# Patient Record
Sex: Female | Born: 1955 | Race: White | Hispanic: No | Smoking: Former smoker
Health system: Southern US, Community
[De-identification: ages and names within clinical notes are randomized; demographics above are authoritative.]

## PROBLEM LIST (undated history)

## (undated) DIAGNOSIS — H9190 Unspecified hearing loss, unspecified ear: Secondary | ICD-10-CM

## (undated) DIAGNOSIS — N816 Rectocele: Secondary | ICD-10-CM

## (undated) DIAGNOSIS — F319 Bipolar disorder, unspecified: Secondary | ICD-10-CM

## (undated) DIAGNOSIS — E039 Hypothyroidism, unspecified: Secondary | ICD-10-CM

## (undated) DIAGNOSIS — K5900 Constipation, unspecified: Secondary | ICD-10-CM

## (undated) DIAGNOSIS — F329 Major depressive disorder, single episode, unspecified: Secondary | ICD-10-CM

## (undated) DIAGNOSIS — G2581 Restless legs syndrome: Secondary | ICD-10-CM

## (undated) DIAGNOSIS — F32A Depression, unspecified: Secondary | ICD-10-CM

## (undated) HISTORY — PX: BLADDER SURGERY: SHX569

## (undated) HISTORY — DX: Unspecified hearing loss, unspecified ear: H91.90

## (undated) HISTORY — DX: Rectocele: N81.6

## (undated) HISTORY — DX: Constipation, unspecified: K59.00

## (undated) HISTORY — DX: Major depressive disorder, single episode, unspecified: F32.9

## (undated) HISTORY — DX: Restless legs syndrome: G25.81

## (undated) HISTORY — DX: Hypothyroidism, unspecified: E03.9

## (undated) HISTORY — DX: Depression, unspecified: F32.A

## (undated) HISTORY — PX: TUBAL LIGATION: SHX77

## (undated) HISTORY — PX: COLONOSCOPY: SHX174

---

## 1978-03-20 HISTORY — PX: BLADDER SURGERY: SHX569

## 1998-03-02 ENCOUNTER — Encounter: Payer: Self-pay | Admitting: Emergency Medicine

## 1998-03-02 ENCOUNTER — Emergency Department (HOSPITAL_COMMUNITY): Admission: EM | Admit: 1998-03-02 | Discharge: 1998-03-02 | Payer: Self-pay | Admitting: Emergency Medicine

## 1998-06-25 ENCOUNTER — Other Ambulatory Visit: Admission: RE | Admit: 1998-06-25 | Discharge: 1998-06-25 | Payer: Self-pay | Admitting: *Deleted

## 1999-09-09 ENCOUNTER — Encounter: Admission: RE | Admit: 1999-09-09 | Discharge: 1999-09-09 | Payer: Self-pay | Admitting: Family Medicine

## 1999-09-09 ENCOUNTER — Encounter: Payer: Self-pay | Admitting: Family Medicine

## 1999-10-14 ENCOUNTER — Emergency Department (HOSPITAL_COMMUNITY): Admission: EM | Admit: 1999-10-14 | Discharge: 1999-10-14 | Payer: Self-pay | Admitting: Emergency Medicine

## 1999-10-14 ENCOUNTER — Encounter: Payer: Self-pay | Admitting: Emergency Medicine

## 2000-03-08 ENCOUNTER — Other Ambulatory Visit: Admission: RE | Admit: 2000-03-08 | Discharge: 2000-03-08 | Payer: Self-pay | Admitting: Obstetrics and Gynecology

## 2000-10-10 ENCOUNTER — Encounter: Admission: RE | Admit: 2000-10-10 | Discharge: 2000-10-10 | Payer: Self-pay | Admitting: Family Medicine

## 2000-10-10 ENCOUNTER — Encounter: Payer: Self-pay | Admitting: Family Medicine

## 2001-06-07 ENCOUNTER — Other Ambulatory Visit: Admission: RE | Admit: 2001-06-07 | Discharge: 2001-06-07 | Payer: Self-pay | Admitting: Obstetrics & Gynecology

## 2001-11-29 ENCOUNTER — Encounter: Admission: RE | Admit: 2001-11-29 | Discharge: 2001-11-29 | Payer: Self-pay | Admitting: Family Medicine

## 2001-11-29 ENCOUNTER — Encounter: Payer: Self-pay | Admitting: Family Medicine

## 2002-08-22 ENCOUNTER — Other Ambulatory Visit: Admission: RE | Admit: 2002-08-22 | Discharge: 2002-08-22 | Payer: Self-pay | Admitting: Obstetrics & Gynecology

## 2003-01-02 ENCOUNTER — Encounter: Payer: Self-pay | Admitting: Family Medicine

## 2003-01-02 ENCOUNTER — Encounter: Admission: RE | Admit: 2003-01-02 | Discharge: 2003-01-02 | Payer: Self-pay | Admitting: Family Medicine

## 2003-09-11 ENCOUNTER — Ambulatory Visit (HOSPITAL_COMMUNITY): Admission: RE | Admit: 2003-09-11 | Discharge: 2003-09-11 | Payer: Self-pay | Admitting: Obstetrics & Gynecology

## 2003-09-11 ENCOUNTER — Encounter (INDEPENDENT_AMBULATORY_CARE_PROVIDER_SITE_OTHER): Payer: Self-pay | Admitting: Specialist

## 2004-07-15 ENCOUNTER — Encounter: Admission: RE | Admit: 2004-07-15 | Discharge: 2004-07-15 | Payer: Self-pay | Admitting: Obstetrics & Gynecology

## 2004-11-12 ENCOUNTER — Emergency Department (HOSPITAL_COMMUNITY): Admission: EM | Admit: 2004-11-12 | Discharge: 2004-11-12 | Payer: Self-pay | Admitting: Emergency Medicine

## 2005-07-21 ENCOUNTER — Encounter: Admission: RE | Admit: 2005-07-21 | Discharge: 2005-07-21 | Payer: Self-pay | Admitting: Obstetrics & Gynecology

## 2005-08-25 ENCOUNTER — Encounter: Admission: RE | Admit: 2005-08-25 | Discharge: 2005-08-25 | Payer: Self-pay | Admitting: Obstetrics & Gynecology

## 2006-07-27 ENCOUNTER — Encounter: Admission: RE | Admit: 2006-07-27 | Discharge: 2006-07-27 | Payer: Self-pay | Admitting: Obstetrics & Gynecology

## 2007-09-27 ENCOUNTER — Encounter: Admission: RE | Admit: 2007-09-27 | Discharge: 2007-09-27 | Payer: Self-pay | Admitting: Obstetrics & Gynecology

## 2007-10-11 ENCOUNTER — Encounter: Admission: RE | Admit: 2007-10-11 | Discharge: 2007-10-11 | Payer: Self-pay | Admitting: Obstetrics & Gynecology

## 2009-02-05 ENCOUNTER — Encounter: Admission: RE | Admit: 2009-02-05 | Discharge: 2009-02-05 | Payer: Self-pay | Admitting: Obstetrics & Gynecology

## 2009-08-11 ENCOUNTER — Emergency Department (HOSPITAL_COMMUNITY): Admission: EM | Admit: 2009-08-11 | Discharge: 2009-08-12 | Payer: Self-pay | Admitting: Emergency Medicine

## 2009-08-12 ENCOUNTER — Inpatient Hospital Stay (HOSPITAL_COMMUNITY): Admission: AD | Admit: 2009-08-12 | Discharge: 2009-08-16 | Payer: Self-pay | Admitting: Psychiatry

## 2009-08-12 ENCOUNTER — Ambulatory Visit: Payer: Self-pay | Admitting: Psychiatry

## 2010-01-07 ENCOUNTER — Ambulatory Visit (HOSPITAL_COMMUNITY): Admission: RE | Admit: 2010-01-07 | Discharge: 2010-01-07 | Payer: Self-pay | Admitting: Surgery

## 2010-02-11 ENCOUNTER — Encounter: Admission: RE | Admit: 2010-02-11 | Discharge: 2010-02-11 | Payer: Self-pay | Admitting: Obstetrics & Gynecology

## 2010-02-25 ENCOUNTER — Encounter
Admission: RE | Admit: 2010-02-25 | Discharge: 2010-02-25 | Payer: Self-pay | Source: Home / Self Care | Attending: Obstetrics & Gynecology | Admitting: Obstetrics & Gynecology

## 2010-04-10 ENCOUNTER — Encounter: Payer: Self-pay | Admitting: Obstetrics & Gynecology

## 2010-04-11 ENCOUNTER — Encounter: Payer: Self-pay | Admitting: Obstetrics & Gynecology

## 2010-05-27 ENCOUNTER — Other Ambulatory Visit: Payer: Self-pay | Admitting: Obstetrics & Gynecology

## 2010-06-01 LAB — DIFFERENTIAL
Eosinophils Absolute: 0.2 10*3/uL (ref 0.0–0.7)
Lymphocytes Relative: 37 % (ref 12–46)
Lymphs Abs: 2.5 10*3/uL (ref 0.7–4.0)
Monocytes Relative: 7 % (ref 3–12)
Neutrophils Relative %: 52 % (ref 43–77)

## 2010-06-01 LAB — COMPREHENSIVE METABOLIC PANEL
ALT: 14 U/L (ref 0–35)
AST: 25 U/L (ref 0–37)
Calcium: 9.8 mg/dL (ref 8.4–10.5)
GFR calc Af Amer: >60 mL/min (ref >60)
Sodium: 141 mEq/L (ref 135–145)
Total Protein: 6.9 g/dL (ref 6.0–8.3)

## 2010-06-01 LAB — CBC
MCHC: 33.9 g/dL (ref 30.0–36.0)
RDW: 13.1 % (ref 11.5–15.5)

## 2010-06-01 LAB — SURGICAL PCR SCREEN: MRSA, PCR: NEGATIVE

## 2010-06-06 LAB — BASIC METABOLIC PANEL
GFR calc Af Amer: >60 mL/min (ref >60)
GFR calc non Af Amer: >60 mL/min (ref >60)
Glucose, Bld: 106 mg/dL — ABNORMAL HIGH (ref 70–99)
Potassium: 3.9 mEq/L (ref 3.5–5.1)
Sodium: 142 mEq/L (ref 135–145)

## 2010-06-06 LAB — RAPID URINE DRUG SCREEN, HOSP PERFORMED
Amphetamines: NOT DETECTED
Benzodiazepines: NOT DETECTED
Cocaine: NOT DETECTED
Tetrahydrocannabinol: NOT DETECTED

## 2010-06-06 LAB — DIFFERENTIAL
Eosinophils Relative: 2 % (ref 0–5)
Lymphocytes Relative: 30 % (ref 12–46)
Lymphs Abs: 2.8 10*3/uL (ref 0.7–4.0)
Monocytes Absolute: 0.7 10*3/uL (ref 0.1–1.0)
Monocytes Relative: 7 % (ref 3–12)
Neutro Abs: 5.8 10*3/uL (ref 1.7–7.7)

## 2010-06-06 LAB — CBC
HCT: 44.8 % (ref 36.0–46.0)
Hemoglobin: 14.9 g/dL (ref 12.0–15.0)
RBC: 4.85 MIL/uL (ref 3.87–5.11)
RDW: 13.5 % (ref 11.5–15.5)
WBC: 9.5 10*3/uL (ref 4.0–10.5)

## 2010-06-06 LAB — TRICYCLICS SCREEN, URINE: TCA Scrn: NOT DETECTED

## 2010-08-05 NOTE — Op Note (Signed)
NAME:  Kara Webster, Kara Webster                          ACCOUNT NO.:  [id number]   MEDICAL RECORD NO.:  [id number]                   PATIENT TYPE:  AMB   LOCATION:  SDC                                  FACILITY:  [facility]   PHYSICIAN:  Genia Del, M.D.             DATE OF BIRTH:  [birth year]   DATE OF PROCEDURE:  09/11/2003  DATE OF DISCHARGE:                                 OPERATIVE REPORT   PREOPERATIVE DIAGNOSIS:  Menometrorrhagia with probable intrauterine  lesions.   POSTOPERATIVE DIAGNOSES:  1. Menometrorrhagia with probable intrauterine lesions.  2. Probable endometrial polyp.   SURGEON:  Genia Del, M.D.   ANESTHESIOLOGIST:  Quillian Quince, M.D.   PROCEDURE:  Hysteroscopy with resection of intrauterine lesion and D&C.   DESCRIPTION OF PROCEDURE:  Under general anesthesia with endotracheal  intubation, the patient is in the lithotomy position.  She is prepped with  Hibiclens on the suprapubic, vulvar and vaginal areas and draped as usual.  The vaginal examination reveals a retroverted uterus, normal volume, no  adnexal mass.  We insert the speculum inside the vagina.  The anterior lip  of the cervix is grasped with the tenaculum.  A paracervical block and  cervical block are done with Nesacaine 1% plain, a total of 20 mL, at 4 and  8 o'clock.  We then dilate the cervix up to Hegar #27 without difficulty.  A  diagnostic hysteroscopy is done showing a left low anterior polyp in the  intrauterine cavity.  It measures about 1 cm to 1.5 cm in diameter.  The  rest of the cavity is normal. The ostia are well seen and pictures are  taken.  We remove the diagnostic hysteroscope and complete dilatation with  Hegar dilators up to #35.  This is done without difficulty. We then insert  the operative hysteroscope inside the intrauterine cavity with a double  loop.  The intrauterine lesion is resected at the base and sent to  pathology.  We then visualize the intrauterine cavity  again, pictures are  taken.  The intrauterine cavity appears completely normal.  We then use a  sharp curette to proceed with a systematic curettage of all intrauterine  surfaces.  The specimen is sent separately to pathology.  The instruments  are then removed.  Hemostasis is good.  The estimated blood loss was  minimal.  The fluid deficit was 40 mL.  No complications occurred and the  patient was transferred to the recovery room in good condition.                                               Genia Del, M.D.    ML/MEDQ  D:  09/11/2003  T:  09/11/2003  Job:  [id number]

## 2011-01-26 ENCOUNTER — Other Ambulatory Visit: Payer: Self-pay | Admitting: Obstetrics & Gynecology

## 2011-01-26 DIAGNOSIS — Z1231 Encounter for screening mammogram for malignant neoplasm of breast: Secondary | ICD-10-CM

## 2011-03-03 ENCOUNTER — Ambulatory Visit
Admission: RE | Admit: 2011-03-03 | Discharge: 2011-03-03 | Disposition: A | Discharge status: Home / Self Care | Payer: BC Managed Care – PPO | Source: Ambulatory Visit | Attending: Obstetrics & Gynecology | Admitting: Obstetrics & Gynecology

## 2011-03-03 DIAGNOSIS — Z1231 Encounter for screening mammogram for malignant neoplasm of breast: Secondary | ICD-10-CM

## 2011-03-09 ENCOUNTER — Other Ambulatory Visit: Payer: Self-pay | Admitting: Obstetrics & Gynecology

## 2011-03-09 DIAGNOSIS — R928 Other abnormal and inconclusive findings on diagnostic imaging of breast: Secondary | ICD-10-CM

## 2011-03-24 ENCOUNTER — Ambulatory Visit
Admission: RE | Admit: 2011-03-24 | Discharge: 2011-03-24 | Disposition: A | Discharge status: Home / Self Care | Payer: BC Managed Care – PPO | Source: Ambulatory Visit | Attending: Obstetrics & Gynecology | Admitting: Obstetrics & Gynecology

## 2011-03-24 DIAGNOSIS — R928 Other abnormal and inconclusive findings on diagnostic imaging of breast: Secondary | ICD-10-CM

## 2012-04-02 ENCOUNTER — Other Ambulatory Visit: Payer: Self-pay | Admitting: Obstetrics & Gynecology

## 2012-04-02 DIAGNOSIS — Z1231 Encounter for screening mammogram for malignant neoplasm of breast: Secondary | ICD-10-CM

## 2012-04-05 ENCOUNTER — Ambulatory Visit
Admission: RE | Admit: 2012-04-05 | Discharge: 2012-04-05 | Disposition: A | Discharge status: Home / Self Care | Payer: BC Managed Care – PPO | Source: Ambulatory Visit | Attending: Obstetrics & Gynecology | Admitting: Obstetrics & Gynecology

## 2012-04-05 DIAGNOSIS — Z1231 Encounter for screening mammogram for malignant neoplasm of breast: Secondary | ICD-10-CM

## 2013-03-12 ENCOUNTER — Other Ambulatory Visit: Payer: Self-pay

## 2013-03-12 DIAGNOSIS — Z1231 Encounter for screening mammogram for malignant neoplasm of breast: Secondary | ICD-10-CM

## 2013-04-11 ENCOUNTER — Ambulatory Visit
Admission: RE | Admit: 2013-04-11 | Discharge: 2013-04-11 | Disposition: A | Discharge status: Home / Self Care | Payer: BC Managed Care – PPO | Source: Ambulatory Visit

## 2013-04-11 DIAGNOSIS — Z1231 Encounter for screening mammogram for malignant neoplasm of breast: Secondary | ICD-10-CM

## 2013-05-27 ENCOUNTER — Ambulatory Visit (INDEPENDENT_AMBULATORY_CARE_PROVIDER_SITE_OTHER): Payer: BC Managed Care – PPO | Admitting: Internal Medicine

## 2013-05-27 VITALS — BP 120/78 | HR 70 | Temp 97.5°F | Resp 18

## 2013-05-27 DIAGNOSIS — H538 Other visual disturbances: Secondary | ICD-10-CM

## 2013-05-27 DIAGNOSIS — F1911 Other psychoactive substance abuse, in remission: Secondary | ICD-10-CM

## 2013-05-27 DIAGNOSIS — F39 Unspecified mood [affective] disorder: Secondary | ICD-10-CM

## 2013-05-27 DIAGNOSIS — R51 Headache: Secondary | ICD-10-CM

## 2013-05-27 DIAGNOSIS — R112 Nausea with vomiting, unspecified: Secondary | ICD-10-CM

## 2013-05-27 DIAGNOSIS — R1013 Epigastric pain: Secondary | ICD-10-CM

## 2013-05-27 DIAGNOSIS — F1910 Other psychoactive substance abuse, uncomplicated: Secondary | ICD-10-CM

## 2013-05-27 LAB — POCT CBC
GRANULOCYTE PERCENT: 72.8 % (ref 37–80)
HCT, POC: 41.7 % (ref 37.7–47.9)
HEMOGLOBIN: 13.1 g/dL (ref 12.2–16.2)
LYMPH, POC: 1.8 (ref 0.6–3.4)
MCH, POC: 27.9 pg (ref 27–31.2)
MCHC: 31.4 g/dL — AB (ref 31.8–35.4)
MCV: 88.8 fL (ref 80–97)
MID (cbc): 0.5 (ref 0–0.9)
MPV: 7.5 fL (ref 0–99.8)
POC GRANULOCYTE: 6 (ref 2–6.9)
POC LYMPH PERCENT: 21.7 %L (ref 10–50)
POC MID %: 5.5 % (ref 0–12)
Platelet Count, POC: 430 10*3/uL — AB (ref 142–424)
RBC: 4.7 M/uL (ref 4.04–5.48)
RDW, POC: 14.7 %
WBC: 8.3 10*3/uL (ref 4.6–10.2)

## 2013-05-27 LAB — COMPREHENSIVE METABOLIC PANEL
ALBUMIN: 4.2 g/dL (ref 3.5–5.2)
ALT: 8 U/L (ref 0–35)
AST: 19 U/L (ref 0–37)
Alkaline Phosphatase: 62 U/L (ref 39–117)
BUN: 12 mg/dL (ref 6–23)
CHLORIDE: 101 meq/L (ref 96–112)
CO2: 31 meq/L (ref 19–32)
CREATININE: 0.75 mg/dL (ref 0.50–1.10)
Calcium: 9.8 mg/dL (ref 8.4–10.5)
Glucose, Bld: 102 mg/dL — ABNORMAL HIGH (ref 70–99)
POTASSIUM: 4.8 meq/L (ref 3.5–5.3)
Sodium: 141 mEq/L (ref 135–145)
Total Bilirubin: 0.5 mg/dL (ref 0.2–1.2)
Total Protein: 6.8 g/dL (ref 6.0–8.3)

## 2013-05-27 LAB — POCT UA - MICROSCOPIC ONLY
CASTS, UR, LPF, POC: NEGATIVE
Crystals, Ur, HPF, POC: NEGATIVE
Mucus, UA: NEGATIVE
WBC, Ur, HPF, POC: NEGATIVE
Yeast, UA: NEGATIVE

## 2013-05-27 LAB — POCT URINALYSIS DIPSTICK
Bilirubin, UA: NEGATIVE
Blood, UA: NEGATIVE
GLUCOSE UA: NEGATIVE
KETONES UA: NEGATIVE
LEUKOCYTES UA: NEGATIVE
NITRITE UA: NEGATIVE
Protein, UA: NEGATIVE
Spec Grav, UA: 1.01
Urobilinogen, UA: 0.2
pH, UA: 7

## 2013-05-27 LAB — GLUCOSE, POCT (MANUAL RESULT ENTRY): POC Glucose: 113 mg/dl — AB (ref 70–99)

## 2013-05-27 LAB — TSH: TSH: 2.184 u[IU]/mL (ref 0.350–4.500)

## 2013-05-27 MED ORDER — ONDANSETRON HCL 4 MG PO TABS: 4.0000 mg | ORAL_TABLET | Freq: Three times a day (TID) | ORAL | Status: DC | PRN

## 2013-05-27 MED ORDER — LAMOTRIGINE 100 MG PO TABS: 100.0000 mg | ORAL_TABLET | Freq: Every day | ORAL | Status: DC

## 2013-05-27 NOTE — Progress Notes (Addendum)
Subjective:   This chart was scribed for Kara Lin, MD, by Neta Ehlers, scribe. The pt's care was started at 12:27 pm.    Patient ID: Kara Webster, female    DOB: 30-Apr-1955, 58 y.o.   MRN: [id number]  Chief Complaint  Patient presents with   Headache    very bad   Abdominal Pain    epigastric   Chills  Called to see patient urgently due to unusual symptoms   HPI  Kara Webster is a 58 y.o. female who presents to [facility] complaining of abdominal pain which began three days ago. She characterizes the abdominal pain as "knife-like." She also experienced one day of nausea and emesis as well as  diarrhea. Additionally, she has experienced night sweats. The emesis and diarrhea have resolved, but the abdominal pain and nausea continue. Aside from the episode of diarrhea, she has had regular BMs.   She also has experienced a constant, generalized headache which also began three days ago. The headache is associated with blurred vision, altho she was able to drive without problems to get here.  She denies a stiff neck, fever, cough, SOB, dysuria, difficulty urinating, rash, or otalgia.   Pt takes Naltrexone, Abilify ,Cymbalta, Lamictal, Estradiol, and Progesterone/estradiol. She has not had any new medications;  Lajuana Ripple prescribes her medications. Naltrexone has helped her avoid substances for several years. She has recently renewed Lamictal last week and Naltrexone last month; she has recently switched pharmacies. She reports both medications looked different with the renewals.   The pt ate crackers earlier today; the pain was not worsened with eating. She has not used antacids to treat the pain.   Two weeks ago, she took castor beans. She denies experiencing any immediate effect after taking the castor beans.   She uses [facility] and is due for an eye exam.  Review of Systems  Constitutional: Positive for appetite change. Negative for fever, chills,  activity change and unexpected weight change.       Night sweats  HENT: Negative for ear pain, sore throat and trouble swallowing.   Eyes: Positive for visual disturbance. Negative for photophobia and redness.  Respiratory: Negative for cough and shortness of breath.   Cardiovascular: Negative for chest pain, palpitations and leg swelling.  Gastrointestinal: Positive for nausea, vomiting, abdominal pain and diarrhea. Negative for constipation.  Genitourinary: Negative for dysuria, frequency, difficulty urinating and menstrual problem.       [name]  Musculoskeletal: Negative for joint swelling, myalgias, neck pain and neck stiffness.  Neurological: Positive for headaches. Negative for dizziness, tremors and weakness.  Psychiatric/Behavioral: Positive for sleep disturbance and dysphoric mood. Negative for confusion. The patient is nervous/anxious.       Objective:   Physical Exam  Nursing note and vitals reviewed. Constitutional: She is oriented to person, place, and time. She appears well-developed and well-nourished. No distress.  HENT:  Head: Normocephalic and atraumatic.  Right Ear: External ear normal.  Left Ear: External ear normal.  Nose: Nose normal.  Mouth/Throat: Oropharynx is clear and moist.  Eyes: Conjunctivae and EOM are normal. Pupils are equal, round, and reactive to light.  Neck: Normal range of motion. Neck supple. No thyromegaly present.  Cardiovascular: Normal rate, regular rhythm, normal heart sounds and intact distal pulses.   No murmur heard. Pulmonary/Chest: Effort normal. No respiratory distress. She exhibits no tenderness.  Abdominal: Soft. Bowel sounds are normal. She exhibits no mass. There is no guarding.  Tender in the epigastrium,  but no rebound.   Musculoskeletal: Normal range of motion. She exhibits no edema.  Lymphadenopathy:    She has no cervical adenopathy.  Neurological: She is alert and oriented to person, place, and time. She has normal  reflexes. No cranial nerve deficit. Coordination normal.  Fundi reveal no papilledema. Cranial nerves 2-12 intact. Visual field confrontation normal. Rhomberg negative. Deep tendon reflexes symmetric. No peripheral motor or sensory loss. No nystagmus with movement.   Skin: Skin is warm and dry. No rash noted.  Psychiatric: She has a normal mood and affect. Her behavior is normal. Judgment and thought content normal.   gait normal  Triage Vitals: BP 120/78   Pulse 70   Temp(Src) 97.5 F (36.4 C) (Oral)   Resp 18   SpO2 99% Glucose normal CBC within normal limits    Assessment & Plan:   12:45 PM- Discussed treatment plan with patient, which includes lab work, and the patient agreed to the plan.   1:12 PM- Rechecked pt.   1:31 PM- Rechecked pt's vision. She reports right is more blurred than left. Vision 20/20. Fundi benign  Headache(784.0) - Plan: POCT CBC, POCT urinalysis dipstick, POCT UA - Microscopic Only, Comprehensive metabolic panel  Abdominal pain, epigastric - Plan: POCT CBC, POCT urinalysis dipstick, POCT UA - Microscopic Only, Comprehensive metabolic panel  Nausea with vomiting - Plan: POCT CBC, POCT urinalysis dipstick, POCT UA - Microscopic Only, Comprehensive metabolic panel, ondansetron (ZOFRAN) 4 MG tablet  Blurred vision - Plan: POCT CBC, POCT urinalysis dipstick, POCT UA - Microscopic Only, Comprehensive metabolic panel, TSH, POCT glucose (manual entry)  Episodic mood disorder  History of substance abuse  In retrospect she believes her symptoms started soon after her change in the generic form of her Lamictal She also possibly could have an acute viral gastroenteritis as well  For now we will treat with Zofran, and have her resume the former generic Lamictal for the next 2 weeks She'll go ahead with ophthalmology evaluation Use Tylenol for headache Followup here immediately if worse   Addendum Labs Results for orders placed in visit on 05/27/13    COMPREHENSIVE METABOLIC PANEL      Result Value Ref Range   Sodium 141  135 - 145 mEq/L   Potassium 4.8  3.5 - 5.3 mEq/L   Chloride 101  96 - 112 mEq/L   CO2 31  19 - 32 mEq/L   Glucose, Bld 102 (*) 70 - 99 mg/dL   BUN 12  6 - 23 mg/dL   Creat 0.75  0.50 - 1.10 mg/dL   Total Bilirubin 0.5  0.2 - 1.2 mg/dL   Alkaline Phosphatase 62  39 - 117 U/L   AST 19  0 - 37 U/L   ALT 8  0 - 35 U/L   Total Protein 6.8  6.0 - 8.3 g/dL   Albumin 4.2  3.5 - 5.2 g/dL   Calcium 9.8  8.4 - 10.5 mg/dL  TSH      Result Value Ref Range   TSH 2.184  0.350 - 4.500 uIU/mL  POCT CBC      Result Value Ref Range   WBC 8.3  4.6 - 10.2 K/uL   Lymph, poc 1.8  0.6 - 3.4   POC LYMPH PERCENT 21.7  10 - 50 %L   MID (cbc) 0.5  0 - 0.9   POC MID % 5.5  0 - 12 %M   POC Granulocyte 6.0  2 - 6.9   Granulocyte percent  72.8  37 - 80 %G   RBC 4.70  4.04 - 5.48 M/uL   Hemoglobin 13.1  12.2 - 16.2 g/dL   HCT, POC 41.7  37.7 - 47.9 %   MCV 88.8  80 - 97 fL   MCH, POC 27.9  27 - 31.2 pg   MCHC 31.4 (*) 31.8 - 35.4 g/dL   RDW, POC 14.7     Platelet Count, POC 430 (*) 142 - 424 K/uL   MPV 7.5  0 - 99.8 fL  POCT URINALYSIS DIPSTICK      Result Value Ref Range   Color, UA yellow     Clarity, UA clear     Glucose, UA neg     Bilirubin, UA neg     Ketones, UA neg     Spec Grav, UA 1.010     Blood, UA neg     pH, UA 7.0     Protein, UA neg     Urobilinogen, UA 0.2     Nitrite, UA neg     Leukocytes, UA Negative    POCT UA - MICROSCOPIC ONLY      Result Value Ref Range   WBC, Ur, HPF, POC neg     RBC, urine, microscopic 0-1     Bacteria, U Microscopic 1+     Mucus, UA neg     Epithelial cells, urine per micros 0-2     Crystals, Ur, HPF, POC neg     Casts, Ur, LPF, POC neg     Yeast, UA neg    GLUCOSE, POCT (MANUAL RESULT ENTRY)      Result Value Ref Range   POC Glucose 113 (*) 70 - 99 mg/dl   50 minute office visit

## 2013-05-28 ENCOUNTER — Encounter: Payer: Self-pay | Admitting: Internal Medicine

## 2013-05-28 DIAGNOSIS — F1911 Other psychoactive substance abuse, in remission: Secondary | ICD-10-CM | POA: Insufficient documentation

## 2013-05-28 DIAGNOSIS — F39 Unspecified mood [affective] disorder: Secondary | ICD-10-CM | POA: Insufficient documentation

## 2013-09-23 ENCOUNTER — Ambulatory Visit (INDEPENDENT_AMBULATORY_CARE_PROVIDER_SITE_OTHER): Payer: BC Managed Care – PPO | Admitting: General Surgery

## 2013-10-07 ENCOUNTER — Encounter (INDEPENDENT_AMBULATORY_CARE_PROVIDER_SITE_OTHER): Payer: Self-pay | Admitting: General Surgery

## 2013-10-07 ENCOUNTER — Ambulatory Visit (INDEPENDENT_AMBULATORY_CARE_PROVIDER_SITE_OTHER): Payer: BC Managed Care – PPO | Admitting: General Surgery

## 2013-10-07 VITALS — BP 124/77 | HR 80 | Temp 98.6°F | Resp 16 | Ht 64.0 in | Wt 154.2 lb

## 2013-10-07 DIAGNOSIS — K5900 Constipation, unspecified: Secondary | ICD-10-CM | POA: Insufficient documentation

## 2013-10-07 DIAGNOSIS — R150 Incomplete defecation: Secondary | ICD-10-CM

## 2013-10-07 NOTE — Patient Instructions (Addendum)
Continue a high fiber diet.  Recommend colonoscopy for colorectal cancer screening.    Bowel Incontinence  What is incontinence? Incontinence is the impaired ability to control gas or stool. Its severity ranges from mild difficulty with gas control to severe loss of control over liquid and formed stools. Incontinence to stool is a common problem, but often it is not discussed due to embarassment. What causes incontinence? There are many causes of incontinence. Injury during childbirth is one of the most common causes. These injuries may cause a tear in the anal muscles. The nerves supplying the anal muscles may also be injured. While some injuries may be recognized immediately following childbirth, many others may go unnoticed and not become a problem until later in life. In these situations, a prior childbirth may not be recognized as the cause of incontinence. Anal operations or traumatic injury to the tissue surrounding the anal region similarly can damage the anal muscles and hinder bowel control. Some individuals experience loss of strength in the anal muscles as they age. As a result, a minor control problem in a younger person may become more significant later in life. Diarrhea may be associated with a feeling of urgency or stool leakage due to the frequent liquid stools passing through the anal opening. If bleeding accompanies your bowel movements or you have lack of bowel control, consult your physician. These symptoms may indicate inflammation within the colon (colitis), a rectal tumor or rectal prolapse - all conditions that require prompt evaluation by a physician.    How is the cause of incontinence determined? An initial discussion of the problem with your physician will help establish the degree of control difficulty and its impact on your lifestyle. Many clues to the origin of incontinence may be found in patient histories. For example, a woman's history of past childbirths is very  important. Multiple pregnancies, large weight babies, forceps deliveries, or episiotomies may contribute to muscle or nerve injury at the time of childbirth. In some cases, medical illnesses and medications play a role in problems with control. A physical exam of the anal region should be performed. It may readily identify an obvious injury to the anal muscles. In addition, an ultrasound probe can be used within the anal area to provide a picture of the muscles and show areas in which the anal muscles have been injured. Frequently, additional studies are required to define the anal area more completely. In a test called anal manometry, a small catheter is placed into the anus to record pressure as patients relax and tighten the anal muscles. This test can demonstrate how weak or strong the muscle really is. A separate test may also be conducted to determine if the nerves that go to the anal muscles are functioning properly. What can be done to correct the problem? Treatment of incontinence may include: Marland Kitchen     Dietary changes .     Constipating medications .     Muscle strengthening exercises .     Biofeedback  Injectable bulking agents  Surgical muscle repair  Artificial anal sphincter  Sacral nerve stimulator  After a careful history, physical examination and testing to determine the cause and severity of the problem, treatment can be addressed. Mild problems may be treated very simply with dietary changes and the use of some constipating medications. Diseases which cause inflammation in the rectum, such as colitis, may contribute to anal control problems. Treating these diseases also may eliminate or improve symptoms of incontinence. Sometimes a change  in prescribed medications may help. Your physician also may recommend simple home exercises that may strengthen the anal muscles to help in mild cases. A type of physical therapy called biofeedback can be used to help patients sense when stool is  ready to be evacuated and help strengthen the muscles. Injuries to the anal muscles may be repaired with surgery. Some individuals may benefit from a technique that delivers electrical energy to the skin and muscles surrounding the anus which results in firming and thickening of this area to help with continence. In certain individuals that have nerve damage or anal muscles that are damaged beyond repair, an artificial sphincter may be implanted. The artificial sphincter is a plastic, fluid filled doughnut that is surgically implanted around the damaged anal sphincter. This artificial sphincter keeps the anal canal closed. When an individual wants to have a bowel movement, the fluid can be pumped out of the doughnut to allow the anal canal to open. In extreme cases, patients may find that a colostomy is the best option for improving their quality of life. What is a colon and rectal surgeon? Colon and rectal surgeons are experts in the surgical and non-surgical treatment of diseases of the colon, rectum and anus. They have completed advanced surgical training in the treatment of these diseases as well as full general surgical training. Board-certified colon and rectal surgeons complete residencies in general surgery and colon and rectal surgery, and pass intensive examinations conducted by the American Board of Surgery and the American Board of Colon and Rectal Surgery. They are well-versed in the treatment of both benign and malignant diseases of the colon, rectum and anus and are able to perform routine screening examinations and surgically treat conditions if indicated to do so.  2012 American Society of Colon & Rectal Surgeons   Fiber Chart  You should 25-30g of fiber per day and drinking 8 glasses of water to help your bowels move regularly.  In the chart below you can look up how much fiber you are getting in an average day.  If you are not getting enough fiber, you should add a fiber supplement to  your diet.  Examples of this include Metamucil, FiberCon and Citrucel.  These can be purchased at your local grocery store or pharmacy.      [email or web address]-guerrero.[email or web address]

## 2013-10-07 NOTE — Progress Notes (Signed)
Chief Complaint  Patient presents with  . Rectal Problems    HISTORY: Kara Webster is a 58 y.o. female who presents to the office with fecal incontinence.  This had been occurring for about a year.   Constipation makes the symptoms worse.  She has a feeling of incomplete evacuation.  It is intermittent in nature.  her bowel habits are somewhat irregular and her bowel movements are sometimes hard.  her fiber intake is dietary.  her last colonoscopy was about 5 years ago in [place].  she has had 2 vaginal deliveries, but denies any birth trauma.  she is incontinent to soft stool but not air.  History reviewed. No pertinent past medical history.    Past Surgical History  Procedure Laterality Date  . Bladder surgery          Current Outpatient Prescriptions  Medication Sig Dispense Refill  . ARIPiprazole (ABILIFY) 2 MG tablet Take 2 mg by mouth daily.      . DULoxetine (CYMBALTA) 60 MG capsule Take 60 mg by mouth daily.      Marland Kitchen estradiol (VIVELLE-DOT) 0.075 MG/24HR Place 1 patch onto the skin 2 (two) times a week.      . lamoTRIgine (LAMICTAL) 100 MG tablet Take 1 tablet (100 mg total) by mouth daily.  14 tablet  0  . NALTREXONE HCL PO Take 1 tablet by mouth daily.      . progesterone (PROMETRIUM) 100 MG capsule Take 100 mg by mouth daily.       No current facility-administered medications for this visit.      Allergies  Allergen Reactions  . Codeine Nausea And Vomiting      History reviewed. No pertinent family history.  History   Social History  . Marital Status: Divorced    Spouse Name: N/A    Number of Children: N/A  . Years of Education: N/A   Social History Main Topics  . Smoking status: Never Smoker   . Smokeless tobacco: None  . Alcohol Use: No  . Drug Use: No  . Sexual Activity: None   Other Topics Concern  . None   Social History Narrative  . None      REVIEW OF SYSTEMS - PERTINENT POSITIVES ONLY: Review of Systems - General ROS: negative for -  chills, fever or weight loss Hematological and Lymphatic ROS: negative for - bleeding problems, blood clots or bruising Respiratory ROS: no cough, shortness of breath, or wheezing Cardiovascular ROS: no chest pain or dyspnea on exertion Gastrointestinal ROS: no abdominal pain, change in bowel habits, or black or bloody stools Genito-Urinary ROS: no dysuria, trouble voiding, or hematuria  EXAM: Filed Vitals:   10/07/13 1113  BP: 124/77  Pulse: 80  Temp: 98.6 F (37 C)  Resp: 16    General appearance: alert and cooperative Resp: clear to auscultation bilaterally Cardio: regular rate and rhythm GI: normal findings: soft, non-tender   Procedure: Anoscopy Surgeon: Marcello Moores Diagnosis: fecal incontinence   Assistant: McPhearson After the risks and benefits were explained, verbal consent was obtained for above procedure  Anesthesia: none Findings: minimal hemorrhoid disease, good resting tone, weak squeeze (esp anteriorly), normal push, mild rectocele    ASSESSMENT AND PLAN: Kara Webster is a 58 y.o. F with some incomplete defecation after episodes of constipation.  Her symptoms are not present when she is not constipated.  I have asked her to treat this with the addition of fiber to her diet with a goal of 25g  of fiber a day.  She will return to the office if things worsen.  I encouraged her to get her repeat colonoscopy scheduled.      Rosario Adie, MD Colon and Rectal Surgery / [facility]      Visit Diagnoses: No diagnosis found.  Primary Care Physician: No primary provider on file.

## 2015-07-19 ENCOUNTER — Encounter: Payer: Self-pay | Admitting: Physician Assistant

## 2015-07-19 ENCOUNTER — Ambulatory Visit (INDEPENDENT_AMBULATORY_CARE_PROVIDER_SITE_OTHER): Payer: PRIVATE HEALTH INSURANCE | Admitting: Physician Assistant

## 2015-07-19 VITALS — BP 116/57 | HR 79 | Temp 98.5°F | Ht 64.0 in | Wt 157.0 lb

## 2015-07-19 DIAGNOSIS — R5383 Other fatigue: Secondary | ICD-10-CM | POA: Diagnosis not present

## 2015-07-19 DIAGNOSIS — N951 Menopausal and female climacteric states: Secondary | ICD-10-CM | POA: Insufficient documentation

## 2015-07-19 DIAGNOSIS — J029 Acute pharyngitis, unspecified: Secondary | ICD-10-CM | POA: Diagnosis not present

## 2015-07-19 LAB — POCT RAPID STREP A (OFFICE): Rapid Strep A Screen: NEGATIVE

## 2015-07-19 NOTE — Progress Notes (Signed)
   Subjective:    Patient ID: Kara Webster, female    DOB: [birth year], 58 y.o.   MRN: [id number]  HPI  Pt is a 60 yo female who presents to the clinic to establish care. She woke up yesterday in the middle of the night with ST. She feels like she is swallowing glass. Today she tried dayquil and Robitussin with some benefit. Denies any fever. She does have some light body aches and fatigue. She has a cough that is not productive. No SOB or wheezing. She does have a headache.    .. Active Ambulatory Problems    Diagnosis Date Noted  . Episodic mood disorder ([place]) 05/28/2013  . History of substance abuse 05/28/2013  . Incomplete defecation 10/07/2013   Resolved Ambulatory Problems    Diagnosis Date Noted  . No Resolved Ambulatory Problems   Past Medical History  Diagnosis Date  . Depression    .Marland Kitchen Family History  Problem Relation Age of Onset  . Cancer Mother   . Hypertension Mother   . Cancer Father   . Diabetes Sister   . Hyperlipidemia Sister   . Hypertension Sister   . Depression Brother   . Diabetes Brother   . Hypertension Brother   . Alcohol abuse Maternal Uncle   . Alcohol abuse Paternal Aunt   . Alcohol abuse Maternal Grandfather    .Marland Kitchen Social History   Social History  . Marital Status: Divorced    Spouse Name: N/A  . Number of Children: N/A  . Years of Education: N/A   Occupational History  . Not on file.   Social History Main Topics  . Smoking status: Never Smoker   . Smokeless tobacco: Not on file  . Alcohol Use: No  . Drug Use: No  . Sexual Activity: No   Other Topics Concern  . Not on file   Social History Narrative       Review of Systems    see HPI.  Objective:   Physical Exam  Constitutional: She is oriented to person, place, and time. She appears well-developed and well-nourished.  HENT:  Head: Normocephalic and atraumatic.  Right Ear: External ear normal.  Left Ear: External ear normal.  Nose: Nose normal.  TM's clear  bilaterally.  Oropharynx erythematous without tonisilar swelling or exudate.   Eyes: Conjunctivae are normal. Right eye exhibits no discharge. Left eye exhibits no discharge.  Neck: Normal range of motion. Neck supple.  Tender bilateral anterior cervical adenopathy.   Cardiovascular: Normal rate, regular rhythm and normal heart sounds.   Pulmonary/Chest: Effort normal and breath sounds normal. She has no wheezes.  Lymphadenopathy:    She has cervical adenopathy.  Neurological: She is alert and oriented to person, place, and time.  Psychiatric: She has a normal mood and affect. Her behavior is normal.          Assessment & Plan:  Acute pharyngitis- discussed symptomatic care. HO given. Continue dayquil, robatussin, gargle with salt water, ibuprofen. Rapid strep negative. Follow up as needed or if symptoms worsen.

## 2015-07-19 NOTE — Patient Instructions (Signed)

## 2016-01-14 ENCOUNTER — Ambulatory Visit: Admit: 2016-01-14 | Payer: PRIVATE HEALTH INSURANCE | Admitting: Obstetrics & Gynecology

## 2016-01-14 SURGERY — COLPORRHAPHY, POSTERIOR, FOR RECTOCELE REPAIR
Anesthesia: General

## 2017-03-16 ENCOUNTER — Other Ambulatory Visit: Payer: Self-pay | Admitting: *Deleted

## 2017-04-12 ENCOUNTER — Other Ambulatory Visit: Payer: Self-pay | Admitting: Obstetrics & Gynecology

## 2017-04-12 ENCOUNTER — Other Ambulatory Visit: Payer: Self-pay

## 2017-06-01 ENCOUNTER — Ambulatory Visit (INDEPENDENT_AMBULATORY_CARE_PROVIDER_SITE_OTHER): Payer: PRIVATE HEALTH INSURANCE | Admitting: Obstetrics & Gynecology

## 2017-06-01 ENCOUNTER — Encounter: Payer: Self-pay | Admitting: Obstetrics & Gynecology

## 2017-06-01 VITALS — BP 124/72 | Ht 63.0 in | Wt 164.0 lb

## 2017-06-01 DIAGNOSIS — Z79890 Hormone replacement therapy (postmenopausal): Secondary | ICD-10-CM

## 2017-06-01 DIAGNOSIS — Z13820 Encounter for screening for osteoporosis: Secondary | ICD-10-CM

## 2017-06-01 DIAGNOSIS — R3915 Urgency of urination: Secondary | ICD-10-CM | POA: Diagnosis not present

## 2017-06-01 DIAGNOSIS — Z01419 Encounter for gynecological examination (general) (routine) without abnormal findings: Secondary | ICD-10-CM | POA: Diagnosis not present

## 2017-06-01 MED ORDER — PROGESTERONE MICRONIZED 100 MG PO CAPS: 100.0000 mg | ORAL_CAPSULE | Freq: Every day | ORAL | 4 refills | Status: DC

## 2017-06-01 MED ORDER — ESTRADIOL 0.5 MG PO TABS: 0.5000 mg | ORAL_TABLET | Freq: Every day | ORAL | 4 refills | Status: DC

## 2017-06-01 NOTE — Patient Instructions (Signed)
1. Encounter for routine gynecological examination with Papanicolaou smear of cervix Normal gynecologic exam.  Pap reflex done.  Breast exam normal.  Will schedule screening mammogram at the breast center.  Health labs with family physician.  Colonoscopy 7 years ago.  2. Post-menopause on HRT (hormone replacement therapy) Ran out of medication for hormone replacement therapy a month ago.  Very symptomatic with vasomotor symptoms, mainly hot flashes.  Also complaining of increased muscle aches and anxiety since ran out of hormones.  Wants to start back on it.  Will obtain medical records from [place], but per patient has been on it for about 10 years.  Patient informed of the increased risk of breast cancer after 10 years on hormone replacement therapy.  Also discussed the risk of blood clots and strokes increasing with age on hormone replacement therapy.  Decision by patient to continue on hormone replacement therapy with estradiol 0.5 mg tablet daily and Prometrium 100 mg tablet at bedtime.  In the course of the year, will try to wean the estradiol progressively.  3. Screening for osteoporosis Vitamin D supplements, calcium rich nutrition and regular weightbearing physical activity recommended.  Schedule bone density here. - DG Bone Density; Future  4. Urinary urgency Will try decreasing the caffeine consumption and increasing water intake.  Will also avoid overfilling of the bladder by going to the bathroom more regularly.  If worsening urinary urgency, patient will call back for referral to urology.  Other orders - estradiol (ESTRACE) 0.5 MG tablet; Take 1 tablet (0.5 mg total) by mouth daily. - progesterone (PROMETRIUM) 100 MG capsule; Take 1 capsule (100 mg total) by mouth at bedtime.  Kara Webster, it was a pleasure seeing you today!  I will inform you of your results as soon as they are available.   Menopause and Hormone Replacement Therapy What is hormone replacement therapy? Hormone  replacement therapy (HRT) is the use of artificial (synthetic) hormones to replace hormones that your body stops producing during menopause. Menopause is the normal time of life when menstrual periods stop completely and the ovaries stop producing the female hormones estrogen and progesterone. This lack of hormones can affect your health and cause undesirable symptoms. HRT can relieve some of those symptoms. What are my options for HRT? HRT may consist of the synthetic hormones estrogen and progestin, or it may consist of only estrogen (estrogen-only therapy). You and your health care provider will decide which form of HRT is best for you. If you choose to be on HRT and you have a uterus, estrogen and progestin are usually prescribed. Estrogen-only therapy is used for women who do not have a uterus. Possible options for taking HRT include:  Pills.  Patches.  Gels.  Sprays.  Vaginal cream.  Vaginal rings.  Vaginal inserts.  The amount of hormone(s) that you take and how long you take the hormone(s) varies depending on your individual health. It is important to:  Begin HRT with the lowest possible dosage.  Stop HRT as soon as your health care provider tells you to stop.  Work with your health care provider so that you feel informed and comfortable with your decisions.  What are the benefits of HRT? HRT can reduce the frequency and severity of menopausal symptoms. Benefits of HRT vary depending on the menopausal symptoms that you have, the severity of your symptoms, and your overall health. HRT may help to improve the following menopausal symptoms:  Hot flashes and night sweats. These are sudden feelings of heat  that spread over the face and body. The skin may turn red, like a blush. Night sweats are hot flashes that happen while you are sleeping or trying to sleep.  Bone loss (osteoporosis). The body loses calcium more quickly after menopause, causing the bones to become weaker. This  can increase the risk for bone breaks (fractures).  Vaginal dryness. The lining of the vagina can become thin and dry, which can cause pain during sexual intercourse or cause infection, burning, or itching.  Urinary tract infections.  Urinary incontinence. This is a decreased ability to control when you urinate.  Irritability.  Short-term memory problems.  What are the risks of HRT? Risks of HRT vary depending on your individual health and medical history. Risks of HRT also depend on whether you receive both estrogen and progestin or you receive estrogen only.HRT may increase the risk of:  Spotting. This is when a small amount of bloodleaks from the vagina unexpectedly.  Endometrial cancer. This cancer is in the lining of the uterus (endometrium).  Breast cancer.  Increased density of breast tissue. This can make it harder to find breast cancer on a breast X-ray (mammogram).  Stroke.  Heart attack.  Blood clots.  Gallbladder disease.  Risks of HRT can increase if you have any of the following conditions:  Endometrial cancer.  Liver disease.  Heart disease.  Breast cancer.  History of blood clots.  History of stroke.  How should I care for myself while I am on HRT?  Take over-the-counter and prescription medicines only as told by your health care provider.  Get mammograms, pelvic exams, and medical checkups as often as told by your health care provider.  Have Pap tests done as often as told by your health care provider. A Pap test is sometimes called a Pap smear. It is a screening test that is used to check for signs of cancer of the cervix and vagina. A Pap test can also identify the presence of infection or precancerous changes. Pap tests may be done: ? Every 3 years, starting at age 24. ? Every 5 years, starting after age 32, in combination with testing for human papillomavirus (HPV). ? More often or less often depending on other medical conditions you have,  your age, and other risk factors.  It is your responsibility to get your Pap test results. Ask your health care provider or the department performing the test when your results will be ready.  Keep all follow-up visits as told by your health care provider. This is important. When should I seek medical care? Talk with your health care provider if:  You have any of these: ? Pain or swelling in your legs. ? Shortness of breath. ? Chest pain. ? Lumps or changes in your breasts or armpits. ? Slurred speech. ? Pain, burning, or bleeding when you urine.  You develop any of these: ? Unusual vaginal bleeding. ? Dizziness or headaches. ? Weakness or numbness in any part of your arms or legs. ? Pain in your abdomen.  This information is not intended to replace advice given to you by your health care provider. Make sure you discuss any questions you have with your health care provider. Document Released: 12/03/2002 Document Revised: 02/01/2016 Document Reviewed: 09/07/2014 Elsevier Interactive Patient Education  2017 [employer] Maintenance for Postmenopausal Women Menopause is a normal process in which your reproductive ability comes to an end. This process happens gradually over a span of months to years, usually between the  ages of 19 and 68. Menopause is complete when you have missed 12 consecutive menstrual periods. It is important to talk with your health care provider about some of the most common conditions that affect postmenopausal women, such as heart disease, cancer, and bone loss (osteoporosis). Adopting a healthy lifestyle and getting preventive care can help to promote your health and wellness. Those actions can also lower your chances of developing some of these common conditions. What should I know about menopause? During menopause, you may experience a number of symptoms, such as:  Moderate-to-severe hot flashes.  Night sweats.  Decrease in sex drive.  Mood  swings.  Headaches.  Tiredness.  Irritability.  Memory problems.  Insomnia.  Choosing to treat or not to treat menopausal changes is an individual decision that you make with your health care provider. What should I know about hormone replacement therapy and supplements? Hormone therapy products are effective for treating symptoms that are associated with menopause, such as hot flashes and night sweats. Hormone replacement carries certain risks, especially as you become older. If you are thinking about using estrogen or estrogen with progestin treatments, discuss the benefits and risks with your health care provider. What should I know about heart disease and stroke? Heart disease, heart attack, and stroke become more likely as you age. This may be due, in part, to the hormonal changes that your body experiences during menopause. These can affect how your body processes dietary fats, triglycerides, and cholesterol. Heart attack and stroke are both medical emergencies. There are many things that you can do to help prevent heart disease and stroke:  Have your blood pressure checked at least every 1-2 years. High blood pressure causes heart disease and increases the risk of stroke.  If you are 11-21 years old, ask your health care provider if you should take aspirin to prevent a heart attack or a stroke.  Do not use any tobacco products, including cigarettes, chewing tobacco, or electronic cigarettes. If you need help quitting, ask your health care provider.  It is important to eat a healthy diet and maintain a healthy weight. ? Be sure to include plenty of vegetables, fruits, low-fat dairy products, and lean protein. ? Avoid eating foods that are high in solid fats, added sugars, or salt (sodium).  Get regular exercise. This is one of the most important things that you can do for your health. ? Try to exercise for at least 150 minutes each week. The type of exercise that you do should  increase your heart rate and make you sweat. This is known as moderate-intensity exercise. ? Try to do strengthening exercises at least twice each week. Do these in addition to the moderate-intensity exercise.  Know your numbers.Ask your health care provider to check your cholesterol and your blood glucose. Continue to have your blood tested as directed by your health care provider.  What should I know about cancer screening? There are several types of cancer. Take the following steps to reduce your risk and to catch any cancer development as early as possible. Breast Cancer  Practice breast self-awareness. ? This means understanding how your breasts normally appear and feel. ? It also means doing regular breast self-exams. Let your health care provider know about any changes, no matter how small.  If you are 62 or older, have a clinician do a breast exam (clinical breast exam or CBE) every year. Depending on your age, family history, and medical history, it may be recommended that you also  have a yearly breast X-ray (mammogram).  If you have a family history of breast cancer, talk with your health care provider about genetic screening.  If you are at high risk for breast cancer, talk with your health care provider about having an MRI and a mammogram every year.  Breast cancer (BRCA) gene test is recommended for women who have family members with BRCA-related cancers. Results of the assessment will determine the need for genetic counseling and BRCA1 and for BRCA2 testing. BRCA-related cancers include these types: ? Breast. This occurs in males or females. ? Ovarian. ? Tubal. This may also be called fallopian tube cancer. ? Cancer of the abdominal or pelvic lining (peritoneal cancer). ? Prostate. ? Pancreatic.  Cervical, Uterine, and Ovarian Cancer Your health care provider may recommend that you be screened regularly for cancer of the pelvic organs. These include your ovaries, uterus,  and vagina. This screening involves a pelvic exam, which includes checking for microscopic changes to the surface of your cervix (Pap test).  For women ages 21-65, health care providers may recommend a pelvic exam and a Pap test every three years. For women ages 37-65, they may recommend the Pap test and pelvic exam, combined with testing for human papilloma virus (HPV), every five years. Some types of HPV increase your risk of cervical cancer. Testing for HPV may also be done on women of any age who have unclear Pap test results.  Other health care providers may not recommend any screening for nonpregnant women who are considered low risk for pelvic cancer and have no symptoms. Ask your health care provider if a screening pelvic exam is right for you.  If you have had past treatment for cervical cancer or a condition that could lead to cancer, you need Pap tests and screening for cancer for at least 20 years after your treatment. If Pap tests have been discontinued for you, your risk factors (such as having a new sexual partner) need to be reassessed to determine if you should start having screenings again. Some women have medical problems that increase the chance of getting cervical cancer. In these cases, your health care provider may recommend that you have screening and Pap tests more often.  If you have a family history of uterine cancer or ovarian cancer, talk with your health care provider about genetic screening.  If you have vaginal bleeding after reaching menopause, tell your health care provider.  There are currently no reliable tests available to screen for ovarian cancer.  Lung Cancer Lung cancer screening is recommended for adults 51-81 years old who are at high risk for lung cancer because of a history of smoking. A yearly low-dose CT scan of the lungs is recommended if you:  Currently smoke.  Have a history of at least 30 pack-years of smoking and you currently smoke or have quit  within the past 15 years. A pack-year is smoking an average of one pack of cigarettes per day for one year.  Yearly screening should:  Continue until it has been 15 years since you quit.  Stop if you develop a health problem that would prevent you from having lung cancer treatment.  Colorectal Cancer  This type of cancer can be detected and can often be prevented.  Routine colorectal cancer screening usually begins at age 69 and continues through age 61.  If you have risk factors for colon cancer, your health care provider may recommend that you be screened at an earlier age.  If  you have a family history of colorectal cancer, talk with your health care provider about genetic screening.  Your health care provider may also recommend using home test kits to check for hidden blood in your stool.  A small camera at the end of a tube can be used to examine your colon directly (sigmoidoscopy or colonoscopy). This is done to check for the earliest forms of colorectal cancer.  Direct examination of the colon should be repeated every 5-10 years until age 37. However, if early forms of precancerous polyps or small growths are found or if you have a family history or genetic risk for colorectal cancer, you may need to be screened more often.  Skin Cancer  Check your skin from head to toe regularly.  Monitor any moles. Be sure to tell your health care provider: ? About any new moles or changes in moles, especially if there is a change in a mole's shape or color. ? If you have a mole that is larger than the size of a pencil eraser.  If any of your family members has a history of skin cancer, especially at a young age, talk with your health care provider about genetic screening.  Always use sunscreen. Apply sunscreen liberally and repeatedly throughout the day.  Whenever you are outside, protect yourself by wearing long sleeves, pants, a wide-brimmed hat, and sunglasses.  What should I know  about osteoporosis? Osteoporosis is a condition in which bone destruction happens more quickly than new bone creation. After menopause, you may be at an increased risk for osteoporosis. To help prevent osteoporosis or the bone fractures that can happen because of osteoporosis, the following is recommended:  If you are 47-22 years old, get at least 1,000 mg of calcium and at least 600 mg of vitamin D per day.  If you are older than age 40 but younger than age 23, get at least 1,200 mg of calcium and at least 600 mg of vitamin D per day.  If you are older than age 61, get at least 1,200 mg of calcium and at least 800 mg of vitamin D per day.  Smoking and excessive alcohol intake increase the risk of osteoporosis. Eat foods that are rich in calcium and vitamin D, and do weight-bearing exercises several times each week as directed by your health care provider. What should I know about how menopause affects my mental health? Depression may occur at any age, but it is more common as you become older. Common symptoms of depression include:  Low or sad mood.  Changes in sleep patterns.  Changes in appetite or eating patterns.  Feeling an overall lack of motivation or enjoyment of activities that you previously enjoyed.  Frequent crying spells.  Talk with your health care provider if you think that you are experiencing depression. What should I know about immunizations? It is important that you get and maintain your immunizations. These include:  Tetanus, diphtheria, and pertussis (Tdap) booster vaccine.  Influenza every year before the flu season begins.  Pneumonia vaccine.  Shingles vaccine.  Your health care provider may also recommend other immunizations. This information is not intended to replace advice given to you by your health care provider. Make sure you discuss any questions you have with your health care provider. Document Released: 04/28/2005 Document Revised: 09/24/2015  Document Reviewed: 12/08/2014 Elsevier Interactive Patient Education  2018 [employer].

## 2017-06-01 NOTE — Progress Notes (Signed)
Kara Webster 1955-12-09 [id number]   History:    62 y.o. G2P2L2  Daughters in their 31's.  2 grand-children 1 and 53 yo.  RP:  Established patient presenting for annual gyn exam   HPI: Menopausal with severe hot flushes, muscle aches and anxiety since she ran out of her HRT a month ago.  Was on Estradiol 0.5 mg PO daily and Prometrium 100 mg HS.  No PMB.  No pelvic pain.  Abstinent.  C/O Urgency.  Consumes caffeine products.  No SUI.  BMs normal, eating more prunes.  Breasts wnl.  BMI 29.05.  Not currently doing much fitness.  Past medical history,surgical history, family history and social history were all reviewed and documented in the EPIC chart.  Gynecologic History No LMP recorded. Patient is postmenopausal. Contraception: abstinence and post menopausal status Last Pap: 2017. Results were: normal per patient, will obtain [place] [facility] records Last mammogram: About 2016. Results were: normal Bone Density: No recent testing Colonoscopy: 7 yrs ago  Obstetric History OB History  Gravida Para Term Preterm AB Living  2 2       2   SAB TAB Ectopic Multiple Live Births               # Outcome Date GA Lbr Len/2nd Weight Sex Delivery Anes PTL Lv  2 Para           1 Para                ROS: A ROS was performed and pertinent positives and negatives are included in the history.  GENERAL: No fevers or chills. HEENT: No change in vision, no earache, sore throat or sinus congestion. NECK: No pain or stiffness. CARDIOVASCULAR: No chest pain or pressure. No palpitations. PULMONARY: No shortness of breath, cough or wheeze. GASTROINTESTINAL: No abdominal pain, nausea, vomiting or diarrhea, melena or bright red blood per rectum. GENITOURINARY: No urinary frequency, urgency, hesitancy or dysuria. MUSCULOSKELETAL: No joint or muscle pain, no back pain, no recent trauma. DERMATOLOGIC: No rash, no itching, no lesions. ENDOCRINE: No polyuria, polydipsia, no heat or cold intolerance. No recent  change in weight. HEMATOLOGICAL: No anemia or easy bruising or bleeding. NEUROLOGIC: No headache, seizures, numbness, tingling or weakness. PSYCHIATRIC: No depression, no loss of interest in normal activity or change in sleep pattern.     Exam:   BP 124/72   Ht 5\' 3"  (1.6 m)   Wt 164 lb (74.4 kg)   BMI 29.05 kg/m   Body mass index is 29.05 kg/m.  General appearance : Well developed well nourished female. No acute distress HEENT: Eyes: no retinal hemorrhage or exudates,  Neck supple, trachea midline, no carotid bruits, no thyroidmegaly Lungs: Clear to auscultation, no rhonchi or wheezes, or rib retractions  Heart: Regular rate and rhythm, no murmurs or gallops Breast:Examined in sitting and supine position were symmetrical in appearance, no palpable masses or tenderness,  no skin retraction, no nipple inversion, no nipple discharge, no skin discoloration, no axillary or supraclavicular lymphadenopathy Abdomen: no palpable masses or tenderness, no rebound or guarding Extremities: no edema or skin discoloration or tenderness  Pelvic: Vulva: Normal             Vagina: No gross lesions or discharge  Cervix: No gross lesions or discharge.  Pap reflex done  Uterus  AV, normal size, shape and consistency, non-tender and mobile  Adnexa  Without masses or tenderness  Anus: Normal   Assessment/Plan:  62 y.o. female for  annual exam   1. Encounter for routine gynecological examination with Papanicolaou smear of cervix Normal gynecologic exam.  Pap reflex done.  Breast exam normal.  Will schedule screening mammogram at the breast center.  Health labs with family physician.  Colonoscopy 7 years ago.  2. Post-menopause on HRT (hormone replacement therapy) Ran out of medication for hormone replacement therapy a month ago.  Very symptomatic with vasomotor symptoms, mainly hot flashes.  Also complaining of increased muscle aches and anxiety since ran out of hormones.  Wants to start back on it.   Will obtain medical records from [place], but per patient has been on it for about 10 years.  Patient informed of the increased risk of breast cancer after 10 years on hormone replacement therapy.  Also discussed the risk of blood clots and strokes increasing with age on hormone replacement therapy.  Decision by patient to continue on hormone replacement therapy with estradiol 0.5 mg tablet daily and Prometrium 100 mg tablet at bedtime.  In the course of the year, will try to wean the estradiol progressively.  3. Screening for osteoporosis Vitamin D supplements, calcium rich nutrition and regular weightbearing physical activity recommended.  Schedule bone density here. - DG Bone Density; Future  4. Urinary urgency Will try decreasing the caffeine consumption and increasing water intake.  Will also avoid overfilling of the bladder by going to the bathroom more regularly.  If worsening urinary urgency, patient will call back for referral to urology.  Other orders - estradiol (ESTRACE) 0.5 MG tablet; Take 1 tablet (0.5 mg total) by mouth daily. - progesterone (PROMETRIUM) 100 MG capsule; Take 1 capsule (100 mg total) by mouth at bedtime.  Counseling on above issues and coordination of care for more than 50% of 10 minutes.  Princess Bruins MD, 2:19 PM 06/01/2017

## 2017-06-06 LAB — PAP IG W/ RFLX HPV ASCU

## 2017-06-08 ENCOUNTER — Other Ambulatory Visit: Payer: Self-pay | Admitting: Obstetrics & Gynecology

## 2017-06-08 DIAGNOSIS — E2839 Other primary ovarian failure: Secondary | ICD-10-CM

## 2017-06-08 DIAGNOSIS — Z1231 Encounter for screening mammogram for malignant neoplasm of breast: Secondary | ICD-10-CM

## 2017-07-06 ENCOUNTER — Other Ambulatory Visit: Payer: PRIVATE HEALTH INSURANCE

## 2017-07-06 ENCOUNTER — Inpatient Hospital Stay: Admission: RE | Admit: 2017-07-06 | Payer: PRIVATE HEALTH INSURANCE | Source: Ambulatory Visit

## 2017-07-06 ENCOUNTER — Ambulatory Visit
Admission: RE | Admit: 2017-07-06 | Discharge: 2017-07-06 | Disposition: A | Discharge status: Home / Self Care | Payer: PRIVATE HEALTH INSURANCE | Source: Ambulatory Visit | Attending: Obstetrics & Gynecology | Admitting: Obstetrics & Gynecology

## 2017-07-06 DIAGNOSIS — Z1231 Encounter for screening mammogram for malignant neoplasm of breast: Secondary | ICD-10-CM

## 2018-06-18 ENCOUNTER — Other Ambulatory Visit: Payer: Self-pay

## 2018-06-21 ENCOUNTER — Encounter: Payer: PRIVATE HEALTH INSURANCE | Admitting: Obstetrics & Gynecology

## 2018-06-25 ENCOUNTER — Encounter: Payer: Self-pay | Admitting: Obstetrics & Gynecology

## 2018-06-25 ENCOUNTER — Ambulatory Visit (INDEPENDENT_AMBULATORY_CARE_PROVIDER_SITE_OTHER): Payer: PRIVATE HEALTH INSURANCE | Admitting: Obstetrics & Gynecology

## 2018-06-25 ENCOUNTER — Other Ambulatory Visit: Payer: Self-pay

## 2018-06-25 VITALS — BP 120/78 | Ht 63.5 in | Wt 140.0 lb

## 2018-06-25 DIAGNOSIS — Z01419 Encounter for gynecological examination (general) (routine) without abnormal findings: Secondary | ICD-10-CM

## 2018-06-25 DIAGNOSIS — Z79890 Hormone replacement therapy (postmenopausal): Secondary | ICD-10-CM | POA: Diagnosis not present

## 2018-06-25 DIAGNOSIS — N951 Menopausal and female climacteric states: Secondary | ICD-10-CM | POA: Diagnosis not present

## 2018-06-25 DIAGNOSIS — Z13820 Encounter for screening for osteoporosis: Secondary | ICD-10-CM | POA: Diagnosis not present

## 2018-06-25 MED ORDER — ESTRADIOL 0.5 MG PO TABS: 0.5000 mg | ORAL_TABLET | Freq: Every day | ORAL | 4 refills | Status: DC

## 2018-06-25 MED ORDER — PROGESTERONE MICRONIZED 100 MG PO CAPS: 100.0000 mg | ORAL_CAPSULE | Freq: Every day | ORAL | 4 refills | Status: DC

## 2018-06-25 NOTE — Progress Notes (Signed)
ZAHLIA DESHAZER 07-13-1955 [id number]   History:    63 y.o. G2P2L2 Single  RP:  Established patient presenting for annual gyn exam   HPI: Menopause, well on HRT with Estradiol 0.5 mg tab daily and Prometrium 100 mg HS.  No PMB.  No pelvic pain.  Abstinent.  Breasts normal.  BMI normal at 24.41.  Walks her dogs.  Healthy nutrition.  Fasting health labs here today.  Past medical history,surgical history, family history and social history were all reviewed and documented in the EPIC chart.  Gynecologic History No LMP recorded. Patient is postmenopausal. Contraception: abstinence and post menopausal status Last Pap: 05/2017. Results were: Negative Last mammogram: 06/2017. Results were: Negative Bone Density: Never Colonoscopy: 8 yrs ago  Obstetric History OB History  Gravida Para Term Preterm AB Living  _0 SAB TAB Ectopic Multiple Live Births               # Outcome Date GA Lbr Len/2nd Weight Sex Delivery Anes PTL Lv  2 Para           1 Para              ROS: A ROS was performed and pertinent positives and negatives are included in the history.  GENERAL: No fevers or chills. HEENT: No change in vision, no earache, sore throat or sinus congestion. NECK: No pain or stiffness. CARDIOVASCULAR: No chest pain or pressure. No palpitations. PULMONARY: No shortness of breath, cough or wheeze. GASTROINTESTINAL: No abdominal pain, nausea, vomiting or diarrhea, melena or bright red blood per rectum. GENITOURINARY: No urinary frequency, urgency, hesitancy or dysuria. MUSCULOSKELETAL: No joint or muscle pain, no back pain, no recent trauma. DERMATOLOGIC: No rash, no itching, no lesions. ENDOCRINE: No polyuria, polydipsia, no heat or cold intolerance. No recent change in weight. HEMATOLOGICAL: No anemia or easy bruising or bleeding. NEUROLOGIC: No headache, seizures, numbness, tingling or weakness. PSYCHIATRIC: No depression, no loss of interest in normal activity or change in sleep pattern.      Exam:   BP 120/78   Ht 5' 3.5" (1.613 m)   Wt 140 lb (63.5 kg)   BMI 24.41 kg/m   Body mass index is 24.41 kg/m.  General appearance : Well developed well nourished female. No acute distress HEENT: Eyes: no retinal hemorrhage or exudates,  Neck supple, trachea midline, no carotid bruits, no thyroidmegaly Lungs: Clear to auscultation, no rhonchi or wheezes, or rib retractions  Heart: Regular rate and rhythm, no murmurs or gallops Breast:Examined in sitting and supine position were symmetrical in appearance, no palpable masses or tenderness,  no skin retraction, no nipple inversion, no nipple discharge, no skin discoloration, no axillary or supraclavicular lymphadenopathy Abdomen: no palpable masses or tenderness, no rebound or guarding Extremities: no edema or skin discoloration or tenderness  Pelvic: Vulva: Normal             Vagina: No gross lesions or discharge  Cervix: No gross lesions or discharge  Uterus  AV, normal size, shape and consistency, non-tender and mobile  Adnexa  Without masses or tenderness  Anus: Normal   Assessment/Plan:  63 y.o. female for annual exam   1. Well female exam with routine gynecological exam Normal gynecologic exam.  Pap test March 2019 negative, no need to repeat this year.  Breast exam normal.  Will schedule screening mammogram now.  Fasting health labs here today.  Good body mass index at 24.41.  Continue with fitness and healthy nutrition. - CBC - Comp Met (CMET) - Lipid panel - TSH - VITAMIN D 25 Hydroxy (Vit-D Deficiency, Fractures)  2. Menopausal syndrome on hormone replacement therapy Well on hormone replacement therapy.  No postmenopausal bleeding.  No contraindication to continue with hormone replacement therapy.  Represcribed.  3. Screening for osteoporosis Screening for osteoporosis, will schedule bone density here now.  Vitamin D supplements, calcium intake of 1200 to 1500 mg daily total, regular weightbearing physical  activities recommended. - DG Bone Density; Future  Other orders - progesterone (PROMETRIUM) 100 MG capsule; Take 1 capsule (100 mg total) by mouth at bedtime. - estradiol (ESTRACE) 0.5 MG tablet; Take 1 tablet (0.5 mg total) by mouth daily.  Princess Bruins MD, 9:02 AM 06/25/2018

## 2018-06-25 NOTE — Patient Instructions (Signed)
1. Well female exam with routine gynecological exam Normal gynecologic exam.  Pap test March 2019 negative, no need to repeat this year.  Breast exam normal.  Will schedule screening mammogram now.  Fasting health labs here today.  Good body mass index at 24.41.  Continue with fitness and healthy nutrition. - CBC - Comp Met (CMET) - Lipid panel - TSH - VITAMIN D 25 Hydroxy (Vit-D Deficiency, Fractures)  2. Menopausal syndrome on hormone replacement therapy Well on hormone replacement therapy.  No postmenopausal bleeding.  No contraindication to continue with hormone replacement therapy.  Represcribed.  3. Screening for osteoporosis Screening for osteoporosis, will schedule bone density here now.  Vitamin D supplements, calcium intake of 1200 to 1500 mg daily total, regular weightbearing physical activities recommended. - DG Bone Density; Future  Other orders - progesterone (PROMETRIUM) 100 MG capsule; Take 1 capsule (100 mg total) by mouth at bedtime. - estradiol (ESTRACE) 0.5 MG tablet; Take 1 tablet (0.5 mg total) by mouth daily.  Kara Webster, it was a pleasure seeing you today!  I will inform you of your results as soon as they are available.

## 2018-06-26 LAB — COMPREHENSIVE METABOLIC PANEL
AG Ratio: 1.7 (calc) (ref 1.0–2.5)
ALT: 7 U/L (ref 6–29)
AST: 18 U/L (ref 10–35)
Albumin: 4.4 g/dL (ref 3.6–5.1)
Alkaline phosphatase (APISO): 59 U/L (ref 37–153)
BUN: 13 mg/dL (ref 7–25)
CO2: 31 mmol/L (ref 20–32)
Calcium: 9.6 mg/dL (ref 8.6–10.4)
Chloride: 101 mmol/L (ref 98–110)
Creat: 0.95 mg/dL (ref 0.50–0.99)
Globulin: 2.6 g/dL (calc) (ref 1.9–3.7)
Glucose, Bld: 85 mg/dL (ref 65–99)
Potassium: 4.1 mmol/L (ref 3.5–5.3)
Sodium: 141 mmol/L (ref 135–146)
Total Bilirubin: 0.6 mg/dL (ref 0.2–1.2)
Total Protein: 7 g/dL (ref 6.1–8.1)

## 2018-06-26 LAB — LIPID PANEL
Cholesterol: 220 mg/dL — ABNORMAL HIGH (ref <200)
HDL: 75 mg/dL (ref >=50)
LDL Cholesterol (Calc): 129 mg/dL (calc) — ABNORMAL HIGH
Non-HDL Cholesterol (Calc): 145 mg/dL (calc) — ABNORMAL HIGH (ref <130)
Total CHOL/HDL Ratio: 2.9 (calc) (ref <5.0)
Triglycerides: 69 mg/dL (ref <150)

## 2018-06-26 LAB — CBC
HCT: 41.6 % (ref 35.0–45.0)
Hemoglobin: 14 g/dL (ref 11.7–15.5)
MCH: 29.9 pg (ref 27.0–33.0)
MCHC: 33.7 g/dL (ref 32.0–36.0)
MCV: 88.7 fL (ref 80.0–100.0)
MPV: 9 fL (ref 7.5–12.5)
Platelets: 358 10*3/uL (ref 140–400)
RBC: 4.69 10*6/uL (ref 3.80–5.10)
RDW: 12.3 % (ref 11.0–15.0)
WBC: 5.8 10*3/uL (ref 3.8–10.8)

## 2018-06-26 LAB — TSH: TSH: 2.19 mIU/L (ref 0.40–4.50)

## 2018-06-26 LAB — VITAMIN D 25 HYDROXY (VIT D DEFICIENCY, FRACTURES): Vit D, 25-Hydroxy: 44 ng/mL (ref 30–100)

## 2018-07-15 ENCOUNTER — Telehealth (HOSPITAL_COMMUNITY): Payer: Self-pay | Admitting: Psychology

## 2018-10-27 ENCOUNTER — Emergency Department (HOSPITAL_COMMUNITY)
Admission: EM | Admit: 2018-10-27 | Discharge: 2018-10-27 | Disposition: A | Discharge status: Home / Self Care | Payer: PRIVATE HEALTH INSURANCE | Attending: Emergency Medicine | Admitting: Emergency Medicine

## 2018-10-27 ENCOUNTER — Other Ambulatory Visit: Payer: Self-pay

## 2018-10-27 DIAGNOSIS — Z79899 Other long term (current) drug therapy: Secondary | ICD-10-CM | POA: Diagnosis not present

## 2018-10-27 DIAGNOSIS — Z87891 Personal history of nicotine dependence: Secondary | ICD-10-CM | POA: Insufficient documentation

## 2018-10-27 DIAGNOSIS — Z9119 Patient's noncompliance with other medical treatment and regimen: Secondary | ICD-10-CM | POA: Diagnosis not present

## 2018-10-27 DIAGNOSIS — R519 Headache, unspecified: Secondary | ICD-10-CM

## 2018-10-27 DIAGNOSIS — R51 Headache: Secondary | ICD-10-CM | POA: Diagnosis present

## 2018-10-27 LAB — COMPREHENSIVE METABOLIC PANEL
ALT: 12 U/L (ref 0–44)
AST: 20 U/L (ref 15–41)
Albumin: 4.2 g/dL (ref 3.5–5.0)
Alkaline Phosphatase: 55 U/L (ref 38–126)
Anion gap: 11 (ref 5–15)
BUN: 13 mg/dL (ref 6–20)
CO2: 25 mmol/L (ref 22–32)
Calcium: 9.3 mg/dL (ref 8.9–10.3)
Chloride: 104 mmol/L (ref 98–111)
Creatinine, Ser: 0.73 mg/dL (ref 0.44–1.00)
GFR calc Af Amer: >60 mL/min (ref >60)
GFR calc non Af Amer: >60 mL/min (ref >60)
Glucose, Bld: 109 mg/dL — ABNORMAL HIGH (ref 70–99)
Potassium: 3.7 mmol/L (ref 3.5–5.1)
Sodium: 140 mmol/L (ref 135–145)
Total Bilirubin: 0.7 mg/dL (ref 0.3–1.2)
Total Protein: 6.8 g/dL (ref 6.5–8.1)

## 2018-10-27 LAB — CBC WITH DIFFERENTIAL/PLATELET
Abs Immature Granulocytes: 0.05 10*3/uL (ref 0.00–0.07)
Basophils Absolute: 0 10*3/uL (ref 0.0–0.1)
Basophils Relative: 0 %
Eosinophils Absolute: 0.1 10*3/uL (ref 0.0–0.5)
Eosinophils Relative: 1 %
HCT: 41.7 % (ref 36.0–46.0)
Hemoglobin: 13.9 g/dL (ref 12.0–15.0)
Immature Granulocytes: 1 %
Lymphocytes Relative: 14 %
Lymphs Abs: 1.5 10*3/uL (ref 0.7–4.0)
MCH: 30 pg (ref 26.0–34.0)
MCHC: 33.3 g/dL (ref 30.0–36.0)
MCV: 89.9 fL (ref 80.0–100.0)
Monocytes Absolute: 0.5 10*3/uL (ref 0.1–1.0)
Monocytes Relative: 4 %
Neutro Abs: 8.8 10*3/uL — ABNORMAL HIGH (ref 1.7–7.7)
Neutrophils Relative %: 80 %
Platelets: 349 10*3/uL (ref 150–400)
RBC: 4.64 MIL/uL (ref 3.87–5.11)
RDW: 12.6 % (ref 11.5–15.5)
WBC: 10.9 10*3/uL — ABNORMAL HIGH (ref 4.0–10.5)
nRBC: 0 % (ref 0.0–0.2)

## 2018-10-27 LAB — ETHANOL: Alcohol, Ethyl (B): <10 mg/dL (ref <10)

## 2018-10-27 LAB — I-STAT BETA HCG BLOOD, ED (MC, WL, AP ONLY): I-stat hCG, quantitative: 8.7 m[IU]/mL — ABNORMAL HIGH (ref <5)

## 2018-10-27 MED ORDER — PROCHLORPERAZINE EDISYLATE 10 MG/2ML IJ SOLN
10.0000 mg | Freq: Once | INTRAMUSCULAR | Status: AC
  Administered 2018-10-27: 10 mg via INTRAVENOUS
  Filled 2018-10-27: qty 2

## 2018-10-27 MED ORDER — DEXAMETHASONE SODIUM PHOSPHATE 10 MG/ML IJ SOLN
10.0000 mg | Freq: Once | INTRAMUSCULAR | Status: AC
  Administered 2018-10-27: 10 mg via INTRAVENOUS
  Filled 2018-10-27: qty 1

## 2018-10-27 MED ORDER — DULOXETINE HCL 30 MG PO CPEP
60.0000 mg | ORAL_CAPSULE | Freq: Once | ORAL | Status: AC
  Administered 2018-10-27: 60 mg via ORAL
  Filled 2018-10-27: qty 2

## 2018-10-27 MED ORDER — DIPHENHYDRAMINE HCL 50 MG/ML IJ SOLN
25.0000 mg | Freq: Once | INTRAMUSCULAR | Status: AC
  Administered 2018-10-27: 25 mg via INTRAVENOUS
  Filled 2018-10-27: qty 1

## 2018-10-27 MED ORDER — DULOXETINE HCL 60 MG PO CPEP: 60.0000 mg | ORAL_CAPSULE | Freq: Every day | ORAL | 0 refills | Status: DC

## 2018-10-27 MED ORDER — ONDANSETRON 4 MG PO TBDP
4.0000 mg | ORAL_TABLET | Freq: Once | ORAL | Status: AC
  Administered 2018-10-27: 4 mg via ORAL
  Filled 2018-10-27: qty 1

## 2018-10-27 NOTE — ED Triage Notes (Signed)
Per EMS - Pt found in car in [employer] parking lot with CC of headache n/v. Pt hx of bipolar, Manic Depressive and states that she has been off of her meds for an unknown period of time. Family showed up on scene and stated to EMS "pt is making herself throw up". Was seen by EMS earlier in the day and refused all services due to BP cuff being too tight. Stroke screen neg.   CBG 137  60 HR 22 R 98%  118 Palp BP

## 2018-10-27 NOTE — ED Provider Notes (Signed)
[facility] DEPT Provider Note  CSN: [id number] Arrival date & time: 10/27/18 0151  Chief Complaint(s) No chief complaint on file.  HPI Kara Webster is a 63 y.o. female here for generalized throbbing headache for 1 day. Similar to prior headaches that occur when she is off of her Cymbalta. Reports that she ran out of her medication 3-4 days ago. Endorses photophobia. No alleviating factors. Some nausea and NBNB emesis. No fever, chills, visual disturbance, weakness.  HPI  Past Medical History Past Medical History:  Diagnosis Date  . Depression    Patient Active Problem List   Diagnosis Date Noted  . Symptoms, such as flushing, sleeplessness, headache, lack of concentration, associated with the menopause 07/19/2015  . Incomplete defecation 10/07/2013  . Episodic mood disorder ([place]) 05/28/2013  . History of substance abuse ([place]) 05/28/2013   Home Medication(s) Prior to Admission medications   Medication Sig Start Date End Date Taking? Authorizing Provider  estradiol (ESTRACE) 0.5 MG tablet Take 1 tablet (0.5 mg total) by mouth daily. 06/25/18  Yes Princess Bruins, MD  gabapentin (NEURONTIN) 300 MG capsule Take 300 mg by mouth 3 (three) times daily.   Yes [provider]  lamoTRIgine (LAMICTAL) 100 MG tablet Take 1 tablet (100 mg total) by mouth daily. Patient taking differently: Take 200 mg by mouth daily.  05/27/13  Yes Leandrew Koyanagi, MD  progesterone (PROMETRIUM) 100 MG capsule Take 1 capsule (100 mg total) by mouth at bedtime. 06/25/18  Yes Princess Bruins, MD  DULoxetine (CYMBALTA) 60 MG capsule Take 1 capsule (60 mg total) by mouth daily. 10/27/18   Fatima Blank, MD                                                                                                                                    Past Surgical History Past Surgical History:  Procedure Laterality Date  . BLADDER SURGERY    . TUBAL LIGATION     Family  History Family History  Problem Relation Age of Onset  . Cancer Mother        leukemia   . Hypertension Mother   . Cancer Father        lung   . Diabetes Sister   . Hyperlipidemia Sister   . Hypertension Sister   . Depression Brother   . Diabetes Brother   . Hypertension Brother   . Alcohol abuse Maternal Uncle   . Alcohol abuse Paternal Aunt   . Alcohol abuse Maternal Grandfather     Social History Social History   Tobacco Use  . Smoking status: Former Smoker    Quit date: 06/01/1996    Years since quitting: 22.4  . Smokeless tobacco: Never Used  Substance Use Topics  . Alcohol use: No  . Drug use: No   Allergies Codeine  Review of Systems Review of Systems All other systems are reviewed and are negative for  acute change except as noted in the HPI  Physical Exam Vital Signs  I have reviewed the triage vital signs BP (!) 156/70 (BP Location: Right Arm)   Pulse (!) 59   Temp (!) 97.4 F (36.3 C) (Oral)   Resp 20   Ht 5\' 4"  (1.626 m)   Wt 63.5 kg   SpO2 100%   BMI 24.03 kg/m   Physical Exam Vitals signs reviewed.  Constitutional:      General: She is not in acute distress.    Appearance: She is well-developed. She is not diaphoretic.  HENT:     Head: Normocephalic and atraumatic.     Right Ear: External ear normal.     Left Ear: External ear normal.     Nose: Nose normal.  Eyes:     General: No scleral icterus.    Conjunctiva/sclera: Conjunctivae normal.  Neck:     Musculoskeletal: Normal range of motion.     Trachea: Phonation normal.  Cardiovascular:     Rate and Rhythm: Normal rate and regular rhythm.  Pulmonary:     Effort: Pulmonary effort is normal. No respiratory distress.     Breath sounds: No stridor.  Abdominal:     General: There is no distension.  Musculoskeletal: Normal range of motion.  Neurological:     Mental Status: She is alert and oriented to person, place, and time.     Comments: Moves all extremities  Psychiatric:         Behavior: Behavior normal.     ED Results and Treatments Labs (all labs ordered are listed, but only abnormal results are displayed) Labs Reviewed  COMPREHENSIVE METABOLIC PANEL - Abnormal; Notable for the following components:      Result Value   Glucose, Bld 109 (*)    All other components within normal limits  CBC WITH DIFFERENTIAL/PLATELET - Abnormal; Notable for the following components:   WBC 10.9 (*)    Neutro Abs 8.8 (*)    All other components within normal limits  I-STAT BETA HCG BLOOD, ED ([facility], [facility], [facility] ONLY) - Abnormal; Notable for the following components:   I-stat hCG, quantitative 8.7 (*)    All other components within normal limits  ETHANOL  RAPID URINE DRUG SCREEN, HOSP PERFORMED                                                                                                                         EKG  EKG Interpretation  Date/Time:    Ventricular Rate:    PR Interval:    QRS Duration:   QT Interval:    QTC Calculation:   R Axis:     Text Interpretation:        Radiology No results found.  Pertinent labs & imaging results that were available during my care of the patient were reviewed by me and considered in my medical decision making (see chart for details).  Medications Ordered in ED Medications  DULoxetine (CYMBALTA) DR  capsule 60 mg (60 mg Oral Given 10/27/18 0310)  ondansetron (ZOFRAN-ODT) disintegrating tablet 4 mg (4 mg Oral Given 10/27/18 0310)  diphenhydrAMINE (BENADRYL) injection 25 mg (25 mg Intravenous Given 10/27/18 0344)  dexamethasone (DECADRON) injection 10 mg (10 mg Intravenous Given 10/27/18 0345)  prochlorperazine (COMPAZINE) injection 10 mg (10 mg Intravenous Given 10/27/18 0345)                                                                                                                                    Procedures Procedures  (including critical care time)  Medical Decision Making / ED Course I have reviewed the nursing notes for  this encounter and the patient's prior records (if available in EHR or on provided paperwork).   Kara Webster was evaluated in Emergency Department on 10/27/2018 for the symptoms described in the history of present illness. She was evaluated in the context of the global COVID-19 pandemic, which necessitated consideration that the patient might be at risk for infection with the SARS-CoV-2 virus that causes COVID-19. Institutional protocols and algorithms that pertain to the evaluation of patients at risk for COVID-19 are in a state of rapid change based on information released by regulatory bodies including the CDC and federal and state organizations. These policies and algorithms were followed during the patient's care in the ED.  Typical headache for the pt. Non focal neuro exam. No recent head trauma. No fever. Doubt meningitis. Doubt intracranial bleed. Doubt IIH. No indication for imaging.   Will treat with migraine cocktail and reevaluate.  Complete resolution.  Well appearing. No pressured speech, delusions, SI, HI, or AVH. Discussed with daughter. Rx for 1 week of her Cymbalta until she can sort out her Rx.  The patient appears reasonably screened and/or stabilized for discharge and I doubt any other medical condition or other [facility] requiring further screening, evaluation, or treatment in the ED at this time prior to discharge.  The patient is safe for discharge with strict return precautions.       Final Clinical Impression(s) / ED Diagnoses Final diagnoses:  Bad headache     The patient appears reasonably screened and/or stabilized for discharge and I doubt any other medical condition or other [facility] requiring further screening, evaluation, or treatment in the ED at this time prior to discharge.  Disposition: Discharge  Condition: Good  I have discussed the results, Dx and Tx plan with the patient who expressed understanding and agree(s) with the plan. Discharge instructions  discussed at great length. The patient was given strict return precautions who verbalized understanding of the instructions. No further questions at time of discharge.    ED Discharge Orders         Ordered    DULoxetine (CYMBALTA) 60 MG capsule  Daily     10/27/18 0457           Follow Up: Primary care provider  Schedule an appointment  as soon as possible for a visit  to get Rx for Cymbalta if needed    This chart was dictated using voice recognition software.  Despite best efforts to proofread,  errors can occur which can change the documentation meaning.   Fatima Blank, MD 10/27/18 [phone]

## 2018-11-01 ENCOUNTER — Other Ambulatory Visit: Payer: Self-pay

## 2018-11-01 DIAGNOSIS — Z20822 Contact with and (suspected) exposure to covid-19: Secondary | ICD-10-CM

## 2018-11-02 LAB — NOVEL CORONAVIRUS, NAA: SARS-CoV-2, NAA: NOT DETECTED

## 2018-11-29 ENCOUNTER — Encounter: Payer: Self-pay | Admitting: Psychiatry

## 2018-11-29 ENCOUNTER — Other Ambulatory Visit: Payer: Self-pay

## 2018-11-29 ENCOUNTER — Ambulatory Visit (INDEPENDENT_AMBULATORY_CARE_PROVIDER_SITE_OTHER): Payer: PRIVATE HEALTH INSURANCE | Admitting: Psychiatry

## 2018-11-29 VITALS — BP 103/65 | HR 75 | Ht 63.0 in | Wt 130.0 lb

## 2018-11-29 DIAGNOSIS — F329 Major depressive disorder, single episode, unspecified: Secondary | ICD-10-CM | POA: Diagnosis not present

## 2018-11-29 DIAGNOSIS — F32A Depression, unspecified: Secondary | ICD-10-CM

## 2018-11-29 DIAGNOSIS — F411 Generalized anxiety disorder: Secondary | ICD-10-CM

## 2018-11-29 MED ORDER — BUSPIRONE HCL 15 MG PO TABS: ORAL_TABLET | ORAL | 1 refills | Status: DC

## 2018-11-29 NOTE — Progress Notes (Signed)
[facility] MD/PA/NP Initial Note  11/30/2018 9:10 PM Kara Webster  MRN:  [id number]  Chief Complaint:  Chief Complaint    Anxiety; Other; Depression; Insomnia      HPI: Pt is a 63 yo female seen for initial evaluation for anxiety, impaired concentration, depression, and insomnia. She reports that her s/s began about a year ago and worsened in January. She reports that s/s seemed to have been triggered by psychosocial stressors.   She reports that she was working with a Pharmacist, community for 14 years and then the practice was bought and the dentist she had worked with retired. She reports that she is now working for a new dentist who does things differently and has high expectations.  She reports that this tranisition happened about 4 years ago and they moved a year ago with another practice that they merged with. Then work changed significantly with the start of covid. She reports difficulty keeping up at work and that she is coming in an hour before her first pt and staying 45 minutes late. Is also ending a 13 year relationship and they remain friends. Reports that she also moved out of their home and reports that her focus was off during this time.   She reports that she has been having muscle tension and HA's due to stress. Reports having "anxiety attacks" and "paralyzing" at times. Reports that she has also felt the need to run out but unable to do so.   Reports long-standing anxiety and "specific incidents will cause a panic attack." Reports panic attacks off and on for years. Panic attacks currently most severe that they have ever been and are happening at least weekly and sometimes daily. Reports that work stress is primary trigger to anxiety and fear of making a mistake or forgetting something. Will feel her heart race, going numb, wanting to leave the room, and feeling as if she is going to lose control. She reports that when panic occurs she will feel "numb." Reports frequent worry and rumination.  Has been having catastrophic thinking. Reports poor concentration and difficulty knowing where and how to start.   Reports long-standing social anxiety and avoids people. Would like to have friends but has difficulty making friends. Had social anxiety as a child with going to school. Severe discomfort with public speaking. Often feels that she is being judged.  Reports that she is "very routine oriented and I like my house to be neat." Has anxiety when her belongings are moved. Reports some rituals with having clothes folded a certain way, no dishes in the sink, and making bed and has anxiety if these are not done. Denies any obsessive thoughts and compulsions.   Reports long-standing h/o depression. "I used to sleep a lot. I think it was a form of escape." She reports frequent tearfulness. Increased sadness in response to thinking that she may lose her job. She reports that she has had some long-standing irritability and that this has been increased. Reports that she has been "yelling at people when I don't really have a valid reason." She reports poor sleep with difficulty falling asleep and frequent middle of the night awakenings. Sleeping 5 hours a night and typically needs 8 hours of sleep. Appetite has been decreased. Reports that she is not eating much during the week and only eats when someone has prepared food for her. Intentionally lost 20 lbs last year and has lost another 10 lbs unintentionally. She reports poor energy and motivation. Concentration impairment. Reports that  she enjoys getting her house set up. Vague passive death wishes. Denies SI.    She reports "there are times I feel great." She reports that she had "extremes" in mood in the context of ETOH and substance use. Denies elevated moods when not using substances. Denies periods of decreased need for sleep. Denies racing thoughts that are not anxious in content. "I have always been one to take risks" and describes moving frequently,  starting and ending relationships, as well as buying and selling property. Denies any outstanding debts. Reports that she considered the consequences of these decisions prior to making them.   Denies ETOH or drug use in the last 3-4 years. Reports ETOH has been drug of choice. Reports ETOH and drug issues since age 53. Reports that she has had some outpatient substance abuse therapy. Reports that she has attended AA intermittently over the past years. Denies current cravings to drink or use.   Denies AH or VH. Denies paranoia.  Born in [place], [place] and moved to [place] at a young age. Has one brother that is now deceased. Has a sister that is 70 years older. Has a brother 2 years younger. Father was a truck driver and would be gone for long periods. Mother worked. "I can't really remember a lot of my childhood... I think there was a lot of neglect." Dropped out of HS at 18. Returned for [employer]. Returned for associates degree at 63 yo. Was married for 3 years and then divorced. Reports that ex-husband is father of her 2 daughters. Daughters have contact with their father and he has re-married. Has 2 daughters and 2 granddaughters that live nearby. Enjoys spending time with family. Daughter that has 2 daughters in process of divorce. Pt moved in June to a townhouse in [place] to be close to daughter and grandchildren and reports that they are going to have to move due to landlord selling the property. Enjoys growing flowers. Used to enjoy reading.   She reports that she has learning disabilities and thinks that this contributes to her anxiety.  Past Psychiatric Medication Trials: Abilify- Caused tremors. Has not noticed any worsening s/s since stopping it.  Cymbalta- Thinks this has been helpful for her depression. Has had severe discontinuation s/s with missed doses to the point of going to ER.  Lamictal- Has been helpful for mood. Reports taking long term. Gabapentin- Takes for RLS with good  response. Wellbutrin- Had convulsion when taking while drinking heavily.   Visit Diagnosis:    ICD-10-CM   1. Generalized anxiety disorder  F41.1 busPIRone (BUSPAR) 15 MG tablet  2. Depression, unspecified depression type  F32.9     Past Psychiatric History: Outpatient therapy for substance use. Reports seeing therapists in the past.  Reports that she was seeing Dr. Owens Shark, who then died. Saw a therapist for years and reports that when therapist left it was a significant lost. Brief voluntary hospitalization to [facility].   Past Medical History:  Past Medical History:  Diagnosis Date  . Depression   . RLS (restless legs syndrome)     Past Surgical History:  Procedure Laterality Date  . BLADDER SURGERY    . TUBAL LIGATION      Family History:  Family History  Problem Relation Age of Onset  . Cancer Mother        leukemia   . Hypertension Mother   . Cancer Father        lung   . Alcohol abuse Father   .  Diabetes Sister   . Hyperlipidemia Sister   . Hypertension Sister   . Depression Brother   . Diabetes Brother   . Hypertension Brother   . Alcohol abuse Maternal Uncle   . Alcohol abuse Paternal Aunt   . Alcohol abuse Maternal Grandfather   . Alcohol abuse Brother     Social History:  Social History   Socioeconomic History  . Marital status: Divorced    Spouse name: Not on file  . Number of children: Not on file  . Years of education: Not on file  . Highest education level: Not on file  Occupational History  . Not on file  Social Needs  . Financial resource strain: Not on file  . Food insecurity    Worry: Not on file    Inability: Not on file  . Transportation needs    Medical: Not on file    Non-medical: Not on file  Tobacco Use  . Smoking status: Former Smoker    Quit date: 06/01/1996    Years since quitting: 22.5  . Smokeless tobacco: Never Used  Substance and Sexual Activity  . Alcohol use: Not Currently  . Drug use: Not Currently  . Sexual activity:  Never    Comment: 1st intercourse- 15, partners- greater than 5,   Lifestyle  . Physical activity    Days per week: Not on file    Minutes per session: Not on file  . Stress: Not on file  Relationships  . Social Herbalist on phone: Not on file    Gets together: Not on file    Attends religious service: Not on file    Active member of club or organization: Not on file    Attends meetings of clubs or organizations: Not on file    Relationship status: Not on file  Other Topics Concern  . Not on file  Social History Narrative  . Not on file    Allergies:  Allergies  Allergen Reactions  . Codeine Nausea And Vomiting    Metabolic Disorder Labs: No results found for: HGBA1C, MPG No results found for: PROLACTIN Lab Results  Component Value Date   CHOL 220 (H) 06/25/2018   TRIG 69 06/25/2018   HDL 75 06/25/2018   CHOLHDL 2.9 06/25/2018   LDLCALC 129 (H) 06/25/2018   Lab Results  Component Value Date   TSH 2.19 06/25/2018   TSH 2.184 05/27/2013    Therapeutic Level Labs: No results found for: LITHIUM No results found for: VALPROATE No components found for:  CBMZ  Current Medications: Current Outpatient Medications  Medication Sig Dispense Refill  . CALCIUM PO Take by mouth.    . DULoxetine (CYMBALTA) 60 MG capsule Take 1 capsule (60 mg total) by mouth daily. 7 capsule 0  . estradiol (ESTRACE) 0.5 MG tablet Take 1 tablet (0.5 mg total) by mouth daily. 90 tablet 4  . gabapentin (NEURONTIN) 300 MG capsule Take 300 mg by mouth 3 (three) times daily.    Marland Kitchen lamoTRIgine (LAMICTAL) 100 MG tablet Take 1 tablet (100 mg total) by mouth daily. (Patient taking differently: Take 200 mg by mouth daily. ) 14 tablet 0  . progesterone (PROMETRIUM) 100 MG capsule Take 1 capsule (100 mg total) by mouth at bedtime. 90 capsule 4  . TURMERIC PO Take by mouth.    . busPIRone (BUSPAR) 15 MG tablet Take 1/3 tablet p.o. twice daily for 1 week, then take 2/3 tablet p.o. twice daily for  1 week,  then take 1 tablet p.o. twice daily 60 tablet 1   No current facility-administered medications for this visit.     Medication Side Effects: none  Orders placed this visit:  No orders of the defined types were placed in this encounter.   Psychiatric Specialty Exam:  Review of Systems  Constitutional: Positive for malaise/fatigue.  HENT: Positive for hearing loss.   Eyes: Negative.   Respiratory: Negative.   Cardiovascular: Negative.   Gastrointestinal: Positive for constipation.  Genitourinary: Positive for urgency.  Musculoskeletal: Negative.   Skin: Negative.   Neurological: Positive for headaches.  Endo/Heme/Allergies: Negative.   Psychiatric/Behavioral:       Please refer to HPI     Blood pressure 103/65, pulse 75, height 5\' 3"  (1.6 m), weight 130 lb (59 kg).Body mass index is 23.03 kg/m.  General Appearance: Casual, Neat and Well Groomed  Eye Contact:  Good  Speech:  Clear and Coherent and Normal Rate  Volume:  Normal  Mood:  Anxious and Depressed  Affect:  Anxious  Thought Process:  Coherent and Linear  Orientation:  Full (Time, Place, and Person)  Thought Content: Logical and Hallucinations: None   Suicidal Thoughts:  No  Homicidal Thoughts:  No  Memory:  WNL  Judgement:  Good  Insight:  Good  Psychomotor Activity:  Normal  Concentration:  Concentration: Good  Recall:  Good  Fund of Knowledge: Good  Language: Good  Assets:  Desire for Improvement Resilience  ADL's:  Intact  Cognition: WNL  Prognosis:  Good   Screenings:   Receiving Psychotherapy: No   Treatment Plan/Recommendations: Pt seen for 60 minutes and greater than 50% of session spent counseling pt re: mood and anxiety s/s and possible treatment options. Discussed proposed mechanisms of actions of medications used to treat depression and anxiety. Discussed potential benefits, risks, and side effects of Buspar. Discussed that Buspar can be added to current medications to improve anxiety.  Discussed that some studies also suggest that Buspar may be effective for concentration. Discussed not decreasing Cymbalta at this time since pt reports severe discontinuation s/s with Cymbalta in the past. Recommend also continuing Lamictal for mood s/s. Discussed potential benefits of therapy and pt is amenable to therapy. Pt assisted with scheduling therapy apt with Rinaldo Cloud, LCSW. Pt to f/u with this provider in 6 weeks or sooner if clinically indicated.     [name], PMHNP

## 2018-12-06 ENCOUNTER — Telehealth: Payer: Self-pay | Admitting: Psychiatry

## 2018-12-06 NOTE — Telephone Encounter (Signed)
Patient has questions regarding Buproprion that was presscribed

## 2018-12-06 NOTE — Telephone Encounter (Signed)
Pt. 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 She noticed that over the last 8 months she has been kind of puckering her mouth. At first she thought maybe it was a habit but it has continued. Now she has been clenching her teeth. It is gradually getting worse. She heard about TD and did some research on it and thinks this may be what she is experiencing. She wanted to know side effects of the Buspar so I went over those with her. She wanted to know your thoughts on this and if you think the Buspar will make this worse. She rescheduled her appt. To October 30th so she could see you sooner. Please advise.

## 2018-12-09 NOTE — Telephone Encounter (Signed)
Pt. Did not answer. VM left to return my call.

## 2018-12-10 NOTE — Telephone Encounter (Signed)
Tried calling pt. Again today to relay info. No answer. Left her a VM to return my call.

## 2018-12-11 NOTE — Telephone Encounter (Signed)
Tried calling pt. Again today to relay info. No answer. Left her a VM to return my call tomorrow.

## 2018-12-12 NOTE — Telephone Encounter (Signed)
Pt. Returned my call today and left a message on our Voicemail. I tried to reach her but did not get an answer. I left a VM for her to return my call tomorrow and if she didn't get me if she could let someone know if it was okay to leave detailed personal message on her VM.

## 2018-12-13 ENCOUNTER — Ambulatory Visit: Payer: PRIVATE HEALTH INSURANCE | Admitting: Psychiatry

## 2018-12-13 NOTE — Telephone Encounter (Signed)
Pt. Made aware and verbalized understanding. She thinks the symptoms may be improving a little bit as her stress levels are decreasing. She will call if there are any worsening symptoms.

## 2018-12-20 ENCOUNTER — Ambulatory Visit (INDEPENDENT_AMBULATORY_CARE_PROVIDER_SITE_OTHER): Payer: PRIVATE HEALTH INSURANCE | Admitting: Psychiatry

## 2018-12-20 ENCOUNTER — Other Ambulatory Visit: Payer: Self-pay

## 2018-12-20 DIAGNOSIS — F411 Generalized anxiety disorder: Secondary | ICD-10-CM
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
## 2019-01-10 ENCOUNTER — Ambulatory Visit (INDEPENDENT_AMBULATORY_CARE_PROVIDER_SITE_OTHER): Payer: PRIVATE HEALTH INSURANCE | Admitting: Psychiatry

## 2019-01-10 ENCOUNTER — Ambulatory Visit: Payer: PRIVATE HEALTH INSURANCE | Admitting: Psychiatry

## 2019-01-10 ENCOUNTER — Other Ambulatory Visit: Payer: Self-pay

## 2019-01-10 DIAGNOSIS — F411 Generalized anxiety disorder: Secondary | ICD-10-CM | POA: Diagnosis not present

## 2019-01-10 NOTE — Progress Notes (Signed)
      [facility] Counselor/Therapist Progress Note  Patient ID: Kara Webster, MRN: [id number],    Date: 01/10/2019  Time Spent: 60 minutes   12:00noon to 1:00pm  Treatment Type: Individual Therapy  Reported Symptoms: depression, anxiety, frustration, anger, overwhelmed  Mental Status Exam:  Appearance:   Well Groomed     Behavior:  Appropriate and Sharing  Motor:  Normal  Speech/Language:   Normal Rate  Affect:  anxious, depressed  Mood:  anxious and depressed  Thought process:  goal directed  Thought content:    WNL  Sensory/Perceptual disturbances:    WNL  Orientation:  oriented to person, place, time/date, situation, day of week, month of year and year  Attention:  Good  Concentration:  Good  Memory:  WNL  Fund of knowledge:   Good  Insight:    Good  Judgment:   Good  Impulse Control:  Good   Risk Assessment: Danger to Self:  No Self-injurious Behavior: No Danger to Others: No Duty to Warn:no Physical Aggression / Violence:No  Access to Firearms a concern: No  Gang Involvement:No   Subjective:  Patient in today and states her anxiety medication is helping some.  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  Difficulty learning new computer information and feels "less than".  Sad, tearful, feeling badly about herself.  Is in stressful situation and feeling lots of pressure and not sure she can stay in job.    Interventions: Cognitive Behavioral Therapy and Solution-Oriented/Positive Psychology  Diagnosis:   ICD-10-CM   1. Generalized anxiety disorder  F41.1      Plan of Care:  Patient not signing tx plan on computer screen due to [place].  Treatment Goals: Goals remain on tx plan as patient works on strategies to meet her goals. Progress will be documented each session in "Progress" section of Plan.  Long term  goal: Reduce overall level, frequency, and intensity of the anxiety so that daily functioning is not impaired.  Short term goal: Increase understanding of beliefs and messages that produce worry and anxiety, and be able to develop more adaptive and positive beliefs and messages.  Strategies: 1. Explore cognitive messages that lead to anxiety and learn more adaptive cognitive patterns that do not support anxiety, and do support calmness, rational thinking, and self-confidence. 2.  Identify, challenge, and replace fearful, indecisive, and anxiety-provoking self-talk with positive, reality-based, and empowering self-talk.  PROGRESS: Patient is trying to be more motivated at a time when she is very stressed, overwhelmed, lowered self-esteem, and struggling.  Denies any SI.  Working today on her short term goal of better understanding how beliefs and messages affect how she feels, especially the negative/depressive/anxious messages.Practiced in session, catching negative thoughts, interrupting them, and replacing with more positive, reality-based, self-affirming thoughts. Will follow up next session and she is to continue practicing this on her own.  Goal review with patient and pointed out some of her strengths.     Next appt within 2-3 weeks.   Shanon Ace, LCSW

## 2019-01-17 ENCOUNTER — Ambulatory Visit (INDEPENDENT_AMBULATORY_CARE_PROVIDER_SITE_OTHER): Payer: PRIVATE HEALTH INSURANCE | Admitting: Psychiatry

## 2019-01-17 ENCOUNTER — Encounter: Payer: Self-pay | Admitting: Psychiatry

## 2019-01-17 ENCOUNTER — Other Ambulatory Visit: Payer: Self-pay

## 2019-01-17 DIAGNOSIS — F411 Generalized anxiety disorder: Secondary | ICD-10-CM | POA: Diagnosis not present

## 2019-01-17 DIAGNOSIS — F329 Major depressive disorder, single episode, unspecified: Secondary | ICD-10-CM

## 2019-01-17 DIAGNOSIS — F32A Depression, unspecified: Secondary | ICD-10-CM

## 2019-01-17 MED ORDER — DULOXETINE HCL 60 MG PO CPEP: 60.0000 mg | ORAL_CAPSULE | Freq: Every day | ORAL | 1 refills | Status: DC

## 2019-01-17 MED ORDER — LAMOTRIGINE 200 MG PO TABS: 200.0000 mg | ORAL_TABLET | Freq: Every day | ORAL | 1 refills | Status: DC

## 2019-01-17 MED ORDER — BUSPIRONE HCL 15 MG PO TABS: 15.0000 mg | ORAL_TABLET | Freq: Two times a day (BID) | ORAL | 1 refills | Status: DC

## 2019-01-17 NOTE — Progress Notes (Addendum)
AMISHA MERRYMAN [id number] October 19, 1955 63 y.o.  Subjective:   Patient ID:  Kara Webster is a 63 y.o. (DOB March 11, 1956) female.  Chief Complaint:  Chief Complaint  Patient presents with  . Follow-up    Anxiety and Depression    HPI CARINNE BARINA presents to the office today for follow-up of anxiety and depression. She reports improved anxiety with Buspar. She reports some continued situational anxiety with work situation. No longer experiencing the feeling that she needs to run out of the room. Denies any recent panic attacks. Continues to have worry and anxious thoughts. Continued rumination, especially about work situation and thinking about possible loss of income. She reports feeling stressed by "little things" that need to be done and describes thinking about different steps involved and then feeling overwhelmed. She reports that her energy and motivation are very low at the end of the day. She reports "extreme fatigue" on days that there are more severe stressors at work. Occ adequate energy. She reports that there are some days her mood is "good" and other days she feels "very sad and frustrated." Reports irritability on certain days. She reports "no concentration at all, with anything." She reports frequently forgetting things. She reports that she usually sleeps very well. Appetite has been ok. Has not been feeling like cooking meals. Denies SI.   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  Has noticed some mouth movements and jaw clinching.  Past Psychiatric Medication Trials: Abilify- Caused tremors. Has not noticed any worsening s/s since stopping it.  Cymbalta- Thinks this has been helpful for her depression. Has had severe discontinuation s/s with missed doses to the point of going to ER.  Lamictal- Has been helpful for mood. Reports taking long term. Gabapentin- Takes for RLS with  good response. Wellbutrin- Had convulsion when taking while drinking heavily.    Review of Systems:  Review of Systems  Musculoskeletal: Negative for gait problem.       Reports jaw tightening/clinching and possible involuntary jaw movements  Neurological: Negative for tremors.  Psychiatric/Behavioral:       Please refer to HPI    Medications: I have reviewed the patient's current medications.  Current Outpatient Medications  Medication Sig Dispense Refill  . busPIRone (BUSPAR) 15 MG tablet Take 1 tablet (15 mg total) by mouth 2 (two) times daily. 180 tablet 1  . CALCIUM PO Take by mouth.    . DULoxetine (CYMBALTA) 60 MG capsule Take 1 capsule (60 mg total) by mouth daily. 90 capsule 1  . estradiol (ESTRACE) 0.5 MG tablet Take 1 tablet (0.5 mg total) by mouth daily. 90 tablet 4  . gabapentin (NEURONTIN) 300 MG capsule Take 300 mg by mouth 3 (three) times daily.    Marland Kitchen lamoTRIgine (LAMICTAL) 200 MG tablet Take 1 tablet (200 mg total) by mouth daily. 90 tablet 1  . progesterone (PROMETRIUM) 100 MG capsule Take 1 capsule (100 mg total) by mouth at bedtime. 90 capsule 4  . TURMERIC PO Take by mouth.     No current facility-administered medications for this visit.     Medication Side Effects: None  Allergies:  Allergies  Allergen Reactions  . Codeine Nausea And Vomiting    Past Medical History:  Diagnosis Date  . Depression   . RLS (restless legs syndrome)     Family History  Problem Relation Age of  Onset  . Cancer Mother        leukemia   . Hypertension Mother   . Cancer Father        lung   . Alcohol abuse Father   . Diabetes Sister   . Hyperlipidemia Sister   . Hypertension Sister   . Depression Brother   . Diabetes Brother   . Hypertension Brother   . Alcohol abuse Maternal Uncle   . Alcohol abuse Paternal Aunt   . Alcohol abuse Maternal Grandfather   . Alcohol abuse Brother     Social History   Socioeconomic History  . Marital status: Divorced     Spouse name: Not on file  . Number of children: Not on file  . Years of education: Not on file  . Highest education level: Not on file  Occupational History  . Not on file  Social Needs  . Financial resource strain: Not on file  . Food insecurity    Worry: Not on file    Inability: Not on file  . Transportation needs    Medical: Not on file    Non-medical: Not on file  Tobacco Use  . Smoking status: Former Smoker    Quit date: 06/01/1996    Years since quitting: 22.6  . Smokeless tobacco: Never Used  Substance and Sexual Activity  . Alcohol use: Not Currently  . Drug use: Not Currently  . Sexual activity: Never    Comment: 1st intercourse- 57, partners- greater than 5,   Lifestyle  . Physical activity    Days per week: Not on file    Minutes per session: Not on file  . Stress: Not on file  Relationships  . Social Herbalist on phone: Not on file    Gets together: Not on file    Attends religious service: Not on file    Active member of club or organization: Not on file    Attends meetings of clubs or organizations: Not on file    Relationship status: Not on file  . Intimate partner violence    Fear of current or ex partner: Not on file    Emotionally abused: Not on file    Physically abused: Not on file    Forced sexual activity: Not on file  Other Topics Concern  . Not on file  Social History Narrative  . Not on file    Past Medical History, Surgical history, Social history, and Family history were reviewed and updated as appropriate.   Please see review of systems for further details on the patient's review from today.   Objective:   Physical Exam:  There were no vitals taken for this visit.  Physical Exam Constitutional:      General: She is not in acute distress.    Appearance: She is well-developed.  Musculoskeletal:        General: No deformity.  Neurological:     Mental Status: She is alert and oriented to person, place, and time.      Coordination: Coordination normal.  Psychiatric:        Attention and Perception: Attention and perception normal. She does not perceive auditory or visual hallucinations.        Mood and Affect: Affect is not labile, blunt, angry or inappropriate.        Speech: Speech normal.        Behavior: Behavior normal.        Thought Content: Thought content normal. Thought  content is not paranoid or delusional. Thought content does not include homicidal or suicidal ideation. Thought content does not include homicidal or suicidal plan.        Cognition and Memory: Cognition and memory normal.        Judgment: Judgment normal.     Comments: Mood presents as less anxious. Mood is appropriate to content and affect is congruent. Insight intact. No delusions.      Lab Review:     Component Value Date/Time   NA 140 10/27/2018 0256   K 3.7 10/27/2018 0256   CL 104 10/27/2018 0256   CO2 25 10/27/2018 0256   GLUCOSE 109 (H) 10/27/2018 0256   BUN 13 10/27/2018 0256   CREATININE 0.73 10/27/2018 0256   CREATININE 0.95 06/25/2018 0917   CALCIUM 9.3 10/27/2018 0256   PROT 6.8 10/27/2018 0256   ALBUMIN 4.2 10/27/2018 0256   AST 20 10/27/2018 0256   ALT 12 10/27/2018 0256   ALKPHOS 55 10/27/2018 0256   BILITOT 0.7 10/27/2018 0256   GFRNONAA >60 10/27/2018 0256   GFRAA >60 10/27/2018 0256       Component Value Date/Time   WBC 10.9 (H) 10/27/2018 0256   RBC 4.64 10/27/2018 0256   HGB 13.9 10/27/2018 0256   HCT 41.7 10/27/2018 0256   PLT 349 10/27/2018 0256   MCV 89.9 10/27/2018 0256   MCV 88.8 05/27/2013 1250   MCH 30.0 10/27/2018 0256   MCHC 33.3 10/27/2018 0256   RDW 12.6 10/27/2018 0256   LYMPHSABS 1.5 10/27/2018 0256   MONOABS 0.5 10/27/2018 0256   EOSABS 0.1 10/27/2018 0256   BASOSABS 0.0 10/27/2018 0256    No results found for: POCLITH, LITHIUM   No results found for: PHENYTOIN, PHENOBARB, VALPROATE, CBMZ   .res Assessment: Plan:   Patient seen for 30 minutes and greater  than 50% of visit spent counseling patient regarding possible treatment options.  Discussed either continuing current medications without changes or considering increase in Cymbalta to 90 mg daily to improve residual anxiety and depressive signs and symptoms.  Discussed that Cymbalta is approved by the FDA up to 60 mg daily and 90 mg dose is off label use.  Discussed potential benefits, risks, and side effects of increasing Cymbalta to 90 mg daily.  Patient reports that she prefers to continue medications as prescribed at this time and may consider increase in Cymbalta in the future if any of her signs and symptoms worsen.  Also discussed plan to to avoid any interruption in treatment if patient were to lose her current job and be without health insurance.  Discussed that medications could be refilled for a brief period of time until she had insurance coverage again if she is due for follow-up during lapse in insurance.  Discussed using good Rx to minimize cost of medications and that meds could be sent to multiple locations for lowest cost if needed. Will continue current medications without changes. Recommend continuing psychotherapy with Rinaldo Cloud, LCSW. Patient to follow-up with this provider in 3 months or sooner if clinically indicated. Patient advised to contact office with any questions, adverse effects, or acute worsening in signs and symptoms.  Kirstine was seen today for follow-up.  Diagnoses and all orders for this visit:  Depression, unspecified depression type -     DULoxetine (CYMBALTA) 60 MG capsule; Take 1 capsule (60 mg total) by mouth daily. -     lamoTRIgine (LAMICTAL) 200 MG tablet; Take 1 tablet (200 mg total) by mouth daily.  Generalized anxiety disorder -     busPIRone (BUSPAR) 15 MG tablet; Take 1 tablet (15 mg total) by mouth 2 (two) times daily. -     DULoxetine (CYMBALTA) 60 MG capsule; Take 1 capsule (60 mg total) by mouth daily. -     lamoTRIgine (LAMICTAL) 200 MG  tablet; Take 1 tablet (200 mg total) by mouth daily.     Please see After Visit Summary for patient specific instructions.  Future Appointments  Date Time Provider [place]  01/31/2019 12:00 PM Shanon Ace, LCSW [facility] None  02/21/2019 12:00 PM Shanon Ace, LCSW [facility] None  04/18/2019  9:30 AM [name], PMHNP [facility] None    No orders of the defined types were placed in this encounter.   -------------------------------

## 2019-01-31 ENCOUNTER — Other Ambulatory Visit: Payer: Self-pay

## 2019-01-31 ENCOUNTER — Ambulatory Visit (INDEPENDENT_AMBULATORY_CARE_PROVIDER_SITE_OTHER): Payer: PRIVATE HEALTH INSURANCE | Admitting: Psychiatry

## 2019-01-31 DIAGNOSIS — F411 Generalized anxiety disorder: Secondary | ICD-10-CM

## 2019-01-31 NOTE — Progress Notes (Signed)
      [facility] Counselor/Therapist Progress Note  Patient ID: Kara Webster, MRN: [id number],    Date: 01/31/2019  Time Spent: 60 minutes  12:00noon to 1:00pm  Treatment Type: Individual Therapy  Reported Symptoms:  anxiety  Mental Status Exam:  Appearance:   Casual     Behavior:  Appropriate, sharing  Motor:  Normal  Speech/Language:   Normal Rate  Affect:  Depressed, Labile and angry  Mood:  Irritable,depressed,  angry,   Thought process:  Goal -directed (once she discharged some anger and frustration)  Thought content:    WNL  Sensory/Perceptual disturbances:    WNL  Orientation:  oriented to person, place, time/date, situation, day of week, month of year and year  Attention:  Good  Concentration:  Good  Memory:  Good-can be forgetful under stress  Fund of knowledge:   Good  Insight:    Good  Judgment:   Good  Impulse Control:  Good   Risk Assessment: Danger to Self:  No Self-injurious Behavior: No Danger to Others: No Duty to Warn:no Physical Aggression / Violence:No  Access to Firearms a concern: No  Gang Involvement:No   Subjective:  Patient in today reporting anxiety, depression, frustration. Very rough week at work. Feels hurt and frustrated. Meeting with boss did not go well. (all details not shared due to patient privacy needs). Upset and has some decisions to make that are difficult.  Interventions: Cognitive Behavioral Therapy and Solution-Oriented/Positive Psychology  Diagnosis:   ICD-10-CM   1. Generalized anxiety disorder  F41.1     Plan of Care: Patient not signing tx plan on computer screen due to [place].  Treatment Goals: Goals remain on tx plan as patient works on strategies to meet her goals.Progress will be documented each session in "Progress" section of Plan.  Long term goal: Reduce overall level, frequency, and intensity of the anxiety so that daily functioning is not impaired.  Short term goal: Increase understanding of  beliefs and messages that produce worry and anxiety, and be able to develop more adaptive and positive beliefs and messages.  Strategies: 1. Explore cognitive messages that lead to anxiety and learn more adaptive cognitive patterns that do not support anxiety, and do support calmness, rational thinking, and self-confidence. 2. Identify, challenge, and replace fearful, indecisive, and anxiety-provoking self-talk with positive, reality-based, and empowering self-talk.  PROGRESS: Patient dealing with very stressful situation at work, and showed a lot of courage and effort in her work today. Depressed, anxious, hurt, mixed feeling about decision-making.  Processing lots of feelings, thoughts, and some fears re: work situation. Reviewed some from last session and things at work have actually intensified more recently.  Worked on stress reduction, self-esteem, and believing in herself.  Focused more heavily on Strategy #2 as it relates to her concerns and symptoms from work problems, which is very much her main concern today due to latest confrontation.  She was able to eventually reach a point where she seemed to feel a bit less fearful and somewhat more empowered. Denies any SI.  Goal review with patient and progress/strengths noted with her.   Next appt within 1-2 weeks.   Shanon Ace, LCSW

## 2019-02-21 ENCOUNTER — Other Ambulatory Visit: Payer: Self-pay

## 2019-02-21 ENCOUNTER — Ambulatory Visit (INDEPENDENT_AMBULATORY_CARE_PROVIDER_SITE_OTHER): Payer: PRIVATE HEALTH INSURANCE | Admitting: Psychiatry

## 2019-02-21 DIAGNOSIS — F411 Generalized anxiety disorder: Secondary | ICD-10-CM

## 2019-02-21 NOTE — Progress Notes (Signed)
      [facility] Counselor/Therapist Progress Note  Patient ID: BIANA COUSENS, MRN: [id number],    Date: 02/21/2019  Time Spent: 60 minutes  12:00noon to 1:00pm   Treatment Type: Individual Therapy  Reported Symptoms:  Anxiety, depression  Mental Status Exam:  Appearance:   Casual     Behavior:  Appropriate and Sharing  Motor:  Normal  Speech/Language:   Normal Rate  Affect:  anxious, some depression  Mood:  anxious  Thought process:  normal  Thought content:    WNL  Sensory/Perceptual disturbances:    WNL  Orientation:  oriented to person, place, time/date, situation, day of week, month of year and year  Attention:  Good  Concentration:  Fair  Memory:  "forgetfulness"; PCP is aware.  Fund of knowledge:   Good  Insight:    Good  Judgment:   Good  Impulse Control:  Good   Risk Assessment: Danger to Self:  No Self-injurious Behavior: No Danger to Others: No Duty to Warn:no Physical Aggression / Violence:No  Access to Firearms a concern: No  Gang Involvement:No   Subjective:  Patient reports continued frustration, confusion at work, anxiety, depression.     Interventions: Solution-Oriented/Positive Psychology  Diagnosis:   ICD-10-CM   1. Generalized anxiety disorder  F41.1      Plan of Care: Patient not signing tx plan on computer screen due to [place].  Treatment Goals: Goals remain on tx plan as patient works on strategies to meet her goals.Progress will be documented each session in "Progress" section of Plan.  Long term goal: Reduce overall level, frequency, and intensity of the anxiety so that daily functioning is not impaired.  Short term goal: Increase understanding of beliefs and messages that produce worry and anxiety, and be able to develop more adaptive and positive beliefs and messages.  Strategies: 1. Explore cognitive messages that lead to anxiety and learn more adaptive cognitive patterns that do not support anxiety, and do support  calmness, rational thinking, and self-confidence. 2. Identify, challenge, and replace fearful, indecisive, and anxiety-provoking self-talk with positive, reality-based, and empowering self-talk.  PROGRESS: Patient is making some progress in her goal work and trying to better manage stress and anxiety, especially at work where she is having major problems and negative interaction with boss.  Still having significant issues at work but one note of progress per patent is "I am no longer feeling the urge to run out of the work building, and that was where I was at when I first came for therapy."  Depression is some less, but still anxious and on-edge at times, and still has uncertainties as to what she needs to do for herself as far as staying with the job.  She knows "quality of life is important".  Is working to try and improve her self-talk, set better boundaries, not excessively watching disturbing news online or TV, and have contact with others that care about her, exercise, and get improved sleep.  Denies any SI.  Goal review with patient and progress/efforts noted.   Next appt within 2 weeks.   Shanon Ace, LCSW

## 2019-04-04 ENCOUNTER — Ambulatory Visit (INDEPENDENT_AMBULATORY_CARE_PROVIDER_SITE_OTHER): Payer: PRIVATE HEALTH INSURANCE | Admitting: Psychiatry

## 2019-04-04 ENCOUNTER — Other Ambulatory Visit: Payer: Self-pay

## 2019-04-04 DIAGNOSIS — F411 Generalized anxiety disorder: Secondary | ICD-10-CM | POA: Diagnosis not present

## 2019-04-04 NOTE — Progress Notes (Signed)
[facility] Counselor/Therapist Progress Note  Patient ID: Kara Webster, MRN: [id number],    Date: 04/04/2019  Time Spent: 60 minutes  12:00noon to 1:00pm  Treatment Type: Individual Therapy  Reported Symptoms: anxiety, confusion, anger  Mental Status Exam:  Appearance:   Neat     Behavior:  Appropriate, Sharing and Motivated  Motor:  Normal  Speech/Language:   Normal Rate  Affect:  anxiety  Mood:  anxious  Thought process:  normal  Thought content:    WNL  Sensory/Perceptual disturbances:    WNL  Orientation:  oriented to person, place, time/date, situation, day of week, month of year and year  Attention:  Good  Concentration:  Good  Memory:  WNL, some forgetfulness at times  Fund of knowledge:   Good  Insight:    Good  Judgment:   Good  Impulse Control:  Fair, can be poor at times under stress   Risk Assessment: Danger to Self:  No Self-injurious Behavior: No Danger to Others: No Duty to Warn:no Physical Aggression / Violence:No  Access to Firearms a concern: No  Gang Involvement:No   Subjective: Patient in today with above named symptoms. Reports "more clarity about my job and am doing better." States she feels that she had some type of "brain fog", but was much better at work after Christmas break.  A really good friend of hers thinks that patient's change was more related to a sudden and healthy change in her diet.  Eating more regularly and healthier, protein, vegetables and other healthy choices versus so much processed foods. This has resulted and/or supported improvement.  Patient shared another concern that occurred recently where she became very angry and over-reacted. (Not all details included in this note due to patient privacy needs.)  States that it has happened before where she has outbursts of anger and yelling that comes on very quickly.  Is to see her med provider [name]) on Feb. 5, 2021.  Interventions: Solution-Oriented/Positive  Psychology and Ego-Supportive  Diagnosis:   ICD-10-CM   1. Generalized anxiety disorder  F41.1     Plan of Care: Patient not signing tx plan on computer screen due to [place].  Treatment Goals: Goals remain on tx plan as patient works on strategies to meet her goals.Progress will be documented each session in "Progress" section of Plan.  Long term goal: Reduce overall level, frequency, and intensity of the anxiety so that daily functioning is not impaired.  Short term goal: Increase understanding of beliefs and messages that produce worry and anxiety, and be able to develop more adaptive and positive beliefs and messages.  Strategies: 1. Explore cognitive messages that lead to anxiety and learn more adaptive cognitive patterns that do not support anxiety, and do support calmness, rational thinking, and self-confidence. 2. Identify, challenge, and replace fearful, indecisive, and anxiety-provoking self-talk with positive, reality-based, and empowering self-talk.  PROGRESS: Patient's behavior at work has improved a lot so she is feeling good about that.   Depression has lifted some since last appt.  Acknowledges sudden bursts of anger at times that can escalate but not a danger to others or self. (All info not shared in note due to patient privacy needs.)  Discussed her concerns at length and she is to be self-monitoring anger between now and next appt, noting the intensity and her level of expression of that anger.  Did consider some calming exercises today before time ran out, and will follow up on these next visit.  She was more calm and grounded upon leaving session today.  Denies and SI or HI. Reviewed goals and progress/new challenges noted with patient.    Next visit within 2 weeks.   Shanon Ace, LCSW

## 2019-04-18 ENCOUNTER — Ambulatory Visit (INDEPENDENT_AMBULATORY_CARE_PROVIDER_SITE_OTHER): Payer: PRIVATE HEALTH INSURANCE | Admitting: Psychiatry

## 2019-04-18 ENCOUNTER — Ambulatory Visit: Payer: PRIVATE HEALTH INSURANCE | Admitting: Psychiatry

## 2019-04-18 ENCOUNTER — Other Ambulatory Visit: Payer: Self-pay

## 2019-04-18 DIAGNOSIS — F32A Depression, unspecified: Secondary | ICD-10-CM

## 2019-04-18 DIAGNOSIS — F329 Major depressive disorder, single episode, unspecified: Secondary | ICD-10-CM

## 2019-04-18 NOTE — Progress Notes (Signed)
      [facility] Counselor/Therapist Progress Note  Patient ID: Kara Webster, MRN: [id number],    Date: 04/18/2019  Time Spent: 3 mintues  8:00am to 9:00am  Treatment Type: Individual Therapy  Reported Symptoms: anger, frustration with herself, anxiety, depression  Mental Status Exam:  Appearance:   Casual     Behavior:  Appropriate, Sharing and some agitation  Motor:  Normal  Speech/Language:   Normal Rate  Affect:  anxious, tearful, depressed  Mood:  anxious, depressed and irritable  Thought process:  goal directed  Thought content:    WNL  Sensory/Perceptual disturbances:    WNL  Orientation:  oriented to person, place, time/date, situation, day of week, month of year and year  Attention:  Good  Concentration:  Good  Memory:  "some forgetfulness"  Fund of knowledge:   Good  Insight:    Good/Fair  Judgment:   Good / Fair  Impulse Control:  Good / Fair   Risk Assessment: Danger to Self:  No Self-injurious Behavior: No Danger to Others: No Duty to Warn:no Physical Aggression / Violence:No  Access to Firearms a concern: No  Gang Involvement:No   Subjective: Patient in today reporting anger, confusion over decisions, fatigue.  Constantly remembering the negative things I do. Less attention to the positives.  Interventions: Cognitive Behavioral Therapy and Solution-Oriented/Positive Psychology  Diagnosis:   ICD-10-CM   1. Depression, unspecified depression type  F32.9     Plan of Care: Patient not signing tx plan on computer screen due to [place].  Treatment Goals: Goals remain on tx plan as patient works on strategies to meet her goals.Progress will be documented each session in "Progress" section of Plan.  Long term goal: Reduce overall level, frequency, and intensity of the anxiety so that daily functioning is not impaired.  Short term goal: Increase understanding of beliefs and messages that produce worry and anxiety, and be able to develop more  adaptive and positive beliefs and messages.  Strategies: 1. Explore cognitive messages that lead to anxiety and learn more adaptive cognitive patterns that do not support anxiety, and do support calmness, rational thinking, and self-confidence. 2. Identify, challenge, and replace fearful, indecisive, and anxiety-provoking self-talk with positive, reality-based, and empowering self-talk.  PROGRESS: Patient today shares the symptoms noted above.  Depression currently strongere than anxiety. She is still doing better at her job however had a recent "episode of anger".  Explained in detail how her moods can shift, how she does not like herself, very strong feelings of not liking herself, assumes the negative, overthinking. Self esteem issues definitely blocking her at this point.  Unloaded a lot of info today in reference to self esteem issues and is quite negative, anxious, lacking belief in self, sometimes self-sabotage, and feeling "I'm 63 and it's too late to change. Would like more emotional modulation. Confronted some of her self-negating.  Discussed (CBT)  how thoughts impact feelings and to follow up next session. Homework assignment re: self perception, and commitment to change. Denies any SI. Goal review and progress/challenges notes with patient.   Next appt within 2 weeks.   Shanon Ace, LCSW

## 2019-04-25 ENCOUNTER — Ambulatory Visit (INDEPENDENT_AMBULATORY_CARE_PROVIDER_SITE_OTHER): Payer: PRIVATE HEALTH INSURANCE | Admitting: Psychiatry

## 2019-04-25 ENCOUNTER — Other Ambulatory Visit: Payer: Self-pay

## 2019-04-25 DIAGNOSIS — F411 Generalized anxiety disorder: Secondary | ICD-10-CM

## 2019-04-25 DIAGNOSIS — F32A Depression, unspecified: Secondary | ICD-10-CM

## 2019-04-25 DIAGNOSIS — F329 Major depressive disorder, single episode, unspecified: Secondary | ICD-10-CM | POA: Diagnosis not present

## 2019-04-25 MED ORDER — LAMOTRIGINE 100 MG PO TABS: 250.0000 mg | ORAL_TABLET | Freq: Every day | ORAL | 0 refills | Status: DC

## 2019-04-25 NOTE — Progress Notes (Signed)
Kara JOU [id number] 11/15/1955 64 y.o.  Subjective:   Patient ID:  Kara Webster is a 63 y.o. (DOB 01-22-1956) female.  Chief Complaint:  Chief Complaint  Patient presents with  . Follow-up     Medication Management  . Anxiety     Medication Management  . Depression     Medication Management  . Other    Anger Episodes    HPI Kara Webster presents to the office today for follow-up of mood and anxiety. Reports that she had a period where she was not able to function at work. She reports, "I don't know what happened..." and that after Christmas things came together at work and she was able to concentrate. She reports that she is now functioning the best she ever has at work. She reports that her energy is adequate throughout the day and is low by the end of the day. Sleeping well and averaging 6-8 hours a night. Appetite has been decreased. Denies any change in weight. Concentration has been improved. Denies SI- "I don't want to be me."   She reports that she had a severe episode of anger about a month ago and another episode of anger with a coworker- "I just flipped...afterwards I felt awful." Reports that she "tore a restaurant up" when she had an angry episode when a woman refused to give her a refund for a pizza that was inedible. She reports that this episode lasted about 15-20 minutes. She reports that there had been several stressors leading up to event at work. Reports that she had similar episodes in years past. She reports that she had significant anxiety after these episodes. She reports that she has some anxiety in the morning and sometimes this is triggered by thoughts and memories of the past. Denies any severe irritability or frustration. She denies persistent depression and this typically resolves after some guilt in the mornings. Denies elevated mood, increased talkativeness, increased goal directed activity.   She reports that she spent $500 on a small painting and  then regretted it and reports that this was an impulsive purchase. Reports that she bought a quilt for almost $300 for someone that she knew was to big and then regretted it. Has also loaned daughter money since she is going through a divorce. Has been babysitting grandchildren more.   Reports that in the past she had an affair with someone that she describes as "explosive"    She reports that her relationship has been going well.   Past Psychiatric Medication Trials: Abilify- Caused tremors. Has not noticed any worsening s/s since stopping it.  Cymbalta- Thinks this has been helpful for her depression. Has had severe discontinuation s/s with missed doses to the point of going to ER.  Lamictal- Has been helpful for mood. Reports taking long term. Gabapentin- Takes for RLS with good response. Wellbutrin- Had convulsion when taking while drinking heavily.    Review of Systems:  Review of Systems  Musculoskeletal: Negative for gait problem.  Neurological: Negative for tremors.  Psychiatric/Behavioral:       Please refer to HPI    Medications: I have reviewed the patient's current medications.  Current Outpatient Medications  Medication Sig Dispense Refill  . busPIRone (BUSPAR) 15 MG tablet Take 1 tablet (15 mg total) by mouth 2 (two) times daily. 180 tablet 1  . CALCIUM PO Take by mouth.    . DULoxetine (CYMBALTA) 60 MG capsule Take 1 capsule (60 mg total) by mouth daily. [place]  capsule 1  . estradiol (ESTRACE) 0.5 MG tablet Take 1 tablet (0.5 mg total) by mouth daily. 90 tablet 4  . gabapentin (NEURONTIN) 300 MG capsule Take 300 mg by mouth 3 (three) times daily.    Marland Kitchen lamoTRIgine (LAMICTAL) 100 MG tablet Take 2.5 tablets (250 mg total) by mouth daily. 90 tablet 0  . progesterone (PROMETRIUM) 100 MG capsule Take 1 capsule (100 mg total) by mouth at bedtime. 90 capsule 4  . TURMERIC PO Take by mouth.     No current facility-administered medications for this visit.    Medication Side  Effects: None  Allergies:  Allergies  Allergen Reactions  . Codeine Nausea And Vomiting    Past Medical History:  Diagnosis Date  . Depression   . RLS (restless legs syndrome)     Family History  Problem Relation Age of Onset  . Cancer Mother        leukemia   . Hypertension Mother   . Cancer Father        lung   . Alcohol abuse Father   . Diabetes Sister   . Hyperlipidemia Sister   . Hypertension Sister   . Depression Brother   . Diabetes Brother   . Hypertension Brother   . Alcohol abuse Maternal Uncle   . Alcohol abuse Paternal Aunt   . Alcohol abuse Maternal Grandfather   . Alcohol abuse Brother     Social History   Socioeconomic History  . Marital status: Divorced    Spouse name: Not on file  . Number of children: Not on file  . Years of education: Not on file  . Highest education level: Not on file  Occupational History  . Not on file  Tobacco Use  . Smoking status: Former Smoker    Quit date: 06/01/1996    Years since quitting: 22.9  . Smokeless tobacco: Never Used  Substance and Sexual Activity  . Alcohol use: Not Currently  . Drug use: Not Currently  . Sexual activity: Never    Comment: 1st intercourse- 89, partners- greater than 5,   Other Topics Concern  . Not on file  Social History Narrative  . Not on file   Social Determinants of Health   Financial Resource Strain:   . Difficulty of Paying Living Expenses: Not on file  Food Insecurity:   . Worried About Charity fundraiser in the Last Year: Not on file  . Ran Out of Food in the Last Year: Not on file  Transportation Needs:   . Lack of Transportation (Medical): Not on file  . Lack of Transportation (Non-Medical): Not on file  Physical Activity:   . Days of Exercise per Week: Not on file  . Minutes of Exercise per Session: Not on file  Stress:   . Feeling of Stress : Not on file  Social Connections:   . Frequency of Communication with Friends and Family: Not on file  . Frequency  of Social Gatherings with Friends and Family: Not on file  . Attends Religious Services: Not on file  . Active Member of Clubs or Organizations: Not on file  . Attends Archivist Meetings: Not on file  . Marital Status: Not on file  Intimate Partner Violence:   . Fear of Current or Ex-Partner: Not on file  . Emotionally Abused: Not on file  . Physically Abused: Not on file  . Sexually Abused: Not on file    Past Medical History, Surgical history, Social history,  and Family history were reviewed and updated as appropriate.   Please see review of systems for further details on the patient's review from today.   Objective:   Physical Exam:  BP 117/70   Pulse 62   Physical Exam Constitutional:      General: She is not in acute distress.    Appearance: She is well-developed.  Musculoskeletal:        General: No deformity.  Neurological:     Mental Status: She is alert and oriented to person, place, and time.     Coordination: Coordination normal.  Psychiatric:        Attention and Perception: Attention and perception normal. She does not perceive auditory or visual hallucinations.        Mood and Affect: Mood normal. Mood is not anxious or depressed. Affect is not labile, blunt, angry or inappropriate.        Speech: Speech normal.        Behavior: Behavior normal.        Thought Content: Thought content normal. Thought content is not paranoid or delusional. Thought content does not include homicidal or suicidal ideation. Thought content does not include homicidal or suicidal plan.        Cognition and Memory: Cognition and memory normal.        Judgment: Judgment normal.     Comments: Insight intact     Lab Review:     Component Value Date/Time   NA 140 10/27/2018 0256   K 3.7 10/27/2018 0256   CL 104 10/27/2018 0256   CO2 25 10/27/2018 0256   GLUCOSE 109 (H) 10/27/2018 0256   BUN 13 10/27/2018 0256   CREATININE 0.73 10/27/2018 0256   CREATININE 0.95  06/25/2018 0917   CALCIUM 9.3 10/27/2018 0256   PROT 6.8 10/27/2018 0256   ALBUMIN 4.2 10/27/2018 0256   Webster 20 10/27/2018 0256   ALT 12 10/27/2018 0256   ALKPHOS 55 10/27/2018 0256   BILITOT 0.7 10/27/2018 0256   GFRNONAA >60 10/27/2018 0256   GFRAA >60 10/27/2018 0256       Component Value Date/Time   WBC 10.9 (H) 10/27/2018 0256   RBC 4.64 10/27/2018 0256   HGB 13.9 10/27/2018 0256   HCT 41.7 10/27/2018 0256   PLT 349 10/27/2018 0256   MCV 89.9 10/27/2018 0256   MCV 88.8 05/27/2013 1250   MCH 30.0 10/27/2018 0256   MCHC 33.3 10/27/2018 0256   RDW 12.6 10/27/2018 0256   LYMPHSABS 1.5 10/27/2018 0256   MONOABS 0.5 10/27/2018 0256   EOSABS 0.1 10/27/2018 0256   BASOSABS 0.0 10/27/2018 0256    No results found for: POCLITH, LITHIUM   No results found for: PHENYTOIN, PHENOBARB, VALPROATE, CBMZ   .res Assessment: Plan:   Pt counseled re: mood s/s and s/s of hypomania. Discussed that increased irritability, impulsivity, and spending could be potential s/s of hypomania. Discussed increasing Lamictal to improve mood stabilization and minimize hypomanic s/s. Will increase Lamictal to 250 mg po qd. Discussed potential benefits, risks, and side side effects of increasing Lamictal and advised pt to contact office immediately if rash occurs.  Continue Cymbalta 60 mg po qd for depression and anxiety.  Continue Buspar 15 mg po BID for anxiety.  Pt to f/u in 4 weeks or sooner if clinically indicated.  Recommend continuing psychotherapy with Rinaldo Cloud, LCSW.  Patient advised to contact office with any questions, adverse effects, or acute worsening in signs and symptoms.  Sahira was seen today  for follow-up, anxiety, depression and other.  Diagnoses and all orders for this visit:  Generalized anxiety disorder -     lamoTRIgine (LAMICTAL) 100 MG tablet; Take 2.5 tablets (250 mg total) by mouth daily.  Depression, unspecified depression type -     lamoTRIgine (LAMICTAL) 100 MG  tablet; Take 2.5 tablets (250 mg total) by mouth daily.     Please see After Visit Summary for patient specific instructions.  Future Appointments  Date Time Provider [place]  05/09/2019 11:00 AM Shanon Ace, LCSW [facility] None  05/16/2019 10:00 AM Shanon Ace, LCSW [facility] None  05/23/2019 11:00 AM [name], PMHNP [facility] None    No orders of the defined types were placed in this encounter.   -------------------------------

## 2019-04-26 ENCOUNTER — Encounter: Payer: Self-pay | Admitting: Psychiatry

## 2019-05-02 ENCOUNTER — Ambulatory Visit: Payer: PRIVATE HEALTH INSURANCE | Admitting: Psychiatry

## 2019-05-07 ENCOUNTER — Other Ambulatory Visit: Payer: Self-pay | Admitting: Psychiatry

## 2019-05-07 ENCOUNTER — Telehealth: Payer: Self-pay | Admitting: Psychiatry

## 2019-05-07 ENCOUNTER — Other Ambulatory Visit: Payer: Self-pay

## 2019-05-07 ENCOUNTER — Ambulatory Visit (INDEPENDENT_AMBULATORY_CARE_PROVIDER_SITE_OTHER): Payer: PRIVATE HEALTH INSURANCE | Admitting: Psychiatry

## 2019-05-07 DIAGNOSIS — F329 Major depressive disorder, single episode, unspecified: Secondary | ICD-10-CM | POA: Diagnosis not present

## 2019-05-07 DIAGNOSIS — F411 Generalized anxiety disorder: Secondary | ICD-10-CM

## 2019-05-07 DIAGNOSIS — F32A Depression, unspecified: Secondary | ICD-10-CM

## 2019-05-07 NOTE — Progress Notes (Signed)
[facility] Counselor/Therapist Progress Note  Patient ID: Kara Webster, MRN: [id number],    Date: 05/07/2019  Time Spent: 60 minutes  8:00am to 9:00am  Treatment Type: Individual Therapy  Reported Symptoms: depression, anxiety,   Mental Status Exam:  Appearance:   Casual     Behavior:  Appropriate and Sharing  Motor:  Normal  Speech/Language:   Normal Rate  Affect:  anxious, depressed  Mood:  anxious, depressed and sad  Thought process:  goal directed  Thought content:    WNL  Sensory/Perceptual disturbances:    WNL  Orientation:  oriented to person, place, time/date, situation, day of week, month of year and year  Attention:  Good  Concentration:  Good  Memory:  some forgetfulness  Fund of knowledge:   Good  Insight:    Good  Judgment:   Good / Fair  Impulse Control:  Good / Fair   Risk Assessment: Danger to Self:  No Self-injurious Behavior: No Danger to Others: No Duty to Warn:no Physical Aggression / Violence:No  Access to Firearms a concern: No  Gang Involvement:No   Subjective: Patient in today with increased depression and has had recent SI and states "I would not harm myself because of my kids and grandkids." .  Rough week, lost job on Monday. Denies any SI at this time and has no plan nor intent. Close contact with 2 adult daughters.    Interventions: Cognitive Behavioral Therapy and Solution-Oriented/Positive Psychology  Diagnosis:   ICD-10-CM   1. Depression, unspecified depression type  F32.9     Plan of Care: Patient not signing tx plan on computer screen due to [place].  Treatment Goals: Goals remain on tx plan as patient works on strategies to meet her goals.Progress will be documented each session in "Progress" section of Plan.  Long term goal: Reduce overall level, frequency, and intensity of the anxiety so that daily functioning is not impaired.  Short term goal: Increase understanding of beliefs and messages that produce worry  and anxiety, and be able to develop more adaptive and positive beliefs and messages.  Strategies: 1. Explore cognitive messages that lead to anxiety and learn more adaptive cognitive patterns that do not support anxiety, and do support calmness, rational thinking, and self-confidence. 2. Identify, challenge, and replace fearful, indecisive, and anxiety-provoking self-talk with positive, reality-based, and empowering self-talk.  PROGRESS: Patient in today more depressed, sad, and anxious.  Made more mistakes at work and ended up losing her job 2 days ago.  Mixed feelings of depression, anxiety, sadness.  Positives for her are: grandkids, daughters, "less stress" without the job as the stress was really bad for me, and more time with family. Mood shifting has been worse this week but not extreme. Self-esteem is worse and we discussed this today helping patient realize her situation does not mean "she is a failure."  There were circumstance with her job and boss that made it not a good fit for her but she was trying ot make the best of it.  Feeling like a failure, ashamed, embarassed, "wish I could learn better", and" I know I made little and big mistakes."  Focused on her negative/anxious thoughts and how to replace them with more positive, realistic, and empowering thoughts.  Reaffirmed her strengths with her.  Goal review and progress noted with patient.   Next appt wiithin 2 weeks.   Kara Ace, LCSW

## 2019-05-07 NOTE — Telephone Encounter (Signed)
Pt was in office today to see DD. Stated she is taking 200 mg Lamictal and should be taking 250 mg per chart. Ask if that's a big different in dose. Return call @ [phone]

## 2019-05-08 NOTE — Telephone Encounter (Signed)
Left patient message to call back.

## 2019-05-09 ENCOUNTER — Ambulatory Visit: Payer: PRIVATE HEALTH INSURANCE | Admitting: Psychiatry

## 2019-05-16 ENCOUNTER — Ambulatory Visit (INDEPENDENT_AMBULATORY_CARE_PROVIDER_SITE_OTHER): Payer: Self-pay | Admitting: Psychiatry

## 2019-05-16 ENCOUNTER — Other Ambulatory Visit: Payer: Self-pay

## 2019-05-16 DIAGNOSIS — F411 Generalized anxiety disorder: Secondary | ICD-10-CM

## 2019-05-16 NOTE — Progress Notes (Signed)
[facility] Counselor/Therapist Progress Note  Patient ID: Kara Webster, MRN: [id number],    Date: 05/16/2019  Time Spent: 60 minutes  10:00am to 11:00am  Treatment Type: Individual Therapy  Reported Symptoms: anxiety,sadness, feeling a little lost since losing her job, depressed (but some better)  Mental Status Exam:  Appearance:   Casual     Behavior:  Appropriate, Sharing and Motivated  Motor:  Normal  Speech/Language:   Normal Rate  Affect:  anxious, sad, some depression  Mood:  anxious, depressed and sad  Thought process:  normal  Thought content:    WNL  Sensory/Perceptual disturbances:    WNL  Orientation:  oriented to person, place, time/date, situation, day of week, month of year and year  Attention:  Good  Concentration:  Good  Memory:  WNL at times, some forgetfulness at other times  Fund of knowledge:   Good  Insight:    Good  Judgment:   Good  Impulse Control:  Good / Fair   Risk Assessment: Danger to Self:  No Self-injurious Behavior: No Danger to Others: No Duty to Warn:no Physical Aggression / Violence:No  Access to Firearms a concern: No  Gang Involvement:No   Subjective: Patient anxious and still some depression but improving some.  Sadness re:job situation and is looking for work.  Is having some moments of "relief" as well.  Interventions: Cognitive Behavioral Therapy and Solution-Oriented/Positive Psychology  Diagnosis:   ICD-10-CM   1. Generalized anxiety disorder  F41.1     Plan of Care: Patient not signing tx plan on computer screen due to [place].  Treatment Goals: Goals remain on tx plan as patient works on strategies to meet her goals.Progress will be documented each session in "Progress" section of Plan.  Long term goal: Reduce overall level, frequency, and intensity of the anxiety so that daily functioning is not impaired.  Short term goal: Increase understanding of beliefs and messages that produce worry and anxiety,  and be able to develop more adaptive and positive beliefs and messages.  Strategies: 1. Explore cognitive messages that lead to anxiety and learn more adaptive cognitive patterns that do not support anxiety, and do support calmness, rational thinking, and self-confidence. 2. Identify, challenge, and replace fearful, indecisive, and anxiety-provoking self-talk with positive, reality-based, and empowering self-talk.  PROGRESS: Patient in today feeling anxious, sad, depressed (although some better), and feeling "somewhat lost after losing my job recently."  Dressed casually neat and affect is less depressed. Has applied for unemployment and already looking for jobs.  "I do not feel like a failure anymore, and sometimes feel like a weight has been lifted.  Still having some social anxiety. Anxiety "is going to be an issue for me having to apply for jobs and go in and meet people."  Does better 1:1 verus group of people.  She gave examples of anxious Thoughts that she has in these type situations.  We then worked on the process of interrupting those thoughts and replace them with more positive, reality-based, and empowering thought patterns that do not support anxiety nor depression. Reminded of her "positives" including her daughters, grandkids,, friends, and having less stress right now, although she is definitely wanting to find another job.  Self-esteem still needs work and she is working on that with more positive self-talk and self-care including contacts with others, getting outside more, and physical exercise (enjoys walks).   Goal review and progress/challenges/efforts  noted with patient.  Next appt within 2 weeks.  Shanon Ace, LCSW

## 2019-05-23 ENCOUNTER — Encounter: Payer: Self-pay | Admitting: Psychiatry

## 2019-05-23 ENCOUNTER — Other Ambulatory Visit: Payer: Self-pay

## 2019-05-23 ENCOUNTER — Ambulatory Visit (INDEPENDENT_AMBULATORY_CARE_PROVIDER_SITE_OTHER): Payer: Self-pay | Admitting: Psychiatry

## 2019-05-23 DIAGNOSIS — F329 Major depressive disorder, single episode, unspecified: Secondary | ICD-10-CM

## 2019-05-23 DIAGNOSIS — F32A Depression, unspecified: Secondary | ICD-10-CM

## 2019-05-23 DIAGNOSIS — F411 Generalized anxiety disorder: Secondary | ICD-10-CM

## 2019-05-23 MED ORDER — DULOXETINE HCL 60 MG PO CPEP: 60.0000 mg | ORAL_CAPSULE | Freq: Every day | ORAL | 1 refills | Status: DC

## 2019-05-23 MED ORDER — LAMOTRIGINE 200 MG PO TABS: 200.0000 mg | ORAL_TABLET | Freq: Every day | ORAL | 1 refills | Status: DC

## 2019-05-23 MED ORDER — BUSPIRONE HCL 15 MG PO TABS: 15.0000 mg | ORAL_TABLET | Freq: Two times a day (BID) | ORAL | 1 refills | Status: DC

## 2019-05-23 MED ORDER — LAMOTRIGINE 100 MG PO TABS: 100.0000 mg | ORAL_TABLET | Freq: Every day | ORAL | 0 refills | Status: DC

## 2019-05-23 NOTE — Progress Notes (Signed)
SHAKEETA MUSICK NP:[id number] 1955-05-08 64 y.o.  Subjective:   Patient ID:  Kara Webster is a 64 y.o. (DOB May 03, 1955) female.  Chief Complaint:  Chief Complaint  Patient presents with  . Follow-up    Mood and anxiety    HPI Kara Webster presents to the office today for follow-up of mood and anxiety. She reports that she has been taking Lamictal 300 mg po qd since she had difficulty breaking tabs in half.  "I feel extremely relaxed." She reports that anxiety has been lower overall. Reports that she has social anxiety and that she has anxiety with working interviews. Had a phone interview yesterday and another today. She denies any significant depression. She has had some irritability. Has not had any recent angry outbursts. Has had some impulsivity to include spending on things she does not need. Denies elevated mood. Energy has been good. Denies excessive energy or increased goal directed activity. Sleep has been "erratic" and has been staying up later some nights since she does not have work and some mornings is waking up early to take her granddaughter to the bus stop. Appetite has been ok. Concentration has been good. Denies SI.   She reports that mood has been more stable with less fluctuations in mood. Bought an expensive purse yesterday and plans to return this.   She reports that she lost her job on 05/05/19. She reports that she has updated her resume and is looking for employment.   Has had both covid vaccines.    Past Psychiatric Medication Trials: Abilify- Caused tremors. Has not noticed any worsening s/s since stopping it.  Cymbalta- Thinks this has been helpful for her depression. Has had severe discontinuation s/s with missed doses to the point of going to ER.  Lamictal- Has been helpful for mood. Reports taking long term. Gabapentin- Takes for RLS with good response. Wellbutrin- Had convulsion when taking while drinking heavily.  Review of Systems:  Review of Systems   Musculoskeletal: Negative for gait problem.  Neurological: Negative for tremors.  Psychiatric/Behavioral:       Please refer to HPI    Medications: I have reviewed the patient's current medications.  Current Outpatient Medications  Medication Sig Dispense Refill  . busPIRone (BUSPAR) 15 MG tablet Take 1 tablet (15 mg total) by mouth 2 (two) times daily. 180 tablet 1  . CALCIUM PO Take by mouth.    . DULoxetine (CYMBALTA) 60 MG capsule Take 1 capsule (60 mg total) by mouth daily. 90 capsule 1  . estradiol (ESTRACE) 0.5 MG tablet Take 1 tablet (0.5 mg total) by mouth daily. 90 tablet 4  . gabapentin (NEURONTIN) 300 MG capsule Take 300 mg by mouth 3 (three) times daily.    Marland Kitchen lamoTRIgine (LAMICTAL) 100 MG tablet Take 1 tablet (100 mg total) by mouth daily. Take with a 200 mg tab to equal 300 mg total dose. 90 tablet 0  . lamoTRIgine (LAMICTAL) 200 MG tablet Take 1 tablet (200 mg total) by mouth daily. Take with a 100 mg tab to equal 300 mg total dose. 90 tablet 1  . progesterone (PROMETRIUM) 100 MG capsule Take 1 capsule (100 mg total) by mouth at bedtime. 90 capsule 4  . TURMERIC PO Take by mouth.     No current facility-administered medications for this visit.    Medication Side Effects: None  Allergies:  Allergies  Allergen Reactions  . Codeine Nausea And Vomiting    Past Medical History:  Diagnosis Date  .  Depression   . RLS (restless legs syndrome)     Family History  Problem Relation Age of Onset  . Cancer Mother        leukemia   . Hypertension Mother   . Cancer Father        lung   . Alcohol abuse Father   . Diabetes Sister   . Hyperlipidemia Sister   . Hypertension Sister   . Depression Brother   . Diabetes Brother   . Hypertension Brother   . Alcohol abuse Maternal Uncle   . Alcohol abuse Paternal Aunt   . Alcohol abuse Maternal Grandfather   . Alcohol abuse Brother     Social History   Socioeconomic History  . Marital status: Divorced    Spouse  name: Not on file  . Number of children: Not on file  . Years of education: Not on file  . Highest education level: Not on file  Occupational History  . Not on file  Tobacco Use  . Smoking status: Former Smoker    Quit date: 06/01/1996    Years since quitting: 22.9  . Smokeless tobacco: Never Used  Substance and Sexual Activity  . Alcohol use: Not Currently  . Drug use: Not Currently  . Sexual activity: Never    Comment: 1st intercourse- 39, partners- greater than 5,   Other Topics Concern  . Not on file  Social History Narrative  . Not on file   Social Determinants of Health   Financial Resource Strain:   . Difficulty of Paying Living Expenses: Not on file  Food Insecurity:   . Worried About Charity fundraiser in the Last Year: Not on file  . Ran Out of Food in the Last Year: Not on file  Transportation Needs:   . Lack of Transportation (Medical): Not on file  . Lack of Transportation (Non-Medical): Not on file  Physical Activity:   . Days of Exercise per Week: Not on file  . Minutes of Exercise per Session: Not on file  Stress:   . Feeling of Stress : Not on file  Social Connections:   . Frequency of Communication with Friends and Family: Not on file  . Frequency of Social Gatherings with Friends and Family: Not on file  . Attends Religious Services: Not on file  . Active Member of Clubs or Organizations: Not on file  . Attends Archivist Meetings: Not on file  . Marital Status: Not on file  Intimate Partner Violence:   . Fear of Current or Ex-Partner: Not on file  . Emotionally Abused: Not on file  . Physically Abused: Not on file  . Sexually Abused: Not on file    Past Medical History, Surgical history, Social history, and Family history were reviewed and updated as appropriate.   Please see review of systems for further details on the patient's review from today.   Objective:   Physical Exam:  There were no vitals taken for this  visit.  Physical Exam Constitutional:      General: She is not in acute distress. Musculoskeletal:        General: No deformity.  Neurological:     Mental Status: She is alert and oriented to person, place, and time.     Coordination: Coordination normal.  Psychiatric:        Attention and Perception: Attention and perception normal. She does not perceive auditory or visual hallucinations.        Mood and Affect:  Mood normal. Mood is not anxious or depressed. Affect is not labile, blunt, angry or inappropriate.        Speech: Speech normal.        Behavior: Behavior normal.        Thought Content: Thought content normal. Thought content is not paranoid or delusional. Thought content does not include homicidal or suicidal ideation. Thought content does not include homicidal or suicidal plan.        Cognition and Memory: Cognition and memory normal.        Judgment: Judgment normal.     Comments: Insight intact     Lab Review:     Component Value Date/Time   NA 140 10/27/2018 0256   K 3.7 10/27/2018 0256   CL 104 10/27/2018 0256   CO2 25 10/27/2018 0256   GLUCOSE 109 (H) 10/27/2018 0256   BUN 13 10/27/2018 0256   CREATININE 0.73 10/27/2018 0256   CREATININE 0.95 06/25/2018 0917   CALCIUM 9.3 10/27/2018 0256   PROT 6.8 10/27/2018 0256   ALBUMIN 4.2 10/27/2018 0256   AST 20 10/27/2018 0256   ALT 12 10/27/2018 0256   ALKPHOS 55 10/27/2018 0256   BILITOT 0.7 10/27/2018 0256   GFRNONAA >60 10/27/2018 0256   GFRAA >60 10/27/2018 0256       Component Value Date/Time   WBC 10.9 (H) 10/27/2018 0256   RBC 4.64 10/27/2018 0256   HGB 13.9 10/27/2018 0256   HCT 41.7 10/27/2018 0256   PLT 349 10/27/2018 0256   MCV 89.9 10/27/2018 0256   MCV 88.8 05/27/2013 1250   MCH 30.0 10/27/2018 0256   MCHC 33.3 10/27/2018 0256   RDW 12.6 10/27/2018 0256   LYMPHSABS 1.5 10/27/2018 0256   MONOABS 0.5 10/27/2018 0256   EOSABS 0.1 10/27/2018 0256   BASOSABS 0.0 10/27/2018 0256    No  results found for: POCLITH, LITHIUM   No results found for: PHENYTOIN, PHENOBARB, VALPROATE, CBMZ   .res Assessment: Plan:   Will continue Lamictal 300 mg daily since patient reports improved anxiety and mood signs and symptoms at this dose.  Advised patient to monitor spending and to discuss therapist, since patient is not reporting any other possible signs and symptoms of hypomania. Will continue Cymbalta 60 mg daily for anxiety depression. Will extend follow-up of visits at pt's request due to loss of insurance.  Patient to follow-up in 6 months or sooner if clinically indicated. Patient advised to contact office with any questions, adverse effects, or acute worsening in signs and symptoms. Recommend continuing to therapy with Rinaldo Cloud, LCSW.  Roze was seen today for follow-up.  Diagnoses and all orders for this visit:  Generalized anxiety disorder -     busPIRone (BUSPAR) 15 MG tablet; Take 1 tablet (15 mg total) by mouth 2 (two) times daily. -     lamoTRIgine (LAMICTAL) 100 MG tablet; Take 1 tablet (100 mg total) by mouth daily. Take with a 200 mg tab to equal 300 mg total dose. -     lamoTRIgine (LAMICTAL) 200 MG tablet; Take 1 tablet (200 mg total) by mouth daily. Take with a 100 mg tab to equal 300 mg total dose. -     DULoxetine (CYMBALTA) 60 MG capsule; Take 1 capsule (60 mg total) by mouth daily.  Depression, unspecified depression type -     lamoTRIgine (LAMICTAL) 100 MG tablet; Take 1 tablet (100 mg total) by mouth daily. Take with a 200 mg tab to equal 300 mg total dose. -  lamoTRIgine (LAMICTAL) 200 MG tablet; Take 1 tablet (200 mg total) by mouth daily. Take with a 100 mg tab to equal 300 mg total dose. -     DULoxetine (CYMBALTA) 60 MG capsule; Take 1 capsule (60 mg total) by mouth daily.     Please see After Visit Summary for patient specific instructions.  Future Appointments  Date Time Provider [place]  11/26/2019  9:00 AM [name], PMHNP  CP-CP None    No orders of the defined types were placed in this encounter.   -------------------------------

## 2019-06-25 ENCOUNTER — Other Ambulatory Visit: Payer: Self-pay | Admitting: Obstetrics & Gynecology

## 2019-06-26 ENCOUNTER — Telehealth: Payer: Self-pay | Admitting: *Deleted

## 2019-06-26 MED ORDER — PROGESTERONE MICRONIZED 100 MG PO CAPS: 100.0000 mg | ORAL_CAPSULE | Freq: Every day | ORAL | 0 refills | Status: DC

## 2019-06-26 MED ORDER — ESTRADIOL 0.5 MG PO TABS: 0.5000 mg | ORAL_TABLET | Freq: Every day | ORAL | 0 refills | Status: DC

## 2019-06-26 NOTE — Telephone Encounter (Signed)
Patient currently without insurance at the time, requesting refill on estradiol 0.5 mg tablet and progesterone 100 mg cap. Patient said she is not able to pay for visit out of pocket at this time, asked if a 3 month supply of HRT can be sent to pharmacy? Patient said she should have insurance by then. Please advise

## 2019-06-26 NOTE — Telephone Encounter (Signed)
Agree to represcribe same HRT for maximum 3 months as long as she has had no change in her health and medical diagnoses.

## 2019-06-26 NOTE — Telephone Encounter (Signed)
Patient said no to any health changes and or new medical diagnoses. Rx's sent

## 2019-07-24 ENCOUNTER — Other Ambulatory Visit: Payer: Self-pay | Admitting: Psychiatry

## 2019-07-24 DIAGNOSIS — F411 Generalized anxiety disorder: Secondary | ICD-10-CM

## 2019-07-24 DIAGNOSIS — F329 Major depressive disorder, single episode, unspecified: Secondary | ICD-10-CM

## 2019-07-24 DIAGNOSIS — F32A Depression, unspecified: Secondary | ICD-10-CM

## 2019-09-19 ENCOUNTER — Other Ambulatory Visit: Payer: Self-pay

## 2019-09-19 ENCOUNTER — Ambulatory Visit (INDEPENDENT_AMBULATORY_CARE_PROVIDER_SITE_OTHER): Payer: 59 | Admitting: Psychiatry

## 2019-09-19 DIAGNOSIS — F411 Generalized anxiety disorder: Secondary | ICD-10-CM

## 2019-09-19 NOTE — Progress Notes (Signed)
[facility] Counselor/Therapist Progress Note  Patient ID: Kara Webster, MRN: [id number],    Date: 09/19/2019  Time Spent: 60 minutes   8:48am to 9:48am   Treatment Type: Individual Therapy  Reported Symptoms: anxiety, depression, anger "blows up easily", some difficulty focusing  Mental Status Exam:  Appearance:   Neat     Behavior:  Appropriate, Sharing and Motivated  Motor:  Normal  Speech/Language:   Clear and Coherent  Affect:  anxiety, depression (depression is the strongest)  Mood:  anxious, depressed and some angry outfursts  Thought process:  goal directed  Thought content:    WNL  Sensory/Perceptual disturbances:    WNL  Orientation:  oriented to person, place, time/date, situation, day of week, month of year and year  Attention:  Poor  Concentration:  Poor  Memory:  forgetfulness  Fund of knowledge:   Good  Insight:    Good and Fair  Judgment:   Fair and Poor  Impulse Control:  Fair and Poor   Risk Assessment: Danger to Self:  No Self-injurious Behavior: No Danger to Others: No Duty to Warn:no Physical Aggression / Violence:No  Access to Firearms a concern: No  Gang Involvement:No   Subjective: Patient today reports anxiety and depression, and some prior SI but denies any currently.  Gets confused and has trouble remembering.  "Blows up easily" per her report.  Interventions: Cognitive Behavioral Therapy and Solution-Oriented/Positive Psychology  Diagnosis:   ICD-10-CM   1. Generalized anxiety disorder  F41.1     Plan of Care: Patient not signing tx plan on computer screen due to [place].  Treatment Goals: Goals remain on tx plan as patient works on strategies to meet her goals.Progress will be documented each session in "Progress" section of Plan.  Long term goal: Reduce overall level, frequency, and intensity of the anxiety so that daily functioning is not impaired.  Short term goal: Increase understanding of beliefs and messages that  produce worry and anxiety, and be able to develop more adaptive and positive beliefs and messages.  Strategies: 1. Explore cognitive messages that lead to anxiety and learn more adaptive cognitive patterns that do not support anxiety, and do support calmness, rational thinking, and self-confidence. 2. Identify, challenge, and replace fearful, indecisive, and anxiety-provoking self-talk with positive, reality-based, and empowering self-talk.  PROGRESS: Patient in today after not being seen since February 2021. Reports anxiety, "blowups and outbursts", depression, some difficulty focusing, and some prior SI earlier in the week but denies any current SI / HI thoughts or plans.  Gives a firm commitment not to harm self and a promise to contact our office or hospital ED if care is needed before next appt. Is working part time helping out in another dentist's office and that seems to be going ok for now.  Her anxiety,depression, and anger, she reports are related mostly to family and friend situations (some in the past, but more are related to current/ongoing situations). Worked in session on depression and anger,  Managing anger and impulsive tendencies, and allowing her to process a lot of feelings she has had regarding herself and others, and explaining how "I've not done a good job in handling things with others. Occasional outbursts when angry or upset and "then I get over it, until the next time."  Did seem to be calmer and more grounded at session end and seemed to retain the work we did today, and hoping to be able to apply strategies that will lead to  better results in interactions with others.  Again denied any SI and I reminded her of how to access emergent care if needed before next appt.  She made firm commitment again not to harm self and is to be working to change the way she manages anxiety, anger, depression, and stress based on our talking today.  Asked her about checking in with her med  provider and she is to follow up with that. Encouraged her to practice good self-care (emotinally and physically) and to allow her partner to be of support rather than pushing people away.  Goal review and progress/challenges/efforts noted with patient.  Next appt within 2 weeks.   Shanon Ace, LCSW

## 2019-09-25 ENCOUNTER — Other Ambulatory Visit: Payer: Self-pay | Admitting: Obstetrics & Gynecology

## 2019-09-26 ENCOUNTER — Telehealth: Payer: Self-pay | Admitting: *Deleted

## 2019-09-26 MED ORDER — PROGESTERONE MICRONIZED 100 MG PO CAPS: 100.0000 mg | ORAL_CAPSULE | Freq: Every day | ORAL | 0 refills | Status: DC

## 2019-09-26 NOTE — Telephone Encounter (Signed)
Patient has annual exam scheduled on 10/23/19, needs refill on progesterone 100 mg capsule. Rx sent.

## 2019-10-01 ENCOUNTER — Other Ambulatory Visit: Payer: Self-pay

## 2019-10-01 ENCOUNTER — Ambulatory Visit (INDEPENDENT_AMBULATORY_CARE_PROVIDER_SITE_OTHER): Payer: 59 | Admitting: Psychiatry

## 2019-10-01 DIAGNOSIS — F411 Generalized anxiety disorder: Secondary | ICD-10-CM | POA: Diagnosis not present

## 2019-10-01 NOTE — Progress Notes (Signed)
      [facility] Counselor/Therapist Progress Note  Patient ID: Kara Webster, MRN: [id number],    Date: 10/01/2019  Time Spent: 60 minutes  12:00noon to 1:00pm  Treatment Type: Individual Therapy  Reported Symptoms: anxiety, depression, "anxiety some stronger"  Mental Status Exam:  Appearance:   Casual     Behavior:  Appropriate and Sharing  Motor:  Normal  Speech/Language:   Normal Rate  Affect:  anxious, depressed  Mood:  anxious, depressed  Thought process:  normal  Thought content:    Some obessiveness and blaming self  Sensory/Perceptual disturbances:    WNL  Orientation:  oriented to person, place, time/date, situation, day of week, month of year and year  Attention:  Good  Concentration:  Fair  Memory:  difficulty remembering  Fund of knowledge:   Good  Insight:    Fair  Judgment:   Good and Fair  Impulse Control:  Good and Fair   Risk Assessment: Danger to Self:  No Self-injurious Behavior: No Danger to Others: No Duty to Warn:no Physical Aggression / Violence:No  Access to Firearms a concern: No  Gang Involvement:No   Subjective: Patient today reporting anxiety and depression, with the anxiety being the stronger symptom.  Denies SI since last appt.   Interventions: Solution-Oriented/Positive Psychology and Ego-Supportive  Diagnosis:   ICD-10-CM   1. Generalized anxiety disorder  F41.1     Plan of Care: Patient not signing tx plan on computer screen due to [place].  Treatment Goals: Goals remain on tx plan as patient works on strategies to meet her goals.Progress will be documented each session in "Progress" section of Plan.  Long term goal: Reduce overall level, frequency, and intensity of the anxiety so that daily functioning is not impaired.  Short term goal: Increase understanding of beliefs and messages that produce worry and anxiety, and be able to develop more adaptive and positive beliefs and messages.  Strategies: 1. Explore  cognitive messages that lead to anxiety and learn more adaptive cognitive patterns that do not support anxiety, and do support calmness, rational thinking, and self-confidence. 2. Identify, challenge, and replace fearful, indecisive, and anxiety-provoking self-talk with positive, reality-based, and empowering self-talk.  PROGRESS: Patient in today reporting anxiety and depression, with anxiety being stronger. Was some calmer than last session. Tearfulness, "Very unhappy." Denies current SI and agrees to be in contact with our office if those thoughts return.  Low self-esteem, doesn't like her self and feel she has bad things in her past that she still dwells on, "I hold a lot of things in my heal.". "Cant forget nor let go of past." States she has not and will not discuss those things with anyone but "basically I just dont like myself." Worked hard today on her negative thoughts, self-negating and her anxiety. And as noted in goal plan above, did participate in identifying and challenging, (but not yet replacing)  her negative/anxious/depressive thoughts and self-talk. Plan to follow up on this next session. Did encourage positive self-care and the allowing of a couple people that are in close contact with her to be of support.  Goal review and progress/challenges noted with patient.  Next appt within 2 weeks.  Patient is to call back when she gets her work schedule.   Shanon Ace, LCSW

## 2019-10-15 ENCOUNTER — Other Ambulatory Visit: Payer: Self-pay

## 2019-10-15 ENCOUNTER — Ambulatory Visit (INDEPENDENT_AMBULATORY_CARE_PROVIDER_SITE_OTHER): Payer: 59 | Admitting: Psychiatry

## 2019-10-15 DIAGNOSIS — F329 Major depressive disorder, single episode, unspecified: Secondary | ICD-10-CM | POA: Diagnosis not present

## 2019-10-15 DIAGNOSIS — F32A Depression, unspecified: Secondary | ICD-10-CM

## 2019-10-15 NOTE — Progress Notes (Signed)
[facility] Counselor/Therapist Progress Note  Patient ID: Kara Webster, MRN: [id number],    Date: 10/15/2019  Time Spent: 60 minutes   9:00am to 10:00am   Treatment Type: Individual Therapy  Reported Symptoms: anxiety, depression (increased some)  Mental Status Exam:  Appearance:   Casual     Behavior:  Appropriate and Sharing  Motor:  Normal  Speech/Language:   Clear and Coherent  Affect:  anxious  Mood:  anxious and depressed  Thought process:  normal  Thought content:    WNL; some rumination  Sensory/Perceptual disturbances:    WNL  Orientation:  oriented to person, place, time/date, situation, day of week, month of year and year  Attention:  Fair  Concentration:  Fair  Memory:  forgetfulness "but it doesn't seem as bad lately"  Fund of knowledge:   Good  Insight:    Fair  Judgment:   Good and Fair  Impulse Control:  Good and Fair   Risk Assessment: Danger to Self:  No.  Later in session stated she does have SI frequently but "I would not ever do that because of the people in my life that love and care about me."  Self-injurious Behavior: No Danger to Others: No Duty to Warn:no Physical Aggression / Violence:No  Access to Firearms a concern: No  Gang Involvement:No   Subjective: Patient today reporting anxiety and increased depression, "although today is a better day." Working part-time.   Interventions: Cognitive Behavioral Therapy and Solution-Oriented/Positive Psychology  Diagnosis:   ICD-10-CM   1. Depression, unspecified depression type  F32.9      Plan of Care: Patient not signing tx plan on computer screen due to [place].  Treatment Goals: Goals remain on tx plan as patient works on strategies to meet her goals.Progress will be documented each session in "Progress" section of Plan.  Long term goal: Reduce overall level, frequency, and intensity of the anxiety so that daily functioning is not impaired.  Short term goal: Increase  understanding of beliefs and messages that produce worry and anxiety, and be able to develop more adaptive and positive beliefs and messages.  Strategies: 1. Explore cognitive messages that lead to anxiety and learn more adaptive cognitive patterns that do not support anxiety, and do support calmness, rational thinking, and self-confidence. 2. Identify, challenge, and replace fearful, indecisive, and anxiety-provoking self-talk with positive, reality-based, and empowering self-talk.  PROGRESS: Patient in today reporting anxiety and increased depression. "But today is a better day in some ways, but I don't know why.".  Stated she hasn't een having any SI.  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  Later also admits that she" has SI but feels it will never happen because of the people that care about me in my life." Explained to her how to access services after hours if she is ever having SI and feels she is losing control and would harm self."  Also is to get a sooner appt with her med provider  here, [name]. Encouraged more positive self-care that we reviewed today and have discussed earlier, and also encourage her to  allowi a couple of people closer to her to be of support rather than spending as much time alone. Continues to deny any SI.  Goal review and progress noted with patient.  Next appt within 2-3 weeks.   Shanon Ace, LCSW

## 2019-10-23 ENCOUNTER — Ambulatory Visit (INDEPENDENT_AMBULATORY_CARE_PROVIDER_SITE_OTHER): Payer: 59 | Admitting: Obstetrics & Gynecology

## 2019-10-23 ENCOUNTER — Encounter: Payer: Self-pay | Admitting: Obstetrics & Gynecology

## 2019-10-23 ENCOUNTER — Other Ambulatory Visit: Payer: Self-pay

## 2019-10-23 VITALS — BP 120/82 | Ht 63.0 in | Wt 128.8 lb

## 2019-10-23 DIAGNOSIS — N951 Menopausal and female climacteric states: Secondary | ICD-10-CM | POA: Diagnosis not present

## 2019-10-23 DIAGNOSIS — Z13820 Encounter for screening for osteoporosis: Secondary | ICD-10-CM | POA: Diagnosis not present

## 2019-10-23 DIAGNOSIS — Z79890 Hormone replacement therapy (postmenopausal): Secondary | ICD-10-CM | POA: Diagnosis not present

## 2019-10-23 DIAGNOSIS — Z01419 Encounter for gynecological examination (general) (routine) without abnormal findings: Secondary | ICD-10-CM

## 2019-10-23 MED ORDER — PROGESTERONE MICRONIZED 100 MG PO CAPS: 100.0000 mg | ORAL_CAPSULE | Freq: Every day | ORAL | 4 refills | Status: DC

## 2019-10-23 MED ORDER — ESTRADIOL 0.5 MG PO TABS: 0.2500 mg | ORAL_TABLET | Freq: Every day | ORAL | 4 refills | Status: DC

## 2019-10-23 NOTE — Progress Notes (Signed)
Kara Webster Oct 30, 1955 [id number]   History:    64 y.o. G2P2L2 Same sex partner x 14 yrs.  RP:  Established patient presenting for annual gyn exam   HPI: Menopause, well on HRT with Estradiol 0.5 mg tab daily and Prometrium 100 mg HS.  No PMB.  No pelvic pain.  Same sex partner.  Breasts normal.  BMI normal at 22.8.  Walks her dogs. Healthy nutrition.  F/U for Fasting health labs here.  Past medical history,surgical history, family history and social history were all reviewed and documented in the EPIC chart.  Gynecologic History No LMP recorded. Patient is postmenopausal.  Obstetric History OB History  Gravida Para Term Preterm AB Living  _0 SAB TAB Ectopic Multiple Live Births               # Outcome Date GA Lbr Len/2nd Weight Sex Delivery Anes PTL Lv  2 Para           1 Para              ROS: A ROS was performed and pertinent positives and negatives are included in the history.  GENERAL: No fevers or chills. HEENT: No change in vision, no earache, sore throat or sinus congestion. NECK: No pain or stiffness. CARDIOVASCULAR: No chest pain or pressure. No palpitations. PULMONARY: No shortness of breath, cough or wheeze. GASTROINTESTINAL: No abdominal pain, nausea, vomiting or diarrhea, melena or bright red blood per rectum. GENITOURINARY: No urinary frequency, urgency, hesitancy or dysuria. MUSCULOSKELETAL: No joint or muscle pain, no back pain, no recent trauma. DERMATOLOGIC: No rash, no itching, no lesions. ENDOCRINE: No polyuria, polydipsia, no heat or cold intolerance. No recent change in weight. HEMATOLOGICAL: No anemia or easy bruising or bleeding. NEUROLOGIC: No headache, seizures, numbness, tingling or weakness. PSYCHIATRIC: No depression, no loss of interest in normal activity or change in sleep pattern.     Exam:   BP 120/82   Ht _1  (1.6 m)   Wt 128 lb 12.8 oz (58.4 kg)   BMI 22.82 kg/m   Body mass index is 22.82 kg/m.  General appearance :  Well developed well nourished female. No acute distress HEENT: Eyes: no retinal hemorrhage or exudates,  Neck supple, trachea midline, no carotid bruits, no thyroidmegaly Lungs: Clear to auscultation, no rhonchi or wheezes, or rib retractions  Heart: Regular rate and rhythm, no murmurs or gallops Breast:Examined in sitting and supine position were symmetrical in appearance, no palpable masses or tenderness,  no skin retraction, no nipple inversion, no nipple discharge, no skin discoloration, no axillary or supraclavicular lymphadenopathy Abdomen: no palpable masses or tenderness, no rebound or guarding Extremities: no edema or skin discoloration or tenderness  Pelvic: Vulva: Normal             Vagina: No gross lesions or discharge  Cervix: No gross lesions or discharge  Uterus  AV, normal size, shape and consistency, non-tender and mobile  Adnexa  Without masses or tenderness  Anus: Normal   Assessment/Plan:  64 y.o. female for annual exam   1. Well female exam with routine gynecological exam Normal gynecologic exam.  No indication to repeat a Pap test this year.  Breast exam normal.  Will schedule a screening mammogram at the breast center now.  Fasting health labs here.  Colonoscopy 9 years ago.  Body mass index 22.82.  Continue with healthy nutrition and fitness. - CBC; Future - Comp  Met (CMET); Future - Lipid panel; Future - TSH; Future - VITAMIN D 25 Hydroxy (Vit-D Deficiency, Fractures); Future  2. Menopausal syndrome on hormone replacement therapy Well on hormone replacement therapy, probably for more than 10 years on it.  Will start weaning.  Will take estradiol 0.5/tab half a tablet per mouth daily and progesterone 100 mg/caps 1 capsule per mouth at bedtime.  Prescription sent to pharmacy.  3. Screening for osteoporosis Schedule bone density here now.  Vitamin D supplements, calcium intake of 1200 to 1500 mg daily and regular weightbearing physical activities. - DG Bone  Density; Future  Other orders - progesterone (PROMETRIUM) 100 MG capsule; Take 1 capsule (100 mg total) by mouth at bedtime. - estradiol (ESTRACE) 0.5 MG tablet; Take 0.5 tablets (0.25 mg total) by mouth daily.  Princess Bruins MD, 4:21 PM 10/23/2019

## 2019-10-24 ENCOUNTER — Other Ambulatory Visit: Payer: Self-pay | Admitting: Obstetrics & Gynecology

## 2019-10-24 DIAGNOSIS — Z1231 Encounter for screening mammogram for malignant neoplasm of breast: Secondary | ICD-10-CM

## 2019-10-27 ENCOUNTER — Other Ambulatory Visit: Payer: Self-pay | Admitting: Anesthesiology

## 2019-11-05 ENCOUNTER — Ambulatory Visit: Payer: 59 | Admitting: Psychiatry

## 2019-11-05 ENCOUNTER — Ambulatory Visit (INDEPENDENT_AMBULATORY_CARE_PROVIDER_SITE_OTHER): Payer: 59 | Admitting: Psychiatry

## 2019-11-05 ENCOUNTER — Encounter: Payer: Self-pay | Admitting: Psychiatry

## 2019-11-05 ENCOUNTER — Other Ambulatory Visit: Payer: Self-pay

## 2019-11-05 DIAGNOSIS — F411 Generalized anxiety disorder: Secondary | ICD-10-CM | POA: Diagnosis not present

## 2019-11-05 DIAGNOSIS — F329 Major depressive disorder, single episode, unspecified: Secondary | ICD-10-CM

## 2019-11-05 DIAGNOSIS — F32A Depression, unspecified: Secondary | ICD-10-CM

## 2019-11-05 MED ORDER — BUSPIRONE HCL 15 MG PO TABS: 15.0000 mg | ORAL_TABLET | Freq: Two times a day (BID) | ORAL | 1 refills | Status: DC

## 2019-11-05 MED ORDER — LAMOTRIGINE 200 MG PO TABS: 200.0000 mg | ORAL_TABLET | Freq: Every day | ORAL | 1 refills | Status: DC

## 2019-11-05 MED ORDER — LAMOTRIGINE 100 MG PO TABS: 100.0000 mg | ORAL_TABLET | Freq: Every day | ORAL | 0 refills | Status: DC

## 2019-11-05 MED ORDER — LITHIUM CARBONATE 150 MG PO CAPS: ORAL_CAPSULE | ORAL | 1 refills | Status: DC

## 2019-11-05 MED ORDER — DULOXETINE HCL 60 MG PO CPEP: 60.0000 mg | ORAL_CAPSULE | Freq: Every day | ORAL | 1 refills | Status: DC

## 2019-11-05 NOTE — Progress Notes (Signed)
Kara Webster [id number] 13-Dec-1955 64 y.o.  Subjective:   Patient ID:  Kara Webster is a 64 y.o. (DOB 11/18/1955) female.  Chief Complaint:  Chief Complaint  Patient presents with  . Depression  . Anxiety    HPI Kara TREMBLAY presents to the office today for follow-up of mood disturbance and anxiety. She reports that she has been having increased depression. Noticed worsening depression since early July. Reports that she had periods of increased irritability prior to onset of depression. She reports that she had angry outbursts with several loved ones for several days. She reports that she had some impulsive and risky behavior. Energy and motivation have been lower. Sleep has been ok. Appetite has been decreased and reports losing some weight unintentionally. Reports that she does not like to eat alone and does not like to cook or mess up her kitchen. She reports that her anxiety has been high at times.She reports that she likes things a certain way and cleans frequently despite being the only person that lives there. She reports that she is very focused on her house. She reports that she has "destroyed my relationships." She reports that she has been spending excessively although she is concerned about her finances without having a steady job currently. Reports some impulsive and risky behavior. Concentration has been ok at work and has had other periods of disorientation and difficulty with concentration. Reports that she has had SI without intent- "I won't do it" and has called suicide hotline when she has had SI. Contracts for safety.   Reports relationship with her daughter is now strained. Has been able to continue to see grandchildren.   Has been working some recently filling in for staff.   Past Psychiatric Medication Trials: Abilify- Caused tremors. Has not noticed any worsening s/s since stopping it.  Cymbalta- Thinks this has been helpful for her depression. Has had severe  discontinuation s/s with missed doses to the point of going to ER.  Lamictal- Has been helpful for mood. Reports taking long term. Gabapentin- Takes for RLS with good response. Wellbutrin- Had convulsion when taking while drinking heavily.     Review of Systems:  Review of Systems  Musculoskeletal: Positive for arthralgias. Negative for gait problem.  Neurological: Negative for tremors.  Psychiatric/Behavioral:       Please refer to HPI    Medications: I have reviewed the patient's current medications.  Current Outpatient Medications  Medication Sig Dispense Refill  . CALCIUM PO Take by mouth.    . estradiol (ESTRACE) 0.5 MG tablet Take 0.5 tablets (0.25 mg total) by mouth daily. 45 tablet 4  . gabapentin (NEURONTIN) 300 MG capsule Take 300 mg by mouth 3 (three) times daily.    Marland Kitchen lamoTRIgine (LAMICTAL) 100 MG tablet Take 1 tablet (100 mg total) by mouth daily. 90 tablet 0  . lamoTRIgine (LAMICTAL) 200 MG tablet Take 1 tablet (200 mg total) by mouth daily. Take with a 100 mg tab to equal 300 mg total dose. 90 tablet 1  . progesterone (PROMETRIUM) 100 MG capsule Take 1 capsule (100 mg total) by mouth at bedtime. 90 capsule 4  . TURMERIC PO Take by mouth.    . busPIRone (BUSPAR) 15 MG tablet Take 1 tablet (15 mg total) by mouth 2 (two) times daily. 180 tablet 1  . DULoxetine (CYMBALTA) 60 MG capsule Take 1 capsule (60 mg total) by mouth daily. 90 capsule 1  . lithium carbonate 150 MG capsule Take 1 capsule po  QHS x 3-5 days, then increase to 2 capsules po QHS 60 capsule 1   No current facility-administered medications for this visit.    Medication Side Effects: None  Allergies:  Allergies  Allergen Reactions  . Codeine Nausea And Vomiting    Past Medical History:  Diagnosis Date  . Depression   . RLS (restless legs syndrome)     Family History  Problem Relation Age of Onset  . Cancer Mother        leukemia   . Hypertension Mother   . Cancer Father        lung   .  Alcohol abuse Father   . Diabetes Sister   . Hyperlipidemia Sister   . Hypertension Sister   . Depression Brother   . Diabetes Brother   . Hypertension Brother   . Alcohol abuse Maternal Uncle   . Alcohol abuse Paternal Aunt   . Alcohol abuse Maternal Grandfather   . Alcohol abuse Brother     Social History   Socioeconomic History  . Marital status: Divorced    Spouse name: Not on file  . Number of children: Not on file  . Years of education: Not on file  . Highest education level: Not on file  Occupational History  . Not on file  Tobacco Use  . Smoking status: Former Smoker    Quit date: 06/01/1996    Years since quitting: 23.4  . Smokeless tobacco: Never Used  Vaping Use  . Vaping Use: Never used  Substance and Sexual Activity  . Alcohol use: Not Currently  . Drug use: Not Currently  . Sexual activity: Never    Comment: 1st intercourse- 72, partners- greater than 5,   Other Topics Concern  . Not on file  Social History Narrative  . Not on file   Social Determinants of Health   Financial Resource Strain:   . Difficulty of Paying Living Expenses:   Food Insecurity:   . Worried About Charity fundraiser in the Last Year:   . Arboriculturist in the Last Year:   Transportation Needs:   . Film/video editor (Medical):   Marland Kitchen Lack of Transportation (Non-Medical):   Physical Activity:   . Days of Exercise per Week:   . Minutes of Exercise per Session:   Stress:   . Feeling of Stress :   Social Connections:   . Frequency of Communication with Friends and Family:   . Frequency of Social Gatherings with Friends and Family:   . Attends Religious Services:   . Active Member of Clubs or Organizations:   . Attends Archivist Meetings:   Marland Kitchen Marital Status:   Intimate Partner Violence:   . Fear of Current or Ex-Partner:   . Emotionally Abused:   Marland Kitchen Physically Abused:   . Sexually Abused:     Past Medical History, Surgical history, Social history, and  Family history were reviewed and updated as appropriate.   Please see review of systems for further details on the patient's review from today.   Objective:   Physical Exam:  There were no vitals taken for this visit.  Physical Exam Constitutional:      General: She is not in acute distress. Musculoskeletal:        General: No deformity.  Neurological:     Mental Status: She is alert and oriented to person, place, and time.     Coordination: Coordination normal.  Psychiatric:  Attention and Perception: Attention and perception normal. She does not perceive auditory or visual hallucinations.        Mood and Affect: Mood is anxious and depressed. Affect is not labile, blunt, angry or inappropriate.        Speech: Speech normal.        Behavior: Behavior normal.        Thought Content: Thought content normal. Thought content is not paranoid or delusional. Thought content does not include homicidal or suicidal ideation. Thought content does not include homicidal or suicidal plan.        Cognition and Memory: Cognition and memory normal.        Judgment: Judgment normal.     Comments: Insight intact     Lab Review:     Component Value Date/Time   NA 140 10/27/2018 0256   K 3.7 10/27/2018 0256   CL 104 10/27/2018 0256   CO2 25 10/27/2018 0256   GLUCOSE 109 (H) 10/27/2018 0256   BUN 13 10/27/2018 0256   CREATININE 0.73 10/27/2018 0256   CREATININE 0.95 06/25/2018 0917   CALCIUM 9.3 10/27/2018 0256   PROT 6.8 10/27/2018 0256   ALBUMIN 4.2 10/27/2018 0256   AST 20 10/27/2018 0256   ALT 12 10/27/2018 0256   ALKPHOS 55 10/27/2018 0256   BILITOT 0.7 10/27/2018 0256   GFRNONAA >60 10/27/2018 0256   GFRAA >60 10/27/2018 0256       Component Value Date/Time   WBC 10.9 (H) 10/27/2018 0256   RBC 4.64 10/27/2018 0256   HGB 13.9 10/27/2018 0256   HCT 41.7 10/27/2018 0256   PLT 349 10/27/2018 0256   MCV 89.9 10/27/2018 0256   MCV 88.8 05/27/2013 1250   MCH 30.0  10/27/2018 0256   MCHC 33.3 10/27/2018 0256   RDW 12.6 10/27/2018 0256   LYMPHSABS 1.5 10/27/2018 0256   MONOABS 0.5 10/27/2018 0256   EOSABS 0.1 10/27/2018 0256   BASOSABS 0.0 10/27/2018 0256    No results found for: POCLITH, LITHIUM   No results found for: PHENYTOIN, PHENOBARB, VALPROATE, CBMZ   .res Assessment: Plan:   Discussed possible resources that pt can use for additional support such as [facility] while pt is without insurance and reports that she can not afford therapy visits as often as she would like.  Discussed potential benefits, risks, and side effects of Lithium. Discussed that Lithium can help improve mood and prevent suicidal thoughts. Pt agrees to trial of Lithium. Will start Lithium 150 mg po QHS for 3-5 nights, then increase to 300 mg po QHS for mood stabilization.  Continue Lamictal 300 mg po qd for mood stabilization.  Continue Cymbalta 60 mg po qd for depression.  Pt to f/u with this provider in 2 months or sooner if clinically indicated.  Patient advised to contact office with any questions, adverse effects, or acute worsening in signs and symptoms.  Ashantia was seen today for depression and anxiety.  Diagnoses and all orders for this visit:  Generalized anxiety disorder -     busPIRone (BUSPAR) 15 MG tablet; Take 1 tablet (15 mg total) by mouth 2 (two) times daily. -     DULoxetine (CYMBALTA) 60 MG capsule; Take 1 capsule (60 mg total) by mouth daily. -     lamoTRIgine (LAMICTAL) 200 MG tablet; Take 1 tablet (200 mg total) by mouth daily. Take with a 100 mg tab to equal 300 mg total dose. -     lamoTRIgine (LAMICTAL) 100 MG tablet;  Take 1 tablet (100 mg total) by mouth daily.  Depression, unspecified depression type -     DULoxetine (CYMBALTA) 60 MG capsule; Take 1 capsule (60 mg total) by mouth daily. -     lamoTRIgine (LAMICTAL) 200 MG tablet; Take 1 tablet (200 mg total) by mouth daily. Take with a 100 mg tab to equal 300 mg total dose. -      lamoTRIgine (LAMICTAL) 100 MG tablet; Take 1 tablet (100 mg total) by mouth daily. -     lithium carbonate 150 MG capsule; Take 1 capsule po QHS x 3-5 days, then increase to 2 capsules po QHS     Please see After Visit Summary for patient specific instructions.  Future Appointments  Date Time Provider [place]  11/07/2019  1:40 PM [facility] MM 2 [facility]  11/11/2019 12:00 PM [facility] LAB ROOM [facility]  11/11/2019 12:20 PM [facility] BONE DENSITY RM [facility] None  01/09/2020  8:30 AM [name], PMHNP [facility] None  10/25/2020  2:00 PM Princess Bruins, MD [facility]    No orders of the defined types were placed in this encounter.   -------------------------------

## 2019-11-07 ENCOUNTER — Other Ambulatory Visit: Payer: Self-pay | Admitting: Obstetrics & Gynecology

## 2019-11-07 ENCOUNTER — Ambulatory Visit
Admission: RE | Admit: 2019-11-07 | Discharge: 2019-11-07 | Disposition: A | Discharge status: Home / Self Care | Payer: 59 | Source: Ambulatory Visit | Attending: Obstetrics & Gynecology | Admitting: Obstetrics & Gynecology

## 2019-11-07 ENCOUNTER — Other Ambulatory Visit: Payer: Self-pay

## 2019-11-07 DIAGNOSIS — Z1231 Encounter for screening mammogram for malignant neoplasm of breast: Secondary | ICD-10-CM

## 2019-11-11 ENCOUNTER — Other Ambulatory Visit: Payer: 59

## 2019-11-11 ENCOUNTER — Ambulatory Visit (INDEPENDENT_AMBULATORY_CARE_PROVIDER_SITE_OTHER): Payer: 59

## 2019-11-11 ENCOUNTER — Other Ambulatory Visit: Payer: Self-pay | Admitting: Obstetrics & Gynecology

## 2019-11-11 ENCOUNTER — Other Ambulatory Visit: Payer: Self-pay

## 2019-11-11 DIAGNOSIS — M8589 Other specified disorders of bone density and structure, multiple sites: Secondary | ICD-10-CM

## 2019-11-11 DIAGNOSIS — Z780 Asymptomatic menopausal state: Secondary | ICD-10-CM

## 2019-11-11 DIAGNOSIS — Z13820 Encounter for screening for osteoporosis: Secondary | ICD-10-CM

## 2019-11-11 DIAGNOSIS — Z01419 Encounter for gynecological examination (general) (routine) without abnormal findings: Secondary | ICD-10-CM

## 2019-11-12 LAB — COMPREHENSIVE METABOLIC PANEL
AG Ratio: 2 (calc) (ref 1.0–2.5)
ALT: 7 U/L (ref 6–29)
AST: 20 U/L (ref 10–35)
Albumin: 4.6 g/dL (ref 3.6–5.1)
Alkaline phosphatase (APISO): 60 U/L (ref 37–153)
BUN: 7 mg/dL (ref 7–25)
CO2: 27 mmol/L (ref 20–32)
Calcium: 9.7 mg/dL (ref 8.6–10.4)
Chloride: 105 mmol/L (ref 98–110)
Creat: 0.79 mg/dL (ref 0.50–0.99)
Globulin: 2.3 g/dL (calc) (ref 1.9–3.7)
Glucose, Bld: 83 mg/dL (ref 65–99)
Potassium: 5.3 mmol/L (ref 3.5–5.3)
Sodium: 140 mmol/L (ref 135–146)
Total Bilirubin: 0.7 mg/dL (ref 0.2–1.2)
Total Protein: 6.9 g/dL (ref 6.1–8.1)

## 2019-11-12 LAB — CBC
HCT: 42.4 % (ref 35.0–45.0)
Hemoglobin: 14.3 g/dL (ref 11.7–15.5)
MCH: 30 pg (ref 27.0–33.0)
MCHC: 33.7 g/dL (ref 32.0–36.0)
MCV: 88.9 fL (ref 80.0–100.0)
MPV: 9 fL (ref 7.5–12.5)
Platelets: 326 10*3/uL (ref 140–400)
RBC: 4.77 10*6/uL (ref 3.80–5.10)
RDW: 12 % (ref 11.0–15.0)
WBC: 5.7 10*3/uL (ref 3.8–10.8)

## 2019-11-12 LAB — LIPID PANEL
Cholesterol: 219 mg/dL — ABNORMAL HIGH (ref <200)
HDL: 84 mg/dL (ref >=50)
LDL Cholesterol (Calc): 120 mg/dL (calc) — ABNORMAL HIGH
Non-HDL Cholesterol (Calc): 135 mg/dL (calc) — ABNORMAL HIGH (ref <130)
Total CHOL/HDL Ratio: 2.6 (calc) (ref <5.0)
Triglycerides: 60 mg/dL (ref <150)

## 2019-11-12 LAB — VITAMIN D 25 HYDROXY (VIT D DEFICIENCY, FRACTURES): Vit D, 25-Hydroxy: 31 ng/mL (ref 30–100)

## 2019-11-12 LAB — TSH: TSH: 3.92 mIU/L (ref 0.40–4.50)

## 2019-11-20 ENCOUNTER — Ambulatory Visit: Payer: 59 | Admitting: Psychiatry

## 2019-11-26 ENCOUNTER — Ambulatory Visit: Payer: Self-pay | Admitting: Psychiatry

## 2019-12-04 NOTE — Telephone Encounter (Signed)
My chart message came back unread. Patient informed.

## 2020-01-09 ENCOUNTER — Ambulatory Visit: Payer: 59 | Admitting: Psychiatry

## 2020-01-15 ENCOUNTER — Encounter: Payer: Self-pay | Admitting: Psychiatry

## 2020-01-15 ENCOUNTER — Other Ambulatory Visit: Payer: Self-pay

## 2020-01-15 ENCOUNTER — Ambulatory Visit (INDEPENDENT_AMBULATORY_CARE_PROVIDER_SITE_OTHER): Payer: Self-pay | Admitting: Psychiatry

## 2020-01-15 DIAGNOSIS — F411 Generalized anxiety disorder: Secondary | ICD-10-CM

## 2020-01-15 DIAGNOSIS — F32A Depression, unspecified: Secondary | ICD-10-CM

## 2020-01-15 MED ORDER — BUSPIRONE HCL 15 MG PO TABS: 15.0000 mg | ORAL_TABLET | Freq: Two times a day (BID) | ORAL | 1 refills | Status: DC

## 2020-01-15 MED ORDER — LITHIUM CARBONATE 150 MG PO CAPS: 300.0000 mg | ORAL_CAPSULE | Freq: Every day | ORAL | 1 refills | Status: DC

## 2020-01-15 MED ORDER — DULOXETINE HCL 20 MG PO CPEP: 40.0000 mg | ORAL_CAPSULE | Freq: Every day | ORAL | 1 refills | Status: DC

## 2020-01-15 MED ORDER — LAMOTRIGINE 100 MG PO TABS: ORAL_TABLET | ORAL | 0 refills | Status: DC

## 2020-01-15 NOTE — Progress Notes (Signed)
Kara Webster [id number] 1955-08-30 64 y.o.  Subjective:   Patient ID:  Kara Webster is a 64 y.o. (DOB 01-07-1956) female.  Chief Complaint:  Chief Complaint  Patient presents with   Medication Problem    Cognitive side effects   Follow-up    Mood disturbance, anxiety    HPI Kara Webster presents to the office today for follow-up of mood disturbance and anxiety. She reports that she bought 6 pairs of shoes. She reports that she switched phone carriers 3 times in a month without a clear reason. She has been trying to control impulsivity.  She reports "I still have that fogginess, like I can't think." She reports feeling confused when talking with people on the phone. She reports that she has had difficulty passing CPR certification. She reports that her depression has improved. Denies elevated mood. "I have a really bad temper." She reports that irritability has been ok. She denies any recent angry outbursts. She reports adequate sleep overall. Energy has been ok. Denies excessive energy or increased goal-directed activity. Appetite has been ok. She reports that she does not like to cook and will not prepare food for herself and will eat when others prepare a meal for her. She reports that she has had difficulty with concentration and this has worsened. Denies SI.   Denies any recent ETOH use.   She continues to work periodically and plans to work every Monday starting in January.   Past Psychiatric Medication Trials: Abilify- Caused tremors. Has not noticed any worsening s/s since stopping it.  Cymbalta- Thinks this has been helpful for her depression. Has had severe discontinuation s/s with missed doses to the point of going to ER.  Lamictal- Has been helpful for mood. Reports taking long term. Gabapentin- Takes for RLS with good response. Wellbutrin- Had convulsion when taking while drinking heavily. Buspar    Review of Systems:  Review of Systems  Musculoskeletal:  Negative for gait problem.  Neurological: Positive for tremors.  Psychiatric/Behavioral:       Please refer to HPI  She reports tremor in right hand started 6-8 months ago and this has recently worsened.  Medications: I have reviewed the patient's current medications.  Current Outpatient Medications  Medication Sig Dispense Refill   busPIRone (BUSPAR) 15 MG tablet Take 1 tablet (15 mg total) by mouth 2 (two) times daily. 180 tablet 1   Cyanocobalamin (VITAMIN B 12 PO) Take by mouth.     DULoxetine (CYMBALTA) 20 MG capsule Take 2 capsules (40 mg total) by mouth daily. 60 capsule 1   lithium carbonate 150 MG capsule Take 2 capsules (300 mg total) by mouth at bedtime. 60 capsule 1   TURMERIC PO Take by mouth.     CALCIUM PO Take by mouth. (Patient not taking: Reported on 01/15/2020)     estradiol (ESTRACE) 0.5 MG tablet Take 0.5 tablets (0.25 mg total) by mouth daily. 45 tablet 4   gabapentin (NEURONTIN) 300 MG capsule Take 300 mg by mouth 3 (three) times daily.     lamoTRIgine (LAMICTAL) 100 MG tablet Take 1/2 tablet daily with a 200 mg tablet for 2 weeks, then stop and continue 200 mg tablet daily 90 tablet 0   lamoTRIgine (LAMICTAL) 200 MG tablet Take 1 tablet (200 mg total) by mouth daily. Take with a 100 mg tab to equal 300 mg total dose. 90 tablet 1   progesterone (PROMETRIUM) 100 MG capsule Take 1 capsule (100 mg total) by mouth at bedtime.  90 capsule 4   No current facility-administered medications for this visit.    Medication Side Effects: Other: Concentration difficulties  Allergies:  Allergies  Allergen Reactions   Codeine Nausea And Vomiting    Past Medical History:  Diagnosis Date   Depression    RLS (restless legs syndrome)     Family History  Problem Relation Age of Onset   Cancer Mother        leukemia    Hypertension Mother    Cancer Father        lung    Alcohol abuse Father    Diabetes Sister    Hyperlipidemia Sister     Hypertension Sister    Depression Brother    Diabetes Brother    Hypertension Brother    Alcohol abuse Maternal Uncle    Alcohol abuse Paternal Aunt    Alcohol abuse Maternal Grandfather    Alcohol abuse Brother     Social History   Socioeconomic History   Marital status: Divorced    Spouse name: Not on file   Number of children: Not on file   Years of education: Not on file   Highest education level: Not on file  Occupational History   Not on file  Tobacco Use   Smoking status: Former Smoker    Quit date: 06/01/1996    Years since quitting: 23.6   Smokeless tobacco: Never Used  Vaping Use   Vaping Use: Never used  Substance and Sexual Activity   Alcohol use: Not Currently   Drug use: Not Currently   Sexual activity: Never    Comment: 1st intercourse- 13, partners- greater than 5,   Other Topics Concern   Not on file  Social History Narrative   Not on file   Social Determinants of Health   Financial Resource Strain:    Difficulty of Paying Living Expenses: Not on file  Food Insecurity:    Worried About Charity fundraiser in the Last Year: Not on file   [employer] of Food in the Last Year: Not on file  Transportation Needs:    Lack of Transportation (Medical): Not on file   Lack of Transportation (Non-Medical): Not on file  Physical Activity:    Days of Exercise per Week: Not on file   Minutes of Exercise per Session: Not on file  Stress:    Feeling of Stress : Not on file  Social Connections:    Frequency of Communication with Friends and Family: Not on file   Frequency of Social Gatherings with Friends and Family: Not on file   Attends Religious Services: Not on file   Active Member of Clubs or Organizations: Not on file   Attends Archivist Meetings: Not on file   Marital Status: Not on file  Intimate Partner Violence:    Fear of Current or Ex-Partner: Not on file   Emotionally Abused: Not on file   Physically  Abused: Not on file   Sexually Abused: Not on file    Past Medical History, Surgical history, Social history, and Family history were reviewed and updated as appropriate.   Please see review of systems for further details on the patient's review from today.   Objective:   Physical Exam:  There were no vitals taken for this visit.  Physical Exam Constitutional:      General: She is not in acute distress. Musculoskeletal:        General: No deformity.  Neurological:  Mental Status: She is alert and oriented to person, place, and time.     Coordination: Coordination normal.  Psychiatric:        Attention and Perception: Attention and perception normal. She does not perceive auditory or visual hallucinations.        Mood and Affect: Mood normal. Mood is not anxious or depressed. Affect is not labile, blunt, angry or inappropriate.        Speech: Speech normal.        Behavior: Behavior normal.        Thought Content: Thought content normal. Thought content is not paranoid or delusional. Thought content does not include homicidal or suicidal ideation. Thought content does not include homicidal or suicidal plan.        Cognition and Memory: Cognition and memory normal.        Judgment: Judgment normal.     Comments: Insight intact     Lab Review:     Component Value Date/Time   NA 140 11/11/2019 1158   K 5.3 11/11/2019 1158   CL 105 11/11/2019 1158   CO2 27 11/11/2019 1158   GLUCOSE 83 11/11/2019 1158   BUN 7 11/11/2019 1158   CREATININE 0.79 11/11/2019 1158   CALCIUM 9.7 11/11/2019 1158   PROT 6.9 11/11/2019 1158   ALBUMIN 4.2 10/27/2018 0256   AST 20 11/11/2019 1158   ALT 7 11/11/2019 1158   ALKPHOS 55 10/27/2018 0256   BILITOT 0.7 11/11/2019 1158   GFRNONAA >60 10/27/2018 0256   GFRAA >60 10/27/2018 0256       Component Value Date/Time   WBC 5.7 11/11/2019 1158   RBC 4.77 11/11/2019 1158   HGB 14.3 11/11/2019 1158   HCT 42.4 11/11/2019 1158   PLT 326  11/11/2019 1158   MCV 88.9 11/11/2019 1158   MCV 88.8 05/27/2013 1250   MCH 30.0 11/11/2019 1158   MCHC 33.7 11/11/2019 1158   RDW 12.0 11/11/2019 1158   LYMPHSABS 1.5 10/27/2018 0256   MONOABS 0.5 10/27/2018 0256   EOSABS 0.1 10/27/2018 0256   BASOSABS 0.0 10/27/2018 0256    No results found for: POCLITH, LITHIUM   No results found for: PHENYTOIN, PHENOBARB, VALPROATE, CBMZ   .res Assessment: Plan:   Discussed that lithium has been helpful for her mood signs and symptoms and recommend continuing lithium.  Will continue lithium 150 mg 2 capsules at bedtime for mood signs and symptoms. Discussed that higher doses of lamotrigine can cause cognitive side effects and therefore recommend decreasing lamotrigine to possibly improve cognitive side effects.  Recommend decreasing lamotrigine from 300 mg to 250 mg daily for 2 weeks, then decrease to 200 mg daily. Discussed that Cymbalta could exacerbate mood lability and it may be helpful for mood signs and symptoms to decrease dose.  Patient reports significant discontinuation signs and symptoms with missed doses or abruptly stopping Cymbalta.  Therefore recommended decreasing Cymbalta gradually and reducing from 60mg  to 40 mg daily for the next month. Continue BuSpar 15 mg twice daily for anxiety. Patient to follow-up in 4 weeks or sooner if clinically indicated. Patient advised to contact office with any questions, adverse effects, or acute worsening in signs and symptoms.  Kara Webster was seen today for medication problem and follow-up.  Diagnoses and all orders for this visit:  Generalized anxiety disorder -     DULoxetine (CYMBALTA) 20 MG capsule; Take 2 capsules (40 mg total) by mouth daily. -     lamoTRIgine (LAMICTAL) 100 MG tablet; Take  1/2 tablet daily with a 200 mg tablet for 2 weeks, then stop and continue 200 mg tablet daily -     busPIRone (BUSPAR) 15 MG tablet; Take 1 tablet (15 mg total) by mouth 2 (two) times daily.  Depression,  unspecified depression type -     DULoxetine (CYMBALTA) 20 MG capsule; Take 2 capsules (40 mg total) by mouth daily. -     lamoTRIgine (LAMICTAL) 100 MG tablet; Take 1/2 tablet daily with a 200 mg tablet for 2 weeks, then stop and continue 200 mg tablet daily -     lithium carbonate 150 MG capsule; Take 2 capsules (300 mg total) by mouth at bedtime.     Please see After Visit Summary for patient specific instructions.  Future Appointments  Date Time Provider [place]  02/18/2020 11:30 AM [name], PMHNP [facility] None  10/25/2020  2:00 PM Princess Bruins, MD [facility]    No orders of the defined types were placed in this encounter.   -------------------------------

## 2020-01-15 NOTE — Patient Instructions (Signed)
Decrease Duloxetine (Cymbalta) to two 20 mg capsules to equal 40 mg.  Take 1/2 of Lamotrigine (Lamictal) 100 mg tablet with a 200 mg tablet to equal 250 mg daily for 2 weeks, then stop the 100 mg tablet and continue with taking only one 200 mg tablet.  Continue Buspirone and Lithium without any changes.

## 2020-01-30 ENCOUNTER — Telehealth: Payer: Self-pay | Admitting: Psychiatry

## 2020-01-30 NOTE — Telephone Encounter (Signed)
Kingsley Gulas called late yesterday afternoon and wants to switch counselors as she feels its not a good fit. Her number is [phone].

## 2020-02-18 ENCOUNTER — Ambulatory Visit: Payer: 59 | Admitting: Psychiatry

## 2020-03-08 ENCOUNTER — Telehealth: Payer: Self-pay | Admitting: Psychiatry

## 2020-03-08 MED ORDER — GABAPENTIN 300 MG PO CAPS
300.0000 mg | ORAL_CAPSULE | Freq: Every day | ORAL | 2 refills | Status: DC
Start: 2020-03-08 — End: 2020-05-28

## 2020-03-08 NOTE — Telephone Encounter (Signed)
Pt called and wants to know if she can get a rx for Gabapentin 300mg  1cap by mouth at hs. Pt said that her primary dr has been filling it , but told her last week to see if you could start handling the rx. Please send to [facility] on [address]

## 2020-03-09 ENCOUNTER — Telehealth: Payer: Self-pay

## 2020-03-09 NOTE — Telephone Encounter (Signed)
Patient called requesting Rx. She said years ago Clobetasol 0.5% was prescribed for her for a rash under her breasts.  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  I do not see that Rx in her med history on file so If you are okay with prescribing I will need directions. Thanks

## 2020-03-10 MED ORDER — CLOBETASOL PROPIONATE 0.05 % EX CREA: TOPICAL_CREAM | CUTANEOUS | 0 refills | Status: DC

## 2020-03-10 NOTE — Telephone Encounter (Signed)
Clobetasol cream 5.62% thin application on affected skin daily x 2 weeks, then wean to twice a week PRN.

## 2020-03-10 NOTE — Telephone Encounter (Signed)
Spoke with patient and confirmed pharmacy and reviewed Rx directions. Rx sent. ?

## 2020-03-23 ENCOUNTER — Other Ambulatory Visit: Payer: Self-pay | Admitting: Psychiatry

## 2020-03-23 DIAGNOSIS — F32A Depression, unspecified: Secondary | ICD-10-CM

## 2020-04-06 ENCOUNTER — Ambulatory Visit: Payer: Self-pay | Admitting: Psychiatry

## 2020-05-22 ENCOUNTER — Other Ambulatory Visit: Payer: Self-pay | Admitting: Psychiatry

## 2020-05-22 DIAGNOSIS — F32A Depression, unspecified: Secondary | ICD-10-CM

## 2020-05-28 ENCOUNTER — Encounter: Payer: Self-pay | Admitting: Psychiatry

## 2020-05-28 ENCOUNTER — Ambulatory Visit (INDEPENDENT_AMBULATORY_CARE_PROVIDER_SITE_OTHER): Payer: 59 | Admitting: Psychiatry

## 2020-05-28 ENCOUNTER — Other Ambulatory Visit: Payer: Self-pay

## 2020-05-28 DIAGNOSIS — F411 Generalized anxiety disorder: Secondary | ICD-10-CM | POA: Diagnosis not present

## 2020-05-28 DIAGNOSIS — F32A Depression, unspecified: Secondary | ICD-10-CM

## 2020-05-28 MED ORDER — DULOXETINE HCL 20 MG PO CPEP: 40.0000 mg | ORAL_CAPSULE | Freq: Every day | ORAL | 1 refills | Status: DC

## 2020-05-28 MED ORDER — LITHIUM CARBONATE 150 MG PO CAPS: 150.0000 mg | ORAL_CAPSULE | Freq: Every day | ORAL | 1 refills | Status: DC

## 2020-05-28 MED ORDER — GABAPENTIN 300 MG PO CAPS: 300.0000 mg | ORAL_CAPSULE | Freq: Every day | ORAL | 1 refills | Status: DC

## 2020-05-28 MED ORDER — BUSPIRONE HCL 15 MG PO TABS: 15.0000 mg | ORAL_TABLET | Freq: Two times a day (BID) | ORAL | 1 refills | Status: DC

## 2020-05-28 MED ORDER — LAMOTRIGINE 200 MG PO TABS
200.0000 mg | ORAL_TABLET | Freq: Every day | ORAL | 1 refills | Status: DC
Start: 2020-05-28 — End: 2020-10-13

## 2020-05-28 NOTE — Progress Notes (Signed)
Kara Webster [id number] 1956/02/15 65 y.o.  Subjective:   Patient ID:  Kara Webster is a 65 y.o. (DOB 05/16/1955) female.  Chief Complaint:  Chief Complaint  Patient presents with  . Follow-up    H/o mood disturbance and anxiety    HPI Kara Webster presents to the office today for follow-up of mood disturbance and anxiety. She reports that her mood has been "very good, very stable." She reports that her anxiety has improved now that she has steady work. She now recognizes that she has savings and rental property that could meet her financial needs if she had difficulty. She reports that she had a 3 month period of mania where she spent $10,000. She reports that she bought 6 pairs of shoes in one day. She denies any impulsivity or excessive spending. She reporst that her irritability has "gotten much better." She reports that she is occasionally irritated when grandchildren break things or fight. She has adapted by typically keeping only one child at a time. She reports that her concentration has improved with decrease in Lamictal. She reports that she notices some memory difficulties. She noticed sad mood one day and was thinking about the past. She reports that when she redirects her thoughts to the present. She reports that she is sleeping ok. Appetite has been good. She reports, "I've always been a binge eater." Energy and motivation have been ok. "I get everything I need to do done." She reports some procrastination. Denies SI.   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 She reports that she has taken notes about how to use the system and will review her notes before working.   Has been reading "Jesus Calling" and focusing on mindfulness, meditation, and prayer.   Denies any recent ETOH use.   Past Psychiatric Medication Trials: Abilify- Caused tremors. Has not noticed any worsening s/s since stopping it.  Cymbalta- Thinks this has been helpful for  her depression. Has had severe discontinuation s/s with missed doses to the point of going to ER.  Lamictal- Has been helpful for mood. Reports taking long term. Gabapentin- Takes for RLS with good response. Wellbutrin- Had convulsion when taking while drinking heavily. Buspar    Review of Systems:  Review of Systems  Gastrointestinal: Negative.   Musculoskeletal: Negative for gait problem.  Neurological: Negative for tremors.  Psychiatric/Behavioral:       Please refer to HPI    Medications: I have reviewed the patient's current medications.  Current Outpatient Medications  Medication Sig Dispense Refill  . CALCIUM PO Take by mouth.    . Cyanocobalamin (VITAMIN B 12 PO) Take by mouth.    . estradiol (ESTRACE) 0.5 MG tablet Take 0.5 tablets (0.25 mg total) by mouth daily. 45 tablet 4  . progesterone (PROMETRIUM) 100 MG capsule Take 1 capsule (100 mg total) by mouth at bedtime. 90 capsule 4  . TURMERIC PO Take by mouth.    . busPIRone (BUSPAR) 15 MG tablet Take 1 tablet (15 mg total) by mouth 2 (two) times daily. 180 tablet 1  . clobetasol cream (TEMOVATE) 0.05 % Thinly apply on affected skin daily x 2 weeks then wean to twice weekly prn. 30 g 0  . DULoxetine (CYMBALTA) 20 MG capsule Take 2 capsules (40 mg total) by mouth daily. 180 capsule 1  . gabapentin (NEURONTIN) 300 MG capsule Take 1 capsule (300 mg total) by mouth at bedtime. 90 capsule 1  . lamoTRIgine (LAMICTAL)  200 MG tablet Take 1 tablet (200 mg total) by mouth daily. Take with a 100 mg tab to equal 300 mg total dose. 90 tablet 1  . lithium carbonate 150 MG capsule Take 1 capsule (150 mg total) by mouth daily. 90 capsule 1   No current facility-administered medications for this visit.    Medication Side Effects: None  Allergies:  Allergies  Allergen Reactions  . Codeine Nausea And Vomiting    Past Medical History:  Diagnosis Date  . Depression   . RLS (restless legs syndrome)     Family History  Problem  Relation Age of Onset  . Cancer Mother        leukemia   . Hypertension Mother   . Cancer Father        lung   . Alcohol abuse Father   . Diabetes Sister   . Hyperlipidemia Sister   . Hypertension Sister   . Depression Brother   . Diabetes Brother   . Hypertension Brother   . Alcohol abuse Maternal Uncle   . Alcohol abuse Paternal Aunt   . Alcohol abuse Maternal Grandfather   . Alcohol abuse Brother     Social History   Socioeconomic History  . Marital status: Divorced    Spouse name: Not on file  . Number of children: Not on file  . Years of education: Not on file  . Highest education level: Not on file  Occupational History  . Not on file  Tobacco Use  . Smoking status: Former Smoker    Quit date: 06/01/1996    Years since quitting: 24.0  . Smokeless tobacco: Never Used  Vaping Use  . Vaping Use: Never used  Substance and Sexual Activity  . Alcohol use: Not Currently  . Drug use: Not Currently  . Sexual activity: Never    Comment: 1st intercourse- 89, partners- greater than 5,   Other Topics Concern  . Not on file  Social History Narrative  . Not on file   Social Determinants of Health   Financial Resource Strain: Not on file  Food Insecurity: Not on file  Transportation Needs: Not on file  Physical Activity: Not on file  Stress: Not on file  Social Connections: Not on file  Intimate Partner Violence: Not on file    Past Medical History, Surgical history, Social history, and Family history were reviewed and updated as appropriate.   Please see review of systems for further details on the patient's review from today.   Objective:   Physical Exam:  There were no vitals taken for this visit.  Physical Exam Constitutional:      General: She is not in acute distress. Musculoskeletal:        General: No deformity.  Neurological:     Mental Status: She is alert and oriented to person, place, and time.     Coordination: Coordination normal.   Psychiatric:        Attention and Perception: Attention and perception normal. She does not perceive auditory or visual hallucinations.        Mood and Affect: Mood normal. Mood is not anxious or depressed. Affect is not labile, blunt, angry or inappropriate.        Speech: Speech normal.        Behavior: Behavior normal.        Thought Content: Thought content normal. Thought content is not paranoid or delusional. Thought content does not include homicidal or suicidal ideation. Thought content does not  include homicidal or suicidal plan.        Cognition and Memory: Cognition and memory normal.        Judgment: Judgment normal.     Comments: Insight intact     Lab Review:     Component Value Date/Time   NA 140 11/11/2019 1158   K 5.3 11/11/2019 1158   CL 105 11/11/2019 1158   CO2 27 11/11/2019 1158   GLUCOSE 83 11/11/2019 1158   BUN 7 11/11/2019 1158   CREATININE 0.79 11/11/2019 1158   CALCIUM 9.7 11/11/2019 1158   PROT 6.9 11/11/2019 1158   ALBUMIN 4.2 10/27/2018 0256   AST 20 11/11/2019 1158   ALT 7 11/11/2019 1158   ALKPHOS 55 10/27/2018 0256   BILITOT 0.7 11/11/2019 1158   GFRNONAA >60 10/27/2018 0256   GFRAA >60 10/27/2018 0256       Component Value Date/Time   WBC 5.7 11/11/2019 1158   RBC 4.77 11/11/2019 1158   HGB 14.3 11/11/2019 1158   HCT 42.4 11/11/2019 1158   PLT 326 11/11/2019 1158   MCV 88.9 11/11/2019 1158   MCV 88.8 05/27/2013 1250   MCH 30.0 11/11/2019 1158   MCHC 33.7 11/11/2019 1158   RDW 12.0 11/11/2019 1158   LYMPHSABS 1.5 10/27/2018 0256   MONOABS 0.5 10/27/2018 0256   EOSABS 0.1 10/27/2018 0256   BASOSABS 0.0 10/27/2018 0256    No results found for: POCLITH, LITHIUM   No results found for: PHENYTOIN, PHENOBARB, VALPROATE, CBMZ   .res Assessment: Plan:   Will continue current plan of care since target signs and symptoms are well controlled without any tolerability issues. Patient reports that she is currently benefiting from exploring  mindfulness, meditation, and prior on her own and will hold off on resuming therapy at this time.  Advised patient to notify office if she would like to resume therapy in the future. Patient to follow-up in 3 months or sooner if clinically indicated. Patient advised to contact office with any questions, adverse effects, or acute worsening in signs and symptoms.  Enedina was seen today for follow-up.  Diagnoses and all orders for this visit:  Generalized anxiety disorder -     busPIRone (BUSPAR) 15 MG tablet; Take 1 tablet (15 mg total) by mouth 2 (two) times daily. -     DULoxetine (CYMBALTA) 20 MG capsule; Take 2 capsules (40 mg total) by mouth daily. -     lamoTRIgine (LAMICTAL) 200 MG tablet; Take 1 tablet (200 mg total) by mouth daily. Take with a 100 mg tab to equal 300 mg total dose.  Depression, unspecified depression type -     DULoxetine (CYMBALTA) 20 MG capsule; Take 2 capsules (40 mg total) by mouth daily. -     lamoTRIgine (LAMICTAL) 200 MG tablet; Take 1 tablet (200 mg total) by mouth daily. Take with a 100 mg tab to equal 300 mg total dose. -     lithium carbonate 150 MG capsule; Take 1 capsule (150 mg total) by mouth daily.  Other orders -     gabapentin (NEURONTIN) 300 MG capsule; Take 1 capsule (300 mg total) by mouth at bedtime.     Please see After Visit Summary for patient specific instructions.  Future Appointments  Date Time Provider [place]  08/27/2020  8:30 AM [name], PMHNP [facility] None  10/25/2020  2:00 PM Princess Bruins, MD [facility] None    No orders of the defined types were placed in this encounter.   -------------------------------

## 2020-07-23 ENCOUNTER — Other Ambulatory Visit: Payer: Self-pay | Admitting: Psychiatry

## 2020-07-23 DIAGNOSIS — F32A Depression, unspecified: Secondary | ICD-10-CM

## 2020-07-23 NOTE — Telephone Encounter (Signed)
review 

## 2020-07-29 ENCOUNTER — Other Ambulatory Visit: Payer: Self-pay | Admitting: Psychiatry

## 2020-07-29 DIAGNOSIS — F32A Depression, unspecified: Secondary | ICD-10-CM

## 2020-07-29 NOTE — Telephone Encounter (Signed)
Pt out of Lithium. Please sent new Rx to [facility]. Ran out yesterday.  Apt 6/10

## 2020-08-27 ENCOUNTER — Ambulatory Visit: Payer: 59 | Admitting: Psychiatry

## 2020-10-13 ENCOUNTER — Other Ambulatory Visit: Payer: Self-pay

## 2020-10-13 ENCOUNTER — Ambulatory Visit (INDEPENDENT_AMBULATORY_CARE_PROVIDER_SITE_OTHER): Payer: 59 | Admitting: Psychiatry

## 2020-10-13 ENCOUNTER — Encounter: Payer: Self-pay | Admitting: Psychiatry

## 2020-10-13 DIAGNOSIS — F411 Generalized anxiety disorder: Secondary | ICD-10-CM | POA: Diagnosis not present

## 2020-10-13 DIAGNOSIS — F32A Depression, unspecified: Secondary | ICD-10-CM | POA: Diagnosis not present

## 2020-10-13 MED ORDER — DULOXETINE HCL 20 MG PO CPEP: 40.0000 mg | ORAL_CAPSULE | Freq: Every day | ORAL | 1 refills | Status: DC

## 2020-10-13 MED ORDER — LITHIUM CARBONATE 150 MG PO CAPS: 450.0000 mg | ORAL_CAPSULE | Freq: Every day | ORAL | 1 refills | Status: DC

## 2020-10-13 MED ORDER — LAMOTRIGINE 200 MG PO TABS: 200.0000 mg | ORAL_TABLET | Freq: Every day | ORAL | 1 refills | Status: DC

## 2020-10-13 MED ORDER — BUSPIRONE HCL 15 MG PO TABS: 15.0000 mg | ORAL_TABLET | Freq: Two times a day (BID) | ORAL | 1 refills | Status: DC

## 2020-10-13 MED ORDER — GABAPENTIN 300 MG PO CAPS: 300.0000 mg | ORAL_CAPSULE | Freq: Every day | ORAL | 1 refills | Status: DC

## 2020-10-14 NOTE — Progress Notes (Signed)
Kara Webster [id number] 03-27-1955 65 y.o.  Subjective:   Patient ID:  Kara Webster is a 65 y.o. (DOB 01-24-1956) female.  Chief Complaint:  Chief Complaint  Patient presents with   Other    Anger issues    HPI Kara Webster presents to the office today for follow-up of mood disturbance and anxiety. She reports that she is "just ok." She reports that she has "always had an ager issue." She reports that while she was working that she gave someone an angry expression and thinks it ruined a potential friendship. She reports that she is experiencing anger with her 23 yo granddaughter who has behavior issues and mood s/s. She reports that she has been able to control her anger better in a few situations. She reports that anger has been improved somewhat with medications. She denies any recent confrontations. She reports that depression is "ok, I'll never be through that." She reports that anxiety is "ok" and describes some worry about family. Denies any elevated moods, impulsivity, or excessive spending. She reports that her sleep is "off and on." Appetite has been higher than she would like and reports that she is gaining weight. Energy and motivation have been ok. She reports difficulty focusing on one thing. She reports that she will start multiple projects simultaneously. She has difficulty staying focused when reading. She reports that she has had occasional chronic suicidal thoughts without intent and contracts for safety.   Has been practicing mindfulness and meditation. She also has been incorporating stretches. She reports that she is working on recognizing her own behaviors.   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 Reports strained relationship with youngest daughter.   Past Psychiatric Medication Trials: Abilify- Caused tremors. Has not noticed any worsening s/s since stopping  it. Cymbalta- Thinks this has been helpful for her depression. Has had severe discontinuation s/s with missed doses to the point of going to ER. Lamictal- Has been helpful for mood. Reports taking long term. Gabapentin- Takes for RLS with good response. Wellbutrin- Had convulsion when taking while drinking heavily.  Buspar    Review of Systems:  Review of Systems  Musculoskeletal:  Negative for gait problem.  Neurological:  Negative for tremors.  Psychiatric/Behavioral:         Please refer to HPI   She reports that she has occasional mouth movements and clinching when she is stressed. She reports that she noticed these movements on videos.  Medications: I have reviewed the patient's current medications.  Current Outpatient Medications  Medication Sig Dispense Refill   CALCIUM PO Take by mouth.     Cyanocobalamin (VITAMIN B 12 PO) Take by mouth.     estradiol (ESTRACE) 0.5 MG tablet Take 0.5 tablets (0.25 mg total) by mouth daily. 45 tablet 4   progesterone (PROMETRIUM) 100 MG capsule Take 1 capsule (100 mg total) by mouth at bedtime. 90 capsule 4   TURMERIC PO Take by mouth.     busPIRone (BUSPAR) 15 MG tablet Take 1 tablet (15 mg total) by mouth 2 (two) times daily. 180 tablet 1   clobetasol cream (TEMOVATE) 0.05 % Thinly apply on affected skin daily x 2 weeks then wean to twice weekly prn. (Patient not taking: Reported on 10/13/2020) 30 g 0   DULoxetine (CYMBALTA) 20 MG capsule Take 2 capsules (40 mg total) by mouth daily.  180 capsule 1   gabapentin (NEURONTIN) 300 MG capsule Take 1 capsule (300 mg total) by mouth at bedtime. 90 capsule 1   lamoTRIgine (LAMICTAL) 200 MG tablet Take 1 tablet (200 mg total) by mouth daily. Take with a 100 mg tab to equal 300 mg total dose. 90 tablet 1   lithium carbonate 150 MG capsule Take 3 capsules (450 mg total) by mouth at bedtime. 270 capsule 1   No current facility-administered medications for this visit.    Medication Side Effects:  None  Allergies:  Allergies  Allergen Reactions   Codeine Nausea And Vomiting    Past Medical History:  Diagnosis Date   Depression    RLS (restless legs syndrome)     Past Medical History, Surgical history, Social history, and Family history were reviewed and updated as appropriate.   Please see review of systems for further details on the patient's review from today.   Objective:   Physical Exam:  There were no vitals taken for this visit.  Physical Exam Constitutional:      General: She is not in acute distress. Musculoskeletal:        General: No deformity.  Neurological:     Mental Status: She is alert and oriented to person, place, and time.     Coordination: Coordination normal.  Psychiatric:        Attention and Perception: Attention and perception normal. She does not perceive auditory or visual hallucinations.        Mood and Affect: Mood normal. Mood is not anxious or depressed. Affect is not labile, blunt, angry or inappropriate.        Speech: Speech normal.        Behavior: Behavior normal.        Thought Content: Thought content normal. Thought content is not paranoid or delusional. Thought content does not include homicidal or suicidal ideation. Thought content does not include homicidal or suicidal plan.        Cognition and Memory: Cognition and memory normal.        Judgment: Judgment normal.     Comments: Insight intact   Physical Exam  Lab Review:     Component Value Date/Time   NA 140 11/11/2019 1158   K 5.3 11/11/2019 1158   CL 105 11/11/2019 1158   CO2 27 11/11/2019 1158   GLUCOSE 83 11/11/2019 1158   BUN 7 11/11/2019 1158   CREATININE 0.79 11/11/2019 1158   CALCIUM 9.7 11/11/2019 1158   PROT 6.9 11/11/2019 1158   ALBUMIN 4.2 10/27/2018 0256   AST 20 11/11/2019 1158   ALT 7 11/11/2019 1158   ALKPHOS 55 10/27/2018 0256   BILITOT 0.7 11/11/2019 1158   GFRNONAA >60 10/27/2018 0256   GFRAA >60 10/27/2018 0256       Component Value  Date/Time   WBC 5.7 11/11/2019 1158   RBC 4.77 11/11/2019 1158   HGB 14.3 11/11/2019 1158   HCT 42.4 11/11/2019 1158   PLT 326 11/11/2019 1158   MCV 88.9 11/11/2019 1158   MCV 88.8 05/27/2013 1250   MCH 30.0 11/11/2019 1158   MCHC 33.7 11/11/2019 1158   RDW 12.0 11/11/2019 1158   LYMPHSABS 1.5 10/27/2018 0256   MONOABS 0.5 10/27/2018 0256   EOSABS 0.1 10/27/2018 0256   BASOSABS 0.0 10/27/2018 0256    No results found for: POCLITH, LITHIUM   No results found for: PHENYTOIN, PHENOBARB, VALPROATE, CBMZ   .res Assessment: Plan:    Discussed potential benefits, risks,  and side effects of increasing Lithium to 450 mg at bedtime for anger and mood lability since she reports partial improvement with Lithium 300 mg po QHS. Pt agrees to increase in Lithium.  Continue Cymbalta 40 mg po qd for anxiety and depression.  Continue Lamictal for mood stabilization. Continue Buspar 15 mg po BID for anxiety.  Continue Gabapentin 300 mg po QHS for anxiety.  Pt reports that she would like to resume therapy, however it is cost prohibitive at this time.  Pt to follow-up in 6 months or sooner if clinically indicated.  Patient advised to contact office with any questions, adverse effects, or acute worsening in signs and symptoms.   Lemma was seen today for other.  Diagnoses and all orders for this visit:  Depression, unspecified depression type -     lithium carbonate 150 MG capsule; Take 3 capsules (450 mg total) by mouth at bedtime. -     DULoxetine (CYMBALTA) 20 MG capsule; Take 2 capsules (40 mg total) by mouth daily. -     lamoTRIgine (LAMICTAL) 200 MG tablet; Take 1 tablet (200 mg total) by mouth daily. Take with a 100 mg tab to equal 300 mg total dose.  Generalized anxiety disorder -     busPIRone (BUSPAR) 15 MG tablet; Take 1 tablet (15 mg total) by mouth 2 (two) times daily. -     DULoxetine (CYMBALTA) 20 MG capsule; Take 2 capsules (40 mg total) by mouth daily. -     gabapentin  (NEURONTIN) 300 MG capsule; Take 1 capsule (300 mg total) by mouth at bedtime. -     lamoTRIgine (LAMICTAL) 200 MG tablet; Take 1 tablet (200 mg total) by mouth daily. Take with a 100 mg tab to equal 300 mg total dose.    Please see After Visit Summary for patient specific instructions.  Future Appointments  Date Time Provider [place]  10/18/2020  8:45 AM Lavonna Monarch, MD [facility]  12/02/2020 10:00 AM Princess Bruins, MD [facility] None  04/15/2021  9:30 AM [name], PMHNP [facility] None    No orders of the defined types were placed in this encounter.   -------------------------------

## 2020-10-18 ENCOUNTER — Other Ambulatory Visit: Payer: Self-pay

## 2020-10-18 ENCOUNTER — Ambulatory Visit (INDEPENDENT_AMBULATORY_CARE_PROVIDER_SITE_OTHER): Payer: 59 | Admitting: Dermatology

## 2020-10-18 DIAGNOSIS — C4492 Squamous cell carcinoma of skin, unspecified: Secondary | ICD-10-CM

## 2020-10-18 DIAGNOSIS — D485 Neoplasm of uncertain behavior of skin: Secondary | ICD-10-CM

## 2020-10-18 DIAGNOSIS — D0439 Carcinoma in situ of skin of other parts of face: Secondary | ICD-10-CM | POA: Diagnosis not present

## 2020-10-18 DIAGNOSIS — D229 Melanocytic nevi, unspecified: Secondary | ICD-10-CM

## 2020-10-18 DIAGNOSIS — D225 Melanocytic nevi of trunk: Secondary | ICD-10-CM

## 2020-10-18 HISTORY — DX: Squamous cell carcinoma of skin, unspecified: C44.92

## 2020-10-18 NOTE — Patient Instructions (Signed)

## 2020-10-22 ENCOUNTER — Other Ambulatory Visit: Payer: Self-pay | Admitting: Psychiatry

## 2020-10-22 DIAGNOSIS — F32A Depression, unspecified: Secondary | ICD-10-CM

## 2020-10-22 DIAGNOSIS — F411 Generalized anxiety disorder: Secondary | ICD-10-CM

## 2020-10-25 ENCOUNTER — Ambulatory Visit: Payer: Self-pay | Admitting: Obstetrics & Gynecology

## 2020-10-26 ENCOUNTER — Telehealth: Payer: Self-pay

## 2020-10-26 NOTE — Telephone Encounter (Signed)
-----   Message from Lavonna Monarch, MD sent at 10/22/2020  7:00 PM EDT ----- Schedule surgery with Dr. Darene Lamer

## 2020-10-26 NOTE — Telephone Encounter (Signed)
Phone call to patient with her pathology results. Voicemail left for patient to give the office a call back.  

## 2020-10-27 NOTE — Telephone Encounter (Signed)
Second attempt to give path report next available surgery made with dr Denna Haggard patient has mychart

## 2020-10-27 NOTE — Telephone Encounter (Signed)
-----   Message from Lavonna Monarch, MD sent at 10/22/2020  7:00 PM EDT ----- Schedule surgery with Dr. Darene Lamer

## 2020-11-01 NOTE — Telephone Encounter (Signed)
Phone call from patient returning our call for her pathology results. Patient aware of results.

## 2020-11-02 ENCOUNTER — Telehealth: Payer: Self-pay | Admitting: Dermatology

## 2020-11-02 NOTE — Telephone Encounter (Signed)
Patient is confused. She's scheduled for surgery on 12/23/20 and she thinks the diagnosis was for rosacea

## 2020-11-02 NOTE — Telephone Encounter (Signed)
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

## 2020-11-03 ENCOUNTER — Encounter: Payer: Self-pay | Admitting: Dermatology

## 2020-11-03 NOTE — Progress Notes (Signed)
   Follow-Up Visit   Subjective  Kara Webster is a 65 y.o. female who presents for the following: Skin Problem (Here to have her face looked at. She has one lesion on her nose that she is worried about. X awhile. It does bleed sometimes. ).  Check spot on nose and several other areas. Location:  Duration:  Quality:  Associated Signs/Symptoms: Modifying Factors:  Severity:  Timing: Context:   Objective  Well appearing patient in no apparent distress; mood and affect are within normal limits. Mid Tip of Nose Subtle waxy 32m flat pink crust, rule out superficial carcinoma       Torso - Posterior (Back) Back in sun exposed areas examined; no atypical pigmented lesions.    All sun exposed areas plus back examined.   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  Skin / nail biopsy Type of biopsy: tangential   Informed consent: discussed and consent obtained   Timeout: patient name, date of birth, surgical site, and procedure verified   Procedure prep:  Patient was prepped and draped in usual sterile fashion (Non sterile) Prep type:  Chlorhexidine Anesthesia: the lesion was anesthetized in a standard fashion   Anesthetic:  1% lidocaine w/ epinephrine 1-100,000 local infiltration Instrument used: flexible razor blade   Outcome: patient tolerated procedure well   Post-procedure details: wound care instructions given    Specimen 1 - Surgical pathology Differential Diagnosis: bcc vs scc  Check Margins: No  Nevus Torso - Posterior (Back)      I, SLavonna Monarch MD, have reviewed all documentation for this visit.  The documentation on 11/03/20 for the exam, diagnosis, procedures, and orders are all accurate and complete.

## 2020-11-04 ENCOUNTER — Other Ambulatory Visit: Payer: Self-pay | Admitting: Obstetrics & Gynecology

## 2020-11-04 NOTE — Telephone Encounter (Signed)
Phone call to patient to inform her that she does need the appointment for her nose. Patient states that she will keep the appointment.

## 2020-11-09 ENCOUNTER — Other Ambulatory Visit: Payer: Self-pay

## 2020-11-09 MED ORDER — ESTRADIOL 0.5 MG PO TABS: 0.2500 mg | ORAL_TABLET | Freq: Every day | ORAL | 0 refills | Status: DC

## 2020-11-09 MED ORDER — PROGESTERONE MICRONIZED 100 MG PO CAPS: 100.0000 mg | ORAL_CAPSULE | Freq: Every day | ORAL | 0 refills | Status: DC

## 2020-11-09 NOTE — Telephone Encounter (Signed)
Last AEX 10/23/19 Scheduled 12/02/20 (we r/s 10/25/20)

## 2020-11-09 NOTE — Telephone Encounter (Signed)
Annual exam scheduled on 12/02/20. Per note patient was to wean.

## 2020-11-18 ENCOUNTER — Telehealth: Payer: Self-pay

## 2020-11-18 NOTE — Telephone Encounter (Signed)
Phone call from patient needing a better understanding with the procedure she's having done verses MOH's.  Patient states that her sister had the MOH's procedure done and she has a big scar and patient wanted to know if she was going to have a big scar on her nose like her sisters?  I explained the difference between the curet procedure she'll have done here in the office and the [facility] procedure. Per patient she understands now and feels much better about her upcoming appointment.

## 2020-11-29 ENCOUNTER — Telehealth: Payer: Self-pay | Admitting: Dermatology

## 2020-11-29 NOTE — Telephone Encounter (Signed)
[facility] called to say that they need demographics on patient and if you choose to fax it  the fax # is [phone].

## 2020-11-29 NOTE — Telephone Encounter (Signed)
[facility] at [phone]

## 2020-12-02 ENCOUNTER — Ambulatory Visit: Payer: 59 | Admitting: Obstetrics & Gynecology

## 2020-12-23 ENCOUNTER — Encounter: Payer: 59 | Admitting: Dermatology

## 2020-12-24 ENCOUNTER — Ambulatory Visit (INDEPENDENT_AMBULATORY_CARE_PROVIDER_SITE_OTHER): Payer: 59 | Admitting: Psychiatry

## 2020-12-24 ENCOUNTER — Other Ambulatory Visit (HOSPITAL_COMMUNITY)
Admission: EM | Admit: 2020-12-24 | Discharge: 2020-12-27 | Disposition: A | Discharge status: Other Acute Inpatient Hospital | Payer: 59 | Attending: Psychiatry | Admitting: Psychiatry

## 2020-12-24 ENCOUNTER — Other Ambulatory Visit: Payer: Self-pay

## 2020-12-24 DIAGNOSIS — Z87891 Personal history of nicotine dependence: Secondary | ICD-10-CM | POA: Diagnosis not present

## 2020-12-24 DIAGNOSIS — F3160 Bipolar disorder, current episode mixed, unspecified: Secondary | ICD-10-CM | POA: Diagnosis not present

## 2020-12-24 DIAGNOSIS — F32A Depression, unspecified: Secondary | ICD-10-CM | POA: Diagnosis not present

## 2020-12-24 DIAGNOSIS — F411 Generalized anxiety disorder: Secondary | ICD-10-CM | POA: Diagnosis not present

## 2020-12-24 DIAGNOSIS — R45851 Suicidal ideations: Secondary | ICD-10-CM | POA: Insufficient documentation

## 2020-12-24 DIAGNOSIS — Z20822 Contact with and (suspected) exposure to covid-19: Secondary | ICD-10-CM | POA: Insufficient documentation

## 2020-12-24 DIAGNOSIS — F3110 Bipolar disorder, current episode manic without psychotic features, unspecified: Secondary | ICD-10-CM | POA: Diagnosis present

## 2020-12-24 LAB — RESP PANEL BY RT-PCR (FLU A&B, COVID) ARPGX2
Influenza A by PCR: NEGATIVE
Influenza B by PCR: NEGATIVE
SARS Coronavirus 2 by RT PCR: NEGATIVE

## 2020-12-24 LAB — POC SARS CORONAVIRUS 2 AG: SARSCOV2ONAVIRUS 2 AG: NEGATIVE

## 2020-12-24 MED ORDER — HYDROXYZINE HCL 25 MG PO TABS: 25.0000 mg | ORAL_TABLET | Freq: Three times a day (TID) | ORAL | Status: DC | PRN

## 2020-12-24 MED ORDER — OLANZAPINE 10 MG IM SOLR: 10.0000 mg | Freq: Once | INTRAMUSCULAR | Status: AC

## 2020-12-24 MED ORDER — LORAZEPAM 1 MG PO TABS: 1.0000 mg | ORAL_TABLET | ORAL | Status: DC | PRN

## 2020-12-24 MED ORDER — TRAZODONE HCL 50 MG PO TABS: 50.0000 mg | ORAL_TABLET | Freq: Every evening | ORAL | Status: DC | PRN

## 2020-12-24 MED ORDER — LORAZEPAM 1 MG PO TABS: 2.0000 mg | ORAL_TABLET | Freq: Once | ORAL | Status: AC

## 2020-12-24 MED ORDER — OLANZAPINE 10 MG IM SOLR
INTRAMUSCULAR | Status: AC
  Administered 2020-12-24: 10 mg via INTRAMUSCULAR
  Filled 2020-12-24: qty 10

## 2020-12-24 MED ORDER — DULOXETINE HCL 20 MG PO CPEP
40.0000 mg | ORAL_CAPSULE | Freq: Every day | ORAL | Status: DC
  Administered 2020-12-25 – 2020-12-27 (×3): 40 mg via ORAL
  Filled 2020-12-24 (×3): qty 2

## 2020-12-24 MED ORDER — LORAZEPAM 2 MG/ML IJ SOLN: 2.0000 mg | Freq: Once | INTRAMUSCULAR | Status: AC

## 2020-12-24 MED ORDER — MAGNESIUM HYDROXIDE 400 MG/5ML PO SUSP: 30.0000 mL | Freq: Every day | ORAL | Status: DC | PRN

## 2020-12-24 MED ORDER — ALUM & MAG HYDROXIDE-SIMETH 200-200-20 MG/5ML PO SUSP: 30.0000 mL | ORAL | Status: DC | PRN

## 2020-12-24 MED ORDER — OLANZAPINE 5 MG PO TBDP
5.0000 mg | ORAL_TABLET | Freq: Once | ORAL | Status: AC
Start: 2020-12-24 — End: 2020-12-26
  Administered 2020-12-26: 5 mg via ORAL
  Filled 2020-12-24 (×2): qty 1

## 2020-12-24 MED ORDER — LORAZEPAM 2 MG/ML IJ SOLN
INTRAMUSCULAR | Status: AC
  Administered 2020-12-24: 2 mg via INTRAMUSCULAR
  Filled 2020-12-24: qty 1

## 2020-12-24 MED ORDER — ZIPRASIDONE MESYLATE 20 MG IM SOLR: 20.0000 mg | INTRAMUSCULAR | Status: DC | PRN

## 2020-12-24 MED ORDER — ACETAMINOPHEN 325 MG PO TABS
650.0000 mg | ORAL_TABLET | Freq: Four times a day (QID) | ORAL | Status: DC | PRN
  Administered 2020-12-26: 650 mg via ORAL
  Filled 2020-12-24: qty 2

## 2020-12-24 MED ORDER — GABAPENTIN 300 MG PO CAPS
300.0000 mg | ORAL_CAPSULE | Freq: Every day | ORAL | Status: DC
  Administered 2020-12-25 – 2020-12-26 (×2): 300 mg via ORAL
  Filled 2020-12-24 (×2): qty 1

## 2020-12-24 MED ORDER — BUSPIRONE HCL 15 MG PO TABS
15.0000 mg | ORAL_TABLET | Freq: Two times a day (BID) | ORAL | Status: DC
  Administered 2020-12-25 – 2020-12-27 (×5): 15 mg via ORAL
  Filled 2020-12-24 (×5): qty 1

## 2020-12-24 MED ORDER — LAMOTRIGINE 150 MG PO TABS
300.0000 mg | ORAL_TABLET | Freq: Every day | ORAL | Status: DC
  Administered 2020-12-25 – 2020-12-27 (×3): 300 mg via ORAL
  Filled 2020-12-24 (×3): qty 2

## 2020-12-24 NOTE — ED Notes (Signed)
Pt sedated at present., resting at present, no distress noted, respirations even & unlabored.  Monitoring for safety.

## 2020-12-24 NOTE — BH Assessment (Signed)
Pt reports she has been suicidal for the past 2 months. Pt states "I'm tired, angry with everyone and I've been acting out". Pt reports having a plan to kill self however states "I'm not going to tell you". Pt reports she is tired of repeating herself. Pt denies HI, AVH and substance use. Pt was seen by psych today and recommended to come here for crisis assessment.   Pt is emergent

## 2020-12-24 NOTE — BH Assessment (Signed)
Comprehensive Clinical Assessment (CCA) Screening, Triage and Referral Note  12/24/2020 Kara Webster [id number]  Disposition: Per Sheran Fava, FNP patient is recommended admission into [facility].   [place] [facility] from 12/24/2020 in [place] [facility]      The patient demonstrates the following risk factors for suicide: Chronic risk factors for suicide include: psychiatric disorder of depression , substance use disorder, and demographic factors (female, >7 y/o). Acute risk factors for suicide include: family or marital conflict and social withdrawal/isolation. Protective factors for this patient include: positive therapeutic relationship. Considering these factors, the overall suicide risk at this point appears to be high. Patient is not appropriate for outpatient follow up.   Kara Webster is a 65 year old female presenting to [facility] voluntarily after going to her psychiatrist appointment reporting SI.  Patient reports she has been suicidal for the past 2 months. Patient states "I'm tired, angry with everyone and I've been acting out". Patient reports having a plan to kill self however states "I'm not going to tell you". When asked if she has attempted suicide she states, "It's not going to be an attempt, I'm going to do it".  Patient reports she is tired of repeating herself and sharing the same information. Patient denies HI, AVH and substance use. Patient reports inpatient treatment several years ago. Patient daughter called looking for her, but she refuses to give consent for staff to talk with her daughter at this time.   Chief Complaint:  Chief Complaint  Patient presents with   Suicidal   Visit Diagnosis: MDD, severe, recurrent without psychotic features  Patient Reported Information How did you hear about [place]? Other (Comment)  What Is the Reason for Your Visit/Call Today? SI with plan  How Long Has This Been Causing You  Problems? 1 wk - 1 month  What Do You Feel Would Help You the Most Today? Treatment for Depression or other mood problem   Have You Recently Had Any Thoughts About Hurting Yourself? Yes  Are You Planning to Commit Suicide/Harm Yourself At This time? Yes   Have you Recently Had Thoughts About Hurting Someone Kara Webster? No  Are You Planning to Harm Someone at This Time? No  Explanation: No data recorded  Have You Used Any Alcohol or Drugs in the Past 24 Hours? No  How Long Ago Did You Use Drugs or Alcohol? No data recorded What Did You Use and How Much? No data recorded  Do You Currently Have a Therapist/Psychiatrist? Yes  Name of Therapist/Psychiatrist: No data recorded  Have You Been Recently Discharged From Any Office Practice or Programs? No  Explanation of Discharge From Practice/Program: No data recorded   CCA Screening Triage Referral Assessment Type of Contact: Face-to-Face  Telemedicine Service Delivery:   Is this Initial or Reassessment? No data recorded Date Telepsych consult ordered in [facility]:  No data recorded Time Telepsych consult ordered in [facility]:  No data recorded Location of Assessment: [facility] Assessment Services  Provider Location: [facility] Assessment Services   Collateral Involvement: No data recorded  Does Patient Have a [place]? No data recorded Name and Contact of Legal Guardian: No data recorded If Minor and Not Living with Parent(s), Who has Custody? No data recorded Is CPS involved or ever been involved? No data recorded Is APS involved or ever been involved? No data recorded  Patient Determined To Be At Risk for Harm To Self or Others Based on Review of Patient  Reported Information or Presenting Complaint? Yes, for Self-Harm  Method: No data recorded Availability of Means: No data recorded Intent: No data recorded Notification Required: No data recorded Additional Information for Danger to Others Potential: No data  recorded Additional Comments for Danger to Others Potential: No data recorded Are There Guns or Other Weapons in Your Home? No data recorded Types of Guns/Weapons: No data recorded Are These Weapons Safely Secured?                            No data recorded Who Could Verify You Are Able To Have These Secured: No data recorded Do You Have any Outstanding Charges, Pending Court Dates, Parole/Probation? No data recorded Contacted To Inform of Risk of Harm To Self or Others: No data recorded  Does Patient Present under Involuntary Commitment? No  IVC Papers Initial File Date: No data recorded  [place] of Residence: [place]   Patient Currently Receiving the Following Services: Medication Management; Individual Therapy   Determination of Need: Emergent (2 hours)   Options For Referral: Inpatient Hospitalization; Outpatient Therapy; Medication Management   Discharge Disposition:     [name], [facility]

## 2020-12-24 NOTE — ED Provider Notes (Signed)
Behavioral Health Admission H&P [facility] & OBS)  Date: 12/24/20 Patient Name: Kara Webster MRN: [id number] Chief Complaint:  Chief Complaint  Patient presents with   Suicidal      Diagnoses:  Final diagnoses:  None    HPI: DEZIYAH Webster is a 65 year old female with psychiatric history of depression, generalized anxiety anxiety disorder, and bipolar 1 disorder.  Patient presented to [facility] reporting worsening depression and suicidal ideation.  Patient was seen and evaluated earlier by Sheran Fava, NP and recommended for [facility] admission for treatment of worsening depression and suicidal thoughts.  This Probation officer met with patient face-to-face and reviewed her chart.  On approach, patient is alert and oriented X4, she is visibly angry and agitated.  She is verbally aggressive towards staff and requesting to be discharged. Patient states " I want to get out of here so I can go home and slit my throat."  Patient reported that she is suicidal and that she is no longer interested in seeking treatment for depression and suicidal ideation.  Patient is unable to contract for safety and continues to demand to be discharged.  Writer explained to patient that due to her inability to contract for safety and imminent risk to self, she will be involuntarily committed.   Per Suella Broad, FNP 12/24/20 @ 16:12: Kara Webster is a 65 y.o. female who presents to [facility] with worsening depression and suicidal thoughts with a plan. SHe does not wish to disclose of this plan at this time. She was seen early toay by [name], who advised she come here for further evaluation. HPI from [name] states :  She reports, "I'm very suicidal... I think I need to go somewhere... my emotions are every where... I'm having panic attacks." She reports suicidal intent and plan. "I know where and how, it's just a matter of when... I think about it all the time...but deep down in  my heart I know that's not what I want to do." She reports SI for several weeks or months. "One minute I am fine, and the next minute I want to commit suicide.... I just want out." Reports calling suicide hotline this morning 30-60 minutes after feeling "fine" and making plans for the evening.   PHQ 2-9:   [place] [facility] from 12/24/2020 in [place] [facility]        Total Time spent with patient: 15 minutes  Musculoskeletal  Strength & Muscle Tone: within normal limits Gait & Station: normal Patient leans: Right  Psychiatric Specialty Exam  Presentation General Appearance: Appropriate for Environment; Casual  Eye Contact:Fair  Speech:Clear and Coherent; Normal Rate  Speech Volume:Decreased  Handedness:Right   Mood and Affect  Mood:Depressed; Dysphoric  Affect:Flat; Depressed   Thought Process  Thought Processes:Coherent; Linear  Descriptions of Associations:Intact  Orientation:Full (Time, Place and Person)  Thought Content:Logical    Hallucinations:Hallucinations: None  Ideas of Reference:None  Suicidal Thoughts:Suicidal Thoughts: Yes, Active SI Active Intent and/or Plan: With Intent; With Plan; With Means to [place]; With Access to Means  Homicidal Thoughts:Homicidal Thoughts: No   Sensorium  Memory:Immediate Fair; Recent Fair; Remote Fair  Judgment:Fair  Insight:Fair   Executive Functions  Concentration:Fair  Attention Span:Fair  [place]   Psychomotor Activity  Psychomotor Activity:Psychomotor Activity: Normal   Assets  Assets:Financial Resources/Insurance; Desire for Improvement; Communication Skills; Housing   Sleep  Sleep:Sleep: Fair  No data recorded  Physical Exam ROS  Blood pressure (!) 144/61, pulse 98, temperature 98.2 F (36.8 C), temperature source Oral, resp. rate 20, height 5' 4" (1.626 m), weight 133 lb (60.3  kg), SpO2 99 %. Body mass index is 22.83 kg/m.  Past Psychiatric History: depression, generalized anxiety anxiety disorder, and bipolar 1 disorder   Is the patient at risk to self? Yes  Has the patient been a risk to self in the past 6 months? No .    Has the patient been a risk to self within the distant past? No   Is the patient a risk to others? No   Has the patient been a risk to others in the past 6 months? No   Has the patient been a risk to others within the distant past? No   Past Medical History:  Past Medical History:  Diagnosis Date   Depression    RLS (restless legs syndrome)    Squamous cell carcinoma of skin 10/18/2020   Mid Tip of Nose (in situ)    Past Surgical History:  Procedure Laterality Date   BLADDER SURGERY     TUBAL LIGATION      Family History:  Family History  Problem Relation Age of Onset   Cancer Mother        leukemia    Hypertension Mother    Cancer Father        lung    Alcohol abuse Father    Diabetes Sister    Hyperlipidemia Sister    Hypertension Sister    Depression Brother    Diabetes Brother    Hypertension Brother    Alcohol abuse Maternal Uncle    Alcohol abuse Paternal Aunt    Alcohol abuse Maternal Grandfather    Alcohol abuse Brother     Social History:  Social History   Socioeconomic History   Marital status: Divorced    Spouse name: Not on file   Number of children: Not on file   Years of education: Not on file   Highest education level: Not on file  Occupational History   Not on file  Tobacco Use   Smoking status: Former    Types: Cigarettes    Quit date: 06/01/1996    Years since quitting: 24.5   Smokeless tobacco: Never  Vaping Use   Vaping Use: Never used  Substance and Sexual Activity   Alcohol use: Not Currently   Drug use: Not Currently   Sexual activity: Never    Comment: 1st intercourse- 68, partners- greater than 5,   Other Topics Concern   Not on file  Social History Narrative   Not on file    Social Determinants of Health   Financial Resource Strain: Not on file  Food Insecurity: Not on file  Transportation Needs: Not on file  Physical Activity: Not on file  Stress: Not on file  Social Connections: Not on file  Intimate Partner Violence: Not on file    SDOH:  SDOH Screenings   Alcohol Screen: Not on file  Depression (PHQ2-9): Not on file  Financial Resource Strain: Not on file  Food Insecurity: Not on file  Housing: Not on file  Physical Activity: Not on file  Social Connections: Not on file  Stress: Not on file  Tobacco Use: Medium Risk   Smoking Tobacco Use: Former   Smokeless Tobacco Use: Never  Transportation Needs: Not on file    Last Labs:  Admission on 12/24/2020  Component Date  Value Ref Range Status   SARS Coronavirus 2 by RT PCR 12/24/2020 NEGATIVE  NEGATIVE Final   Comment: (NOTE) SARS-CoV-2 target nucleic acids are NOT DETECTED.  The SARS-CoV-2 RNA is generally detectable in upper respiratory specimens during the acute phase of infection. The lowest concentration of SARS-CoV-2 viral copies this assay can detect is 138 copies/mL. A negative result does not preclude SARS-Cov-2 infection and should not be used as the sole basis for treatment or other patient management decisions. A negative result may occur with  improper specimen collection/handling, submission of specimen other than nasopharyngeal swab, presence of viral mutation(s) within the areas targeted by this assay, and inadequate number of viral copies(<138 copies/mL). A negative result must be combined with clinical observations, patient history, and epidemiological information. The expected result is Negative.  Fact Sheet for Patients:  EntrepreneurPulse.com.au  Fact Sheet for Healthcare Providers:  IncredibleEmployment.be  This test is no                          t yet approved or cleared by the [place] FDA and  has been authorized  for detection and/or diagnosis of SARS-CoV-2 by FDA under an Emergency Use Authorization (EUA). This EUA will remain  in effect (meaning this test can be used) for the duration of the COVID-19 declaration under Section 564(b)(1) of the Act, 21 U.S.C.section 360bbb-3(b)(1), unless the authorization is terminated  or revoked sooner.       Influenza A by PCR 12/24/2020 NEGATIVE  NEGATIVE Final   Influenza B by PCR 12/24/2020 NEGATIVE  NEGATIVE Final   Comment: (NOTE) The Xpert Xpress SARS-CoV-2/FLU/RSV plus assay is intended as an aid in the diagnosis of influenza from Nasopharyngeal swab specimens and should not be used as a sole basis for treatment. Nasal washings and aspirates are unacceptable for Xpert Xpress SARS-CoV-2/FLU/RSV testing.  Fact Sheet for Patients: EntrepreneurPulse.com.au  Fact Sheet for Healthcare Providers: IncredibleEmployment.be  This test is not yet approved or cleared by the [place] FDA and has been authorized for detection and/or diagnosis of SARS-CoV-2 by FDA under an Emergency Use Authorization (EUA). This EUA will remain in effect (meaning this test can be used) for the duration of the COVID-19 declaration under Section 564(b)(1) of the Act, 21 U.S.C. section 360bbb-3(b)(1), unless the authorization is terminated or revoked.  Performed at [facility], [place] [address], [place], [place] [address]    SARSCOV2ONAVIRUS 2 AG 12/24/2020 NEGATIVE  NEGATIVE Final   Comment: (NOTE) SARS-CoV-2 antigen NOT DETECTED.   Negative results are presumptive.  Negative results do not preclude SARS-CoV-2 infection and should not be used as the sole basis for treatment or other patient management decisions, including infection  control decisions, particularly in the presence of clinical signs and  symptoms consistent with COVID-19, or in those who have been in contact with the virus.  Negative results must be combined  with clinical observations, patient history, and epidemiological information. The expected result is Negative.  Fact Sheet for Patients: HandmadeRecipes.com.cy  Fact Sheet for Healthcare Providers: FuneralLife.at  This test is not yet approved or cleared by the [place] FDA and  has been authorized for detection and/or diagnosis of SARS-CoV-2 by FDA under an Emergency Use Authorization (EUA).  This EUA will remain in effect (meaning this test can be used) for the duration of  the COV  ID-19 declaration under Section 564(b)(1) of the Act, 21 U.S.C. section 360bbb-3(b)(1), unless the authorization is terminated or revoked sooner.      Allergies: Codeine  PTA Medications: (Not in a hospital admission)   Medical Decision Making  Patient initially recommended for admission to [facility] for treatment and stabilization of depression and SI. Patient continue to express SI but demanding to be discharged. Patient was IVC due to safety risk. Patient now recommended for inpatient psychiatric admission due to current IVC status. Patient will be admitted to [facility] for continuous assessment while awaiting inpatient psychiatric bed to become available.      Recommendations  Based on my evaluation the patient does not appear to have an emergency medical condition.  Ophelia Shoulder, NP 12/24/20  11:19 PM

## 2020-12-24 NOTE — Progress Notes (Signed)
Kara Webster [id number] March 11, 1956 65 y.o.  Subjective:   Patient ID:  Kara Webster is a 65 y.o. (DOB [birth year]) female.  Chief Complaint:  Chief Complaint  Patient presents with   Depression   Anxiety     HPI EMILYA Webster presents to the office emergently today in crisis. She reports, "I'm very suicidal... I think I need to go somewhere... my emotions are every where... I'm having panic attacks." She reports suicidal intent and plan. "I know where and how, it's just a matter of when... I think about it all the time...but deep down in my heart I know that's not what I want to do." She reports SI for several weeks or months. "One minute I am fine, and the next minute I want to commit suicide.... I just want out." Reports calling suicide hotline this morning 30-60 minutes after feeling "fine" and making plans for the evening.   She reports mood lability. She reports that she has been angry- "my behavior has been so inappropriate." She reports that she is getting into arguments. Denies throwing or breaking things. She reports that she is physically and emotionally tired. "All I want to do is sleep." She reports that she is sleeping excessively. She reports low energy and low motivation. Diminished interest and enjoyment in things. She reports that her appetite fluctuates from not eating to eating excessively. She reports that she has been socially withdrawn.  She reports obsessive thoughts. She reports that she had anxiety earlier this week and thought she was having a heart attack. She reports rumination.   Denies excessive spending.   She reports that she has not been drinking ETOH. Has had ETOH cravings.    Has been seeing a therapist at [facility] counseling.  Review of Systems:  Review of Systems  Musculoskeletal:  Negative for gait problem.  Psychiatric/Behavioral:         Please refer to HPI   Medications: I have reviewed the patient's current medications.  Current  Outpatient Medications  Medication Sig Dispense Refill   busPIRone (BUSPAR) 15 MG tablet Take 1 tablet (15 mg total) by mouth 2 (two) times daily. 180 tablet 1   CALCIUM PO Take by mouth.     clobetasol cream (TEMOVATE) 0.05 % Thinly apply on affected skin daily x 2 weeks then wean to twice weekly prn. 30 g 0   Cyanocobalamin (VITAMIN B 12 PO) Take by mouth.     DULoxetine (CYMBALTA) 20 MG capsule Take 2 capsules (40 mg total) by mouth daily. 180 capsule 1   estradiol (ESTRACE) 0.5 MG tablet Take 0.5 tablets (0.25 mg total) by mouth daily. 45 tablet 0   gabapentin (NEURONTIN) 300 MG capsule Take 1 capsule (300 mg total) by mouth at bedtime. 90 capsule 1   lamoTRIgine (LAMICTAL) 200 MG tablet Take 1 tablet (200 mg total) by mouth daily. Take with a 100 mg tab to equal 300 mg total dose. 90 tablet 1   lithium carbonate 150 MG capsule Take 3 capsules (450 mg total) by mouth at bedtime. 270 capsule 1   progesterone (PROMETRIUM) 100 MG capsule TK ONE C PO  QHS     progesterone (PROMETRIUM) 100 MG capsule Take 1 capsule (100 mg total) by mouth at bedtime. 90 capsule 0   TURMERIC PO Take by mouth.     No current facility-administered medications for this visit.    Medication Side Effects: None  Allergies:  Allergies  Allergen Reactions   Codeine Nausea And  Vomiting    Past Medical History:  Diagnosis Date   Depression    RLS (restless legs syndrome)    Squamous cell carcinoma of skin 10/18/2020   Mid Tip of Nose (in situ)    Past Medical History, Surgical history, Social history, and Family history were reviewed and updated as appropriate.   Please see review of systems for further details on the patient's review from today.   Objective:   Physical Exam:  There were no vitals taken for this visit.  Physical Exam Constitutional:      General: She is not in acute distress. Musculoskeletal:        General: No deformity.  Neurological:     Mental Status: She is alert and  oriented to person, place, and time.     Coordination: Coordination normal.  Psychiatric:        Attention and Perception: Attention and perception normal. She does not perceive auditory or visual hallucinations.        Mood and Affect: Mood is anxious and depressed. Affect is labile and tearful. Affect is not blunt, angry or inappropriate.        Speech: Speech normal.        Behavior: Behavior is cooperative.        Thought Content: Thought content is not paranoid or delusional. Thought content includes suicidal ideation. Thought content does not include homicidal ideation. Thought content includes suicidal plan. Thought content does not include homicidal plan.        Cognition and Memory: Cognition and memory normal.        Judgment: Judgment normal.     Comments: Insight intact    Lab Review:     Component Value Date/Time   NA 140 11/11/2019 1158   K 5.3 11/11/2019 1158   CL 105 11/11/2019 1158   CO2 27 11/11/2019 1158   GLUCOSE 83 11/11/2019 1158   BUN 7 11/11/2019 1158   CREATININE 0.79 11/11/2019 1158   CALCIUM 9.7 11/11/2019 1158   PROT 6.9 11/11/2019 1158   ALBUMIN 4.2 10/27/2018 0256   AST 20 11/11/2019 1158   ALT 7 11/11/2019 1158   ALKPHOS 55 10/27/2018 0256   BILITOT 0.7 11/11/2019 1158   GFRNONAA >60 10/27/2018 0256   GFRAA >60 10/27/2018 0256       Component Value Date/Time   WBC 5.7 11/11/2019 1158   RBC 4.77 11/11/2019 1158   HGB 14.3 11/11/2019 1158   HCT 42.4 11/11/2019 1158   PLT 326 11/11/2019 1158   MCV 88.9 11/11/2019 1158   MCV 88.8 05/27/2013 1250   MCH 30.0 11/11/2019 1158   MCHC 33.7 11/11/2019 1158   RDW 12.0 11/11/2019 1158   LYMPHSABS 1.5 10/27/2018 0256   MONOABS 0.5 10/27/2018 0256   EOSABS 0.1 10/27/2018 0256   BASOSABS 0.0 10/27/2018 0256    No results found for: POCLITH, LITHIUM   No results found for: PHENYTOIN, PHENOBARB, VALPROATE, CBMZ   .res Assessment: Plan:    Pt seen for 30 minutes. Agreed that higher level of care  is recommended due to suicidal thoughts and discussed [facility] and provided pt with address and phone number for [facility]. Asked pt if she would like to call someone to transport her to [facility] and she declines and states that she can drive herself. Offered to call friend or significant other and pt declines. She contracts for safety and reports that she will drive herself to [facility] immediately  and states "the only reason I haven't done something is because of my 60 yo granddaughter." Informed pt that provider would call [place] to advise that she would be coming and pt verbalized appreciation. [facility] and communicated that pt would be arriving soon as a walk-in after reporting suicidal thoughts with plan and contracting for safety that she would drive herself to the center.    Mechele was seen today for depression and anxiety.  Diagnoses and all orders for this visit:  Depression, unspecified depression type  Generalized anxiety disorder    Please see After Visit Summary for patient specific instructions.  Future Appointments  Date Time Provider [place]  03/10/2021  3:00 PM Princess Bruins, MD [facility] None  04/15/2021  9:30 AM [name], PMHNP [facility] None    No orders of the defined types were placed in this encounter.   -------------------------------

## 2020-12-24 NOTE — ED Provider Notes (Signed)
MSE was initiated and I personally evaluated the patient and placed orders (if any) at  4:12 PM on December 24, 2020.  The patient appears stable so that the remainder of the MSE may be completed by another provider.  Behavioral Health Medical Screening Exam  Kara Webster is a 65 y.o. female who presents to [facility] with worsening depression and suicidal thoughts with a plan. SHe does not wish to disclose of this plan at this time. She was seen early toay by [name], who advised she come here for further evaluation. HPI from [name] states :  She reports, "I'm very suicidal... I think I need to go somewhere... my emotions are every where... I'm having panic attacks." She reports suicidal intent and plan. "I know where and how, it's just a matter of when... I think about it all the time...but deep down in my heart I know that's not what I want to do." She reports SI for several weeks or months. "One minute I am fine, and the next minute I want to commit suicide.... I just want out." Reports calling suicide hotline this morning 30-60 minutes after feeling "fine" and making plans for the evening.   She meets criteria for inpatient facility based crisis.   Total Time spent with patient: 30 minutes  Psychiatric Specialty Exam:  Presentation  General Appearance: Appropriate for Environment; Casual  Eye Contact:Fair  Speech:Clear and Coherent; Normal Rate  Speech Volume:Decreased  Handedness:Right   Mood and Affect  Mood:Depressed; Dysphoric  Affect:Flat; Depressed   Thought Process  Thought Processes:Coherent; Linear  Descriptions of Associations:Intact  Orientation:Full (Time, Place and Person)  Thought Content:Logical  History of Schizophrenia/Schizoaffective disorder:No data recorded Duration of Psychotic Symptoms:No data recorded Hallucinations:Hallucinations: None  Ideas of Reference:None  Suicidal Thoughts:Suicidal Thoughts:  Yes, Active SI Active Intent and/or Plan: With Intent; With Plan; With Means to [place]; With Access to Means  Homicidal Thoughts:Homicidal Thoughts: No   Sensorium  Memory:Immediate Fair; Recent Fair; Remote Fair  Judgment:Fair  Insight:Fair   Executive Functions  Concentration:Fair  Attention Span:Fair  [place]   Psychomotor Activity  Psychomotor Activity:Psychomotor Activity: Normal   Assets  Assets:Financial Resources/Insurance; Desire for Improvement; Communication Skills; Housing   Sleep  Sleep:Sleep: Fair    Physical Exam: Physical Exam Vitals and nursing note reviewed.  Constitutional:      Appearance: Normal appearance. She is normal weight.  HENT:     Head: Normocephalic.  Neurological:     General: No focal deficit present.     Mental Status: She is alert and oriented to person, place, and time. Mental status is at baseline.  Psychiatric:        Attention and Perception: She is inattentive.        Mood and Affect: Mood is anxious and depressed.        Behavior: Behavior normal. Behavior is cooperative.        Thought Content: Thought content includes suicidal ideation. Thought content includes suicidal plan.        Cognition and Memory: Cognition and memory normal.        Judgment: Judgment is impulsive.   ROS Blood pressure (!) 125/56, pulse (!) 56, temperature 98.2 F (36.8 C), temperature source Oral, resp. rate 16, height 5\' 4"  (1.626 m), weight 133 lb (60.3 kg), SpO2 99 %. Body mass index is 22.83 kg/m.  Musculoskeletal: Strength & Muscle Tone: within normal limits Gait & Station: normal  Patient leans: N/A   Recommendations:  Based on my evaluation the patient does not appear to have an emergency medical condition.  WIll admit to [facility]. WIll place labs and order COVID screening for medical clearance.   Suella Broad, FNP 12/24/2020, 4:12 PM
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
## 2020-12-24 NOTE — ED Notes (Signed)
Dash here to pick up COVID swab.  Invited patient to come back to obs unit so she would at least have a place to lay down and get something to eat.  She said "no thank you, I want to stay where I am."  Then invited her to go to assessment room that has a couch again she said "I told you I am staying here."
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
## 2020-12-24 NOTE — BH Assessment (Signed)
Pt is unable to engage in CCA due to sedation.    Vertell Novak, [employer], [facility], [facility] Triage Specialist [phone]

## 2020-12-25 DIAGNOSIS — F3160 Bipolar disorder, current episode mixed, unspecified: Secondary | ICD-10-CM | POA: Diagnosis not present

## 2020-12-25 DIAGNOSIS — R45851 Suicidal ideations: Secondary | ICD-10-CM | POA: Diagnosis not present

## 2020-12-25 NOTE — ED Notes (Signed)
Patient out of restraint chair and walked to unit. No signs of injury.  Patient sleepy but stable.  Assisted to bed - sleeping at this time

## 2020-12-25 NOTE — ED Notes (Signed)
Pt slept most of the day. No s&s of distress. Safety maintained.

## 2020-12-25 NOTE — ED Notes (Signed)
Patient assisted to bathroom 

## 2020-12-25 NOTE — ED Notes (Signed)
Pt sleeping at present, no distress noted.  Monitoring for safety. 

## 2020-12-25 NOTE — ED Notes (Signed)
Pt sleeping@this  time. No c/o of pain or distress. Will continue to monitor for safety

## 2020-12-25 NOTE — ED Notes (Signed)
Pt resting on pull out bed. No s&s of distress. Safety maintained and will continue to monitor.
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
## 2020-12-25 NOTE — ED Provider Notes (Signed)
Behavioral Health Progress Note  Date and Time: 12/25/2020 5:23 PM Name: Kara Webster MRN:  [id number]  Subjective: Kara Webster 65 year old Caucasian female continues to report suicidal ideations. "  I came here for intense counseling when will I be transferred."  Reports she is currently followed by the [facility] where she is prescribed Cymbalta gabapentin and BuSpar.  States she is unable to recall the name of any other medications.  States she feels her depression and anxiety is getting worse day by day. " It's really bad at home."   She denied previous suicide attempts, " but I know if I don't get any help I will kill  myself."  Reported inpatient admissions " a while ago."  Currently rating her depression 8 out of 10 with 10 being the worst.  Will restart home medications where appropriate.  CSW to continue seeking inpatient admission.  See chart for agitation protocol.  Support, encouragement and  reassurance was provided.  Diagnosis:  Final diagnoses:  Suicidal ideation  Bipolar I disorder, most recent episode mixed ([place])    Total Time spent with patient: 15 minutes  Past Psychiatric History:  Past Medical History:  Past Medical History:  Diagnosis Date   Depression    RLS (restless legs syndrome)    Squamous cell carcinoma of skin 10/18/2020   Mid Tip of Nose (in situ)    Past Surgical History:  Procedure Laterality Date   BLADDER SURGERY     TUBAL LIGATION     Family History:  Family History  Problem Relation Age of Onset   Cancer Mother        leukemia    Hypertension Mother    Cancer Father        lung    Alcohol abuse Father    Diabetes Sister    Hyperlipidemia Sister    Hypertension Sister    Depression Brother    Diabetes Brother    Hypertension Brother    Alcohol abuse Maternal Uncle    Alcohol abuse Paternal Aunt    Alcohol abuse Maternal Grandfather    Alcohol abuse Brother    Family Psychiatric  History:  Social History:  Social  History   Substance and Sexual Activity  Alcohol Use Not Currently     Social History   Substance and Sexual Activity  Drug Use Not Currently    Social History   Socioeconomic History   Marital status: Divorced    Spouse name: Not on file   Number of children: Not on file   Years of education: Not on file   Highest education level: Not on file  Occupational History   Not on file  Tobacco Use   Smoking status: Former    Types: Cigarettes    Quit date: 06/01/1996    Years since quitting: 24.5   Smokeless tobacco: Never  Vaping Use   Vaping Use: Never used  Substance and Sexual Activity   Alcohol use: Not Currently   Drug use: Not Currently   Sexual activity: Never    Comment: 1st intercourse- 52, partners- greater than 5,   Other Topics Concern   Not on file  Social History Narrative   Not on file   Social Determinants of Health   Financial Resource Strain: Not on file  Food Insecurity: Not on file  Transportation Needs: Not on file  Physical Activity: Not on file  Stress: Not on file  Social Connections: Not on file   SDOH:  SDOH  Screenings   Alcohol Screen: Not on file  Depression (PHQ2-9): Not on file  Financial Resource Strain: Not on file  Food Insecurity: Not on file  Housing: Not on file  Physical Activity: Not on file  Social Connections: Not on file  Stress: Not on file  Tobacco Use: Medium Risk   Smoking Tobacco Use: Former   Smokeless Tobacco Use: Never  Transportation Needs: Not on file   Additional Social History:                         Sleep: Fair  Appetite:  Fair  Current Medications:  Current Facility-Administered Medications  Medication Dose Route Frequency Provider Last Rate Last Admin   acetaminophen (TYLENOL) tablet 650 mg  650 mg Oral Q6H PRN Starkes-Perry, Gayland Curry, FNP       alum & mag hydroxide-simeth (MAALOX/MYLANTA) 200-200-20 MG/5ML suspension 30 mL  30 mL Oral Q4H PRN Starkes-Perry, Gayland Curry, FNP        busPIRone (BUSPAR) tablet 15 mg  15 mg Oral BID Ajibola, Ene A, NP   15 mg at 12/25/20 1706   DULoxetine (CYMBALTA) DR capsule 40 mg  40 mg Oral Daily Ajibola, Ene A, NP   40 mg at 12/25/20 1706   gabapentin (NEURONTIN) capsule 300 mg  300 mg Oral QHS Ajibola, Ene A, NP       hydrOXYzine (ATARAX/VISTARIL) tablet 25 mg  25 mg Oral TID PRN Starkes-Perry, Gayland Curry, FNP       lamoTRIgine (LAMICTAL) tablet 300 mg  300 mg Oral Daily Ajibola, Ene A, NP   300 mg at 12/25/20 1706   OLANZapine zydis (ZYPREXA) disintegrating tablet 5 mg  5 mg Oral Once Ajibola, Ene A, NP       And   LORazepam (ATIVAN) tablet 1 mg  1 mg Oral PRN Ajibola, Ene A, NP       And   ziprasidone (GEODON) injection 20 mg  20 mg Intramuscular PRN Ajibola, Ene A, NP       magnesium hydroxide (MILK OF MAGNESIA) suspension 30 mL  30 mL Oral Daily PRN Starkes-Perry, Gayland Curry, FNP       traZODone (DESYREL) tablet 50 mg  50 mg Oral QHS PRN Ajibola, Ene A, NP       Current Outpatient Medications  Medication Sig Dispense Refill   calcium carbonate (OS-CAL - DOSED IN MG OF ELEMENTAL CALCIUM) 1250 (500 Ca) MG tablet Take 1 tablet by mouth daily with breakfast.     cholecalciferol (VITAMIN D3) 25 MCG (1000 UNIT) tablet Take 1,000 Units by mouth daily.     vitamin B-12 (CYANOCOBALAMIN) 500 MCG tablet Take 500 mcg by mouth daily.     busPIRone (BUSPAR) 15 MG tablet Take 1 tablet (15 mg total) by mouth 2 (two) times daily. 180 tablet 1   DULoxetine (CYMBALTA) 20 MG capsule Take 2 capsules (40 mg total) by mouth daily. 180 capsule 1   estradiol (ESTRACE) 0.5 MG tablet Take 0.5 tablets (0.25 mg total) by mouth daily. 45 tablet 0   gabapentin (NEURONTIN) 300 MG capsule Take 1 capsule (300 mg total) by mouth at bedtime. 90 capsule 1   lamoTRIgine (LAMICTAL) 200 MG tablet Take 1 tablet (200 mg total) by mouth daily. Take with a 100 mg tab to equal 300 mg total dose. 90 tablet 1   lithium carbonate 150 MG capsule Take 3 capsules (450 mg total) by mouth  at bedtime. 270 capsule 1  progesterone (PROMETRIUM) 100 MG capsule Take 1 capsule (100 mg total) by mouth at bedtime. 90 capsule 0   TURMERIC PO Take 1 capsule by mouth daily.      Labs  Lab Results:  Admission on 12/24/2020  Component Date Value Ref Range Status   SARS Coronavirus 2 by RT PCR 12/24/2020 NEGATIVE  NEGATIVE Final   Comment: (NOTE) SARS-CoV-2 target nucleic acids are NOT DETECTED.  The SARS-CoV-2 RNA is generally detectable in upper respiratory specimens during the acute phase of infection. The lowest concentration of SARS-CoV-2 viral copies this assay can detect is 138 copies/mL. A negative result does not preclude SARS-Cov-2 infection and should not be used as the sole basis for treatment or other patient management decisions. A negative result may occur with  improper specimen collection/handling, submission of specimen other than nasopharyngeal swab, presence of viral mutation(s) within the areas targeted by this assay, and inadequate number of viral copies(<138 copies/mL). A negative result must be combined with clinical observations, patient history, and epidemiological information. The expected result is Negative.  Fact Sheet for Patients:  EntrepreneurPulse.com.au  Fact Sheet for Healthcare Providers:  IncredibleEmployment.be  This test is no                          t yet approved or cleared by the [place] FDA and  has been authorized for detection and/or diagnosis of SARS-CoV-2 by FDA under an Emergency Use Authorization (EUA). This EUA will remain  in effect (meaning this test can be used) for the duration of the COVID-19 declaration under Section 564(b)(1) of the Act, 21 U.S.C.section 360bbb-3(b)(1), unless the authorization is terminated  or revoked sooner.       Influenza A by PCR 12/24/2020 NEGATIVE  NEGATIVE Final   Influenza B by PCR 12/24/2020 NEGATIVE  NEGATIVE Final   Comment: (NOTE) The Xpert  Xpress SARS-CoV-2/FLU/RSV plus assay is intended as an aid in the diagnosis of influenza from Nasopharyngeal swab specimens and should not be used as a sole basis for treatment. Nasal washings and aspirates are unacceptable for Xpert Xpress SARS-CoV-2/FLU/RSV testing.  Fact Sheet for Patients: EntrepreneurPulse.com.au  Fact Sheet for Healthcare Providers: IncredibleEmployment.be  This test is not yet approved or cleared by the [place] FDA and has been authorized for detection and/or diagnosis of SARS-CoV-2 by FDA under an Emergency Use Authorization (EUA). This EUA will remain in effect (meaning this test can be used) for the duration of the COVID-19 declaration under Section 564(b)(1) of the Act, 21 U.S.C. section 360bbb-3(b)(1), unless the authorization is terminated or revoked.  Performed at [facility], [place] [address] [place], [place] [address]    SARSCOV2ONAVIRUS 2 AG 12/24/2020 NEGATIVE  NEGATIVE Final   Comment: (NOTE) SARS-CoV-2 antigen NOT DETECTED.   Negative results are presumptive.  Negative results do not preclude SARS-CoV-2 infection and should not be used as the sole basis for treatment or other patient management decisions, including infection  control decisions, particularly in the presence of clinical signs and  symptoms consistent with COVID-19, or in those who have been in contact with the virus.  Negative results must be combined with clinical observations, patient history, and epidemiological information. The expected result is Negative.  Fact Sheet for Patients: HandmadeRecipes.com.cy  Fact Sheet for Healthcare Providers: FuneralLife.at  This test is not yet approved or cleared by the [place] FDA and  has been authorized for detection and/or diagnosis of SARS-CoV-2 by FDA under an  Emergency Use Authorization (EUA).  This EUA will remain in effect  (meaning this test can be used) for the duration of  the COV                          ID-19 declaration under Section 564(b)(1) of the Act, 21 U.S.C. section 360bbb-3(b)(1), unless the authorization is terminated or revoked sooner.      Blood Alcohol level:  Lab Results  Component Value Date   ETH <10 10/27/2018   [facility]  08/11/2009    <5        LOWEST DETECTABLE LIMIT FOR SERUM ALCOHOL IS 5 mg/dL FOR MEDICAL PURPOSES ONLY    Metabolic Disorder Labs: No results found for: HGBA1C, MPG No results found for: PROLACTIN Lab Results  Component Value Date   CHOL 219 (H) 11/11/2019   TRIG 60 11/11/2019   HDL 84 11/11/2019   CHOLHDL 2.6 11/11/2019   LDLCALC 120 (H) 11/11/2019   LDLCALC 129 (H) 06/25/2018    Therapeutic Lab Levels: No results found for: LITHIUM No results found for: VALPROATE No components found for:  CBMZ  Physical Findings   Flowsheet Row ED from 12/24/2020 in [facility]        Musculoskeletal  Strength & Muscle Tone: within normal limits Gait & Station: normal Patient leans: N/A  Psychiatric Specialty Exam  Presentation  General Appearance: Appropriate for Environment; Casual  Eye Contact:Fair  Speech:Clear and Coherent; Normal Rate  Speech Volume:Decreased  Handedness:Right   Mood and Affect  Mood:Depressed; Dysphoric  Affect:Flat; Depressed   Thought Process  Thought Processes:Coherent; Linear  Descriptions of Associations:Intact  Orientation:Full (Time, Place and Person)  Thought Content:Logical     Hallucinations:Hallucinations: None  Ideas of Reference:None  Suicidal Thoughts:Suicidal Thoughts: Yes, Active SI Active Intent and/or Plan: With Intent; With Plan; With Means to [place]; With Access to Means  Homicidal Thoughts:Homicidal Thoughts: No   Sensorium  Memory:Immediate Fair; Recent Fair; Remote Fair  Judgment:Fair  Insight:Fair   Executive  Functions  Concentration:Fair  Attention Span:Fair  [place]   Psychomotor Activity  Psychomotor Activity:Psychomotor Activity: Normal   Assets  Assets:Financial Resources/Insurance; Desire for Improvement; Communication Skills; Housing   Sleep  Sleep:Sleep: Fair   No data recorded  Physical Exam  Physical Exam ROS Blood pressure (!) 144/61, pulse 98, temperature 98.2 F (36.8 C), temperature source Oral, resp. rate 20, height 5\' 4"  (1.626 m), weight 133 lb (60.3 kg), SpO2 99 %. Body mass index is 22.83 kg/m.  Treatment Plan Summary: Daily contact with patient to assess and evaluate symptoms and progress in treatment and Medication management   Bipolar Disorder:  Major Depression  Disorder:   Reviewed patient is currently prescribed  Cymbalta 20 mg p.o. daily Gabapentin 300 mg p.o. twice daily Lamictal 300 mg p.o. nightly  Continue seeking inpatient admission  Derrill [place], NP 12/25/2020 5:23 PM

## 2020-12-25 NOTE — ED Notes (Signed)
As per day shift staff, RN [place], Pt refused blood work to be obtained.

## 2020-12-25 NOTE — ED Provider Notes (Signed)
Provider was contacted by patient's daughter Kyria Bumgardner, [phone]) inquiring about the patient's current situation. Provider informed patient that he would need to confirm with the patient permission for her information to be disclosed to her.  Provider was given permission by the patient to discuss her whereabouts with her daughter. Provider contacted patient's daughter and informed her that the patient is currently IVC and maybe being transported to [facility]. Provider informed patient that he would contact her prior to end of shift to inform her of any updates regarding the patient's course of admission.

## 2020-12-25 NOTE — ED Notes (Signed)
Patient in restraint chair.  No evidence of injury to patient.  No c/o pain or discomfort.  IM injections given

## 2020-12-25 NOTE — ED Notes (Signed)
Meal provided and Pt accepted PO meds w/o difficulty. Pt requested to see provider. Informed providers per secure chat.

## 2020-12-25 NOTE — Discharge Instructions (Addendum)
Take all medications as prescribed. Keep all follow-up appointments as scheduled.  Do not consume alcohol or use illegal drugs while on prescription medications. Report any adverse effects from your medications to your primary care provider promptly.  In the event of recurrent symptoms or worsening symptoms, call 911, a crisis hotline, or go to the nearest emergency department for evaluation.   

## 2020-12-25 NOTE — ED Notes (Signed)
Patient resting currently in chair.  Wrist straps loosened and should straps undone.

## 2020-12-26 NOTE — ED Notes (Signed)
Pt resting at present, no distress noted.  Monitoring for safety. 

## 2020-12-26 NOTE — ED Notes (Signed)
Pt sleeping@this time. Breathing even and unlabored. Will continue to monitor for safety 

## 2020-12-26 NOTE — Progress Notes (Signed)
Per [place], pt has been accepted to [facility]. Accepting provider is Raji Thotajura. Patient can arrive anytime. Number for report is [phone].   Glennie [place], MSW, LCSW-A Phone: [phone] Disposition/TOC

## 2020-12-26 NOTE — ED Notes (Signed)
Morning meds offered, Pt refused at this time. Will re-offer later. Pt currently resting on pull out bed and crying after trying to use the phone. Safety maintained and will continue to monitor.

## 2020-12-26 NOTE — ED Provider Notes (Addendum)
FBC/OBS ASAP Discharge Summary  Date and Time: 12/26/2020 5:12 PM  Name: Kara Webster  MRN:  [id number]   Discharge Diagnoses:  Final diagnoses:  Suicidal ideation  Bipolar I disorder, most recent episode mixed [facility])    Note by CSW-  Per Raven, pt has been accepted to [facility]. Accepting provider is Raji Thotajura. Patient can arrive anytime. Number for report is [phone].  Stay Summary: Patient to be transferred to [facility].  EMTALA completed.  Total Time spent with patient: 15 minutes  Past Psychiatric History:  Past Medical History:  Past Medical History:  Diagnosis Date   Depression    RLS (restless legs syndrome)    Squamous cell carcinoma of skin 10/18/2020   Mid Tip of Nose (in situ)    Past Surgical History:  Procedure Laterality Date   BLADDER SURGERY     TUBAL LIGATION     Family History:  Family History  Problem Relation Age of Onset   Cancer Mother        leukemia    Hypertension Mother    Cancer Father        lung    Alcohol abuse Father    Diabetes Sister    Hyperlipidemia Sister    Hypertension Sister    Depression Brother    Diabetes Brother    Hypertension Brother    Alcohol abuse Maternal Uncle    Alcohol abuse Paternal Aunt    Alcohol abuse Maternal Grandfather    Alcohol abuse Brother    Family Psychiatric History: Social History:  Social History   Substance and Sexual Activity  Alcohol Use Not Currently     Social History   Substance and Sexual Activity  Drug Use Not Currently    Social History   Socioeconomic History   Marital status: Divorced    Spouse name: Not on file   Number of children: Not on file   Years of education: Not on file   Highest education level: Not on file  Occupational History   Not on file  Tobacco Use   Smoking status: Former    Types: Cigarettes    Quit date: 06/01/1996    Years since quitting: 24.5   Smokeless tobacco: Never  Vaping Use   Vaping Use:  Never used  Substance and Sexual Activity   Alcohol use: Not Currently   Drug use: Not Currently   Sexual activity: Never    Comment: 1st intercourse- 67, partners- greater than 5,   Other Topics Concern   Not on file  Social History Narrative   Not on file   Social Determinants of Health   Financial Resource Strain: Not on file  Food Insecurity: Not on file  Transportation Needs: Not on file  Physical Activity: Not on file  Stress: Not on file  Social Connections: Not on file   SDOH:  SDOH Screenings   Alcohol Screen: Not on file  Depression (PHQ2-9): Not on file  Financial Resource Strain: Not on file  Food Insecurity: Not on file  Housing: Not on file  Physical Activity: Not on file  Social Connections: Not on file  Stress: Not on file  Tobacco Use: Medium Risk   Smoking Tobacco Use: Former   Smokeless Tobacco Use: Never  Transportation Needs: Not on file    Tobacco Cessation:  N/A, patient does not currently use tobacco products  Current Medications:  Current Facility-Administered Medications  Medication Dose Route Frequency Provider Last Rate Last  Admin   acetaminophen (TYLENOL) tablet 650 mg  650 mg Oral Q6H PRN Suella Broad, FNP   650 mg at 12/26/20 1701   alum & mag hydroxide-simeth (MAALOX/MYLANTA) 200-200-20 MG/5ML suspension 30 mL  30 mL Oral Q4H PRN Starkes-Perry, Gayland Curry, FNP       busPIRone (BUSPAR) tablet 15 mg  15 mg Oral BID Ajibola, Ene A, NP   15 mg at 12/26/20 1002   DULoxetine (CYMBALTA) DR capsule 40 mg  40 mg Oral Daily Ajibola, Ene A, NP   40 mg at 12/26/20 1002   gabapentin (NEURONTIN) capsule 300 mg  300 mg Oral QHS Ajibola, Ene A, NP   300 mg at 12/25/20 2251   hydrOXYzine (ATARAX/VISTARIL) tablet 25 mg  25 mg Oral TID PRN Suella Broad, FNP       lamoTRIgine (LAMICTAL) tablet 300 mg  300 mg Oral Daily Ajibola, Ene A, NP   300 mg at 12/26/20 1002   OLANZapine zydis (ZYPREXA) disintegrating tablet 5 mg  5 mg Oral Once  Ajibola, Ene A, NP       And   LORazepam (ATIVAN) tablet 1 mg  1 mg Oral PRN Ajibola, Ene A, NP       And   ziprasidone (GEODON) injection 20 mg  20 mg Intramuscular PRN Ajibola, Ene A, NP       magnesium hydroxide (MILK OF MAGNESIA) suspension 30 mL  30 mL Oral Daily PRN Starkes-Perry, Gayland Curry, FNP       traZODone (DESYREL) tablet 50 mg  50 mg Oral QHS PRN Ajibola, Ene A, NP       Current Outpatient Medications  Medication Sig Dispense Refill   calcium carbonate (OS-CAL - DOSED IN MG OF ELEMENTAL CALCIUM) 1250 (500 Ca) MG tablet Take 1 tablet by mouth daily with breakfast.     cholecalciferol (VITAMIN D3) 25 MCG (1000 UNIT) tablet Take 1,000 Units by mouth daily.     vitamin B-12 (CYANOCOBALAMIN) 500 MCG tablet Take 500 mcg by mouth daily.     busPIRone (BUSPAR) 15 MG tablet Take 1 tablet (15 mg total) by mouth 2 (two) times daily. 180 tablet 1   DULoxetine (CYMBALTA) 20 MG capsule Take 2 capsules (40 mg total) by mouth daily. 180 capsule 1   estradiol (ESTRACE) 0.5 MG tablet Take 0.5 tablets (0.25 mg total) by mouth daily. 45 tablet 0   gabapentin (NEURONTIN) 300 MG capsule Take 1 capsule (300 mg total) by mouth at bedtime. 90 capsule 1   lamoTRIgine (LAMICTAL) 200 MG tablet Take 1 tablet (200 mg total) by mouth daily. Take with a 100 mg tab to equal 300 mg total dose. 90 tablet 1   lithium carbonate 150 MG capsule Take 3 capsules (450 mg total) by mouth at bedtime. 270 capsule 1   progesterone (PROMETRIUM) 100 MG capsule Take 1 capsule (100 mg total) by mouth at bedtime. 90 capsule 0   TURMERIC PO Take 1 capsule by mouth daily.      PTA Medications: (Not in a hospital admission)   Musculoskeletal  Strength & Muscle Tone: within normal limits Gait & Station: normal Patient leans: N/A  Psychiatric Specialty Exam  Presentation  General Appearance: Appropriate for Environment; Casual  Eye Contact:Fair  Speech:Clear and Coherent; Normal Rate  Speech  Volume:Decreased  Handedness:Right   Mood and Affect  Mood:Depressed; Dysphoric  Affect:Flat; Depressed   Thought Process  Thought Processes:Coherent; Linear  Descriptions of Associations:Intact  Orientation:Full (Time, Place and Person)  Thought  Content:Logical     Hallucinations:No data recorded Ideas of Reference:None  Suicidal Thoughts:No data recorded Homicidal Thoughts:No data recorded  Sensorium  Memory:Immediate Fair; Recent Fair; Remote Fair  Judgment:Fair  Insight:Fair   Executive Functions  Concentration:Fair  Attention Span:Fair  [place]   Psychomotor Activity  Psychomotor Activity: No data recorded  Assets  Assets:Financial Resources/Insurance; Desire for Improvement; Communication Skills; Housing   Sleep  Sleep: No data recorded  No data recorded  Physical Exam  Physical Exam Vitals and nursing note reviewed.  Cardiovascular:     Rate and Rhythm: Normal rate.  Neurological:     Mental Status: She is alert and oriented to person, place, and time.  Psychiatric:        Attention and Perception: Attention normal.        Mood and Affect: Mood normal.        Speech: Speech normal.        Behavior: Behavior is agitated and withdrawn.        Thought Content: Thought content normal.        Cognition and Memory: Cognition normal.        Judgment: Judgment normal.   Review of Systems  HENT: Negative.    Cardiovascular: Negative.   Skin: Negative.   Endo/Heme/Allergies: Negative.   Psychiatric/Behavioral:  Positive for depression and suicidal ideas. The patient is nervous/anxious and has insomnia.   All other systems reviewed and are negative. Blood pressure 131/75, pulse 83, temperature 97.6 F (36.4 C), resp. rate 16, height 5\' 4"  (1.626 m), weight 133 lb (60.3 kg), SpO2 98 %. Body mass index is 22.83 kg/m.  Demographic Factors:  Age 64 or older  Loss Factors: Loss of significant  relationship  Historical Factors: Prior suicide attempts and Impulsivity  Risk Reduction Factors:   Positive social support and Positive therapeutic relationship  Continued Clinical Symptoms:  Severe Anxiety and/or Agitation  Cognitive Features That Contribute To Risk:  Closed-mindedness    Suicide Risk:  Minimal: No identifiable suicidal ideation.  Patients presenting with no risk factors but with morbid ruminations; may be classified as minimal risk based on the severity of the depressive symptoms  Plan Of Care/Follow-up recommendations:  Activity:  as tolerated Diet:  heart healthy  Disposition: Per Raven, pt has been accepted to [facility]. Accepting provider is Raji Thotajura. Patient can arrive anytime. Number for report is [phone].   Take all medications as prescribed. Keep all follow-up appointments as scheduled.  Do not consume alcohol or use illegal drugs while on prescription medications. Report any adverse effects from your medications to your primary care provider promptly.  In the event of recurrent symptoms or worsening symptoms, call 911, a crisis hotline, or go to the nearest emergency department for evaluation.    Derrill Center, NP 12/26/2020, 5:12 PM

## 2020-12-26 NOTE — ED Notes (Signed)
Pt stated that she is not suicidal. She just wants to go home.

## 2020-12-26 NOTE — ED Notes (Signed)
Pt accepted morning meds, refused a meal when offered, requested to go home. Informed provider that pt was ready to go home via secure chat. Safety maintained and will continue to monitor.

## 2020-12-26 NOTE — ED Notes (Signed)
Report called to [place], spoke to [facility] at [phone]. Voicemail left for [facility] at [phone].

## 2020-12-26 NOTE — ED Notes (Signed)
Pt calm and cooperative@this  time. Breathing even and unlabored. Will continue to monitor for safety

## 2020-12-26 NOTE — ED Provider Notes (Signed)
Behavioral Health Progress Note  Date and Time: 12/26/2020 2:27 PM Name: Kara Webster MRN:  [id number]  Subjective:  Kara Webster reported " I feel like I am going crazy in here. Do you know when I will have a bed?"   Evaluation: [name] observed resting in bed.  She continues to endorsed depression.  Currently denying suicidal or homicidal ideations.  Denies auditory or visual hallucinations.  Patient is aware she is under involuntary commitment and staff is continue to seek for inpatient admission.  NP followed up with social worker regarding inpatient admission.  Patient has been faxed out to multiple facilities.  Staff reports patient has been taking medication as indicated.  We will continue to monitor for safety.  Support,  encouragement and reassurance was provided.  Diagnosis:  Final diagnoses:  Suicidal ideation  Bipolar I disorder, most recent episode mixed ([place])    Total Time spent with patient: 15 minutes  Past Psychiatric History:  Past Medical History:  Past Medical History:  Diagnosis Date   Depression    RLS (restless legs syndrome)    Squamous cell carcinoma of skin 10/18/2020   Mid Tip of Nose (in situ)    Past Surgical History:  Procedure Laterality Date   BLADDER SURGERY     TUBAL LIGATION     Family History:  Family History  Problem Relation Age of Onset   Cancer Mother        leukemia    Hypertension Mother    Cancer Father        lung    Alcohol abuse Father    Diabetes Sister    Hyperlipidemia Sister    Hypertension Sister    Depression Brother    Diabetes Brother    Hypertension Brother    Alcohol abuse Maternal Uncle    Alcohol abuse Paternal Aunt    Alcohol abuse Maternal Grandfather    Alcohol abuse Brother    Family Psychiatric  History:  Social History:  Social History   Substance and Sexual Activity  Alcohol Use Not Currently     Social History   Substance and Sexual Activity  Drug Use Not Currently    Social History    Socioeconomic History   Marital status: Divorced    Spouse name: Not on file   Number of children: Not on file   Years of education: Not on file   Highest education level: Not on file  Occupational History   Not on file  Tobacco Use   Smoking status: Former    Types: Cigarettes    Quit date: 06/01/1996    Years since quitting: 24.5   Smokeless tobacco: Never  Vaping Use   Vaping Use: Never used  Substance and Sexual Activity   Alcohol use: Not Currently   Drug use: Not Currently   Sexual activity: Never    Comment: 1st intercourse- 50, partners- greater than 5,   Other Topics Concern   Not on file  Social History Narrative   Not on file   Social Determinants of Health   Financial Resource Strain: Not on file  Food Insecurity: Not on file  Transportation Needs: Not on file  Physical Activity: Not on file  Stress: Not on file  Social Connections: Not on file   SDOH:  SDOH Screenings   Alcohol Screen: Not on file  Depression (ZRA0-7): Not on file  Financial Resource Strain: Not on file  Food Insecurity: Not on file  Housing: Not on file  Physical Activity:  Not on file  Social Connections: Not on file  Stress: Not on file  Tobacco Use: Medium Risk   Smoking Tobacco Use: Former   Smokeless Tobacco Use: Never  Transportation Needs: Not on file   Additional Social History:                         Sleep: Good  Appetite:  Fair  Current Medications:  Current Facility-Administered Medications  Medication Dose Route Frequency Provider Last Rate Last Admin   acetaminophen (TYLENOL) tablet 650 mg  650 mg Oral Q6H PRN Starkes-Perry, Gayland Curry, FNP       alum & mag hydroxide-simeth (MAALOX/MYLANTA) 200-200-20 MG/5ML suspension 30 mL  30 mL Oral Q4H PRN Starkes-Perry, Gayland Curry, FNP       busPIRone (BUSPAR) tablet 15 mg  15 mg Oral BID Ajibola, Ene A, NP   15 mg at 12/26/20 1002   DULoxetine (CYMBALTA) DR capsule 40 mg  40 mg Oral Daily Ajibola, Ene A, NP   40  mg at 12/26/20 1002   gabapentin (NEURONTIN) capsule 300 mg  300 mg Oral QHS Ajibola, Ene A, NP   300 mg at 12/25/20 2251   hydrOXYzine (ATARAX/VISTARIL) tablet 25 mg  25 mg Oral TID PRN Suella Broad, FNP       lamoTRIgine (LAMICTAL) tablet 300 mg  300 mg Oral Daily Ajibola, Ene A, NP   300 mg at 12/26/20 1002   OLANZapine zydis (ZYPREXA) disintegrating tablet 5 mg  5 mg Oral Once Ajibola, Ene A, NP       And   LORazepam (ATIVAN) tablet 1 mg  1 mg Oral PRN Ajibola, Ene A, NP       And   ziprasidone (GEODON) injection 20 mg  20 mg Intramuscular PRN Ajibola, Ene A, NP       magnesium hydroxide (MILK OF MAGNESIA) suspension 30 mL  30 mL Oral Daily PRN Starkes-Perry, Gayland Curry, FNP       traZODone (DESYREL) tablet 50 mg  50 mg Oral QHS PRN Ajibola, Ene A, NP       Current Outpatient Medications  Medication Sig Dispense Refill   calcium carbonate (OS-CAL - DOSED IN MG OF ELEMENTAL CALCIUM) 1250 (500 Ca) MG tablet Take 1 tablet by mouth daily with breakfast.     cholecalciferol (VITAMIN D3) 25 MCG (1000 UNIT) tablet Take 1,000 Units by mouth daily.     vitamin B-12 (CYANOCOBALAMIN) 500 MCG tablet Take 500 mcg by mouth daily.     busPIRone (BUSPAR) 15 MG tablet Take 1 tablet (15 mg total) by mouth 2 (two) times daily. 180 tablet 1   DULoxetine (CYMBALTA) 20 MG capsule Take 2 capsules (40 mg total) by mouth daily. 180 capsule 1   estradiol (ESTRACE) 0.5 MG tablet Take 0.5 tablets (0.25 mg total) by mouth daily. 45 tablet 0   gabapentin (NEURONTIN) 300 MG capsule Take 1 capsule (300 mg total) by mouth at bedtime. 90 capsule 1   lamoTRIgine (LAMICTAL) 200 MG tablet Take 1 tablet (200 mg total) by mouth daily. Take with a 100 mg tab to equal 300 mg total dose. 90 tablet 1   lithium carbonate 150 MG capsule Take 3 capsules (450 mg total) by mouth at bedtime. 270 capsule 1   progesterone (PROMETRIUM) 100 MG capsule Take 1 capsule (100 mg total) by mouth at bedtime. 90 capsule 0   TURMERIC PO Take  1 capsule by mouth daily.  Labs  Lab Results:  Admission on 12/24/2020  Component Date Value Ref Range Status   SARS Coronavirus 2 by RT PCR 12/24/2020 NEGATIVE  NEGATIVE Final   Comment: (NOTE) SARS-CoV-2 target nucleic acids are NOT DETECTED.  The SARS-CoV-2 RNA is generally detectable in upper respiratory specimens during the acute phase of infection. The lowest concentration of SARS-CoV-2 viral copies this assay can detect is 138 copies/mL. A negative result does not preclude SARS-Cov-2 infection and should not be used as the sole basis for treatment or other patient management decisions. A negative result may occur with  improper specimen collection/handling, submission of specimen other than nasopharyngeal swab, presence of viral mutation(s) within the areas targeted by this assay, and inadequate number of viral copies(<138 copies/mL). A negative result must be combined with clinical observations, patient history, and epidemiological information. The expected result is Negative.  Fact Sheet for Patients:  EntrepreneurPulse.com.au  Fact Sheet for Healthcare Providers:  IncredibleEmployment.be  This test is no                          t yet approved or cleared by the [place] FDA and  has been authorized for detection and/or diagnosis of SARS-CoV-2 by FDA under an Emergency Use Authorization (EUA). This EUA will remain  in effect (meaning this test can be used) for the duration of the COVID-19 declaration under Section 564(b)(1) of the Act, 21 U.S.C.section 360bbb-3(b)(1), unless the authorization is terminated  or revoked sooner.       Influenza A by PCR 12/24/2020 NEGATIVE  NEGATIVE Final   Influenza B by PCR 12/24/2020 NEGATIVE  NEGATIVE Final   Comment: (NOTE) The Xpert Xpress SARS-CoV-2/FLU/RSV plus assay is intended as an aid in the diagnosis of influenza from Nasopharyngeal swab specimens and should not be used as a  sole basis for treatment. Nasal washings and aspirates are unacceptable for Xpert Xpress SARS-CoV-2/FLU/RSV testing.  Fact Sheet for Patients: EntrepreneurPulse.com.au  Fact Sheet for Healthcare Providers: IncredibleEmployment.be  This test is not yet approved or cleared by the [place] FDA and has been authorized for detection and/or diagnosis of SARS-CoV-2 by FDA under an Emergency Use Authorization (EUA). This EUA will remain in effect (meaning this test can be used) for the duration of the COVID-19 declaration under Section 564(b)(1) of the Act, 21 U.S.C. section 360bbb-3(b)(1), unless the authorization is terminated or revoked.  Performed at [facility], [place] [address], [place], [place] [address]    SARSCOV2ONAVIRUS 2 AG 12/24/2020 NEGATIVE  NEGATIVE Final   Comment: (NOTE) SARS-CoV-2 antigen NOT DETECTED.   Negative results are presumptive.  Negative results do not preclude SARS-CoV-2 infection and should not be used as the sole basis for treatment or other patient management decisions, including infection  control decisions, particularly in the presence of clinical signs and  symptoms consistent with COVID-19, or in those who have been in contact with the virus.  Negative results must be combined with clinical observations, patient history, and epidemiological information. The expected result is Negative.  Fact Sheet for Patients: HandmadeRecipes.com.cy  Fact Sheet for Healthcare Providers: FuneralLife.at  This test is not yet approved or cleared by the [place] FDA and  has been authorized for detection and/or diagnosis of SARS-CoV-2 by FDA under an Emergency Use Authorization (EUA).  This EUA will remain in effect (meaning this test can be used) for the duration of  the COV  ID-19 declaration under Section 564(b)(1) of the Act, 21 U.S.C.  section 360bbb-3(b)(1), unless the authorization is terminated or revoked sooner.      Blood Alcohol level:  Lab Results  Component Value Date   ETH <10 10/27/2018   [facility]  08/11/2009    <5        LOWEST DETECTABLE LIMIT FOR SERUM ALCOHOL IS 5 mg/dL FOR MEDICAL PURPOSES ONLY    Metabolic Disorder Labs: No results found for: HGBA1C, MPG No results found for: PROLACTIN Lab Results  Component Value Date   CHOL 219 (H) 11/11/2019   TRIG 60 11/11/2019   HDL 84 11/11/2019   CHOLHDL 2.6 11/11/2019   LDLCALC 120 (H) 11/11/2019   LDLCALC 129 (H) 06/25/2018    Therapeutic Lab Levels: No results found for: LITHIUM No results found for: VALPROATE No components found for:  CBMZ  Physical Findings   Flowsheet Row ED from 12/24/2020 in [place] High Risk        Musculoskeletal  Strength & Muscle Tone: within normal limits Gait & Station: normal Patient leans: N/A  Psychiatric Specialty Exam  Presentation  General Appearance: Appropriate for Environment; Casual  Eye Contact:Fair  Speech:Clear and Coherent; Normal Rate  Speech Volume:Decreased  Handedness:Right   Mood and Affect  Mood:Depressed; Dysphoric  Affect:Flat; Depressed   Thought Process  Thought Processes:Coherent; Linear  Descriptions of Associations:Intact  Orientation:Full (Time, Place and Person)  Thought Content:Logical     Hallucinations:No data recorded Ideas of Reference:None  Suicidal Thoughts:No data recorded Homicidal Thoughts:No data recorded  Sensorium  Memory:Immediate Fair; Recent Fair; Remote Fair  Judgment:Fair  Insight:Fair   Executive Functions  Concentration:Fair  Attention Span:Fair  [place]   Psychomotor Activity  Psychomotor Activity: No data recorded  Assets  Assets:Financial Resources/Insurance; Desire for Improvement; Communication Skills;  Housing   Sleep  Sleep: No data recorded  No data recorded  Physical Exam  Physical Exam Vitals and nursing note reviewed.  Cardiovascular:     Rate and Rhythm: Normal rate and regular rhythm.  Psychiatric:        Mood and Affect: Mood normal.        Thought Content: Thought content normal.   Review of Systems  Psychiatric/Behavioral:  Positive for depression and suicidal ideas. The patient is nervous/anxious.   All other systems reviewed and are negative. Blood pressure 131/75, pulse 83, temperature 97.6 F (36.4 C), temperature source Oral, resp. rate 16, height 5\' 4"  (1.626 m), weight 133 lb (60.3 kg), SpO2 98 %. Body mass index is 22.83 kg/m.  Treatment Plan Summary: Daily contact with patient to assess and evaluate symptoms and progress in treatment and Medication management  Bipolar Disorder:  Major Depression  Disorder:    Reviewed patient is currently prescribed Continue Cymbalta 20 mg p.o. daily Continue Gabapentin 300 mg p.o. twice daily Continue Lamictal 300 mg p.o. nightly   CSW tp Continue seeking inpatient admission  Derrill [place], NP 12/26/2020 2:27 PM

## 2020-12-26 NOTE — Progress Notes (Signed)
Per Kara Webster, patient meets criteria for inpatient treatment. There are no available or appropriate beds at [facility] today. CSW faxed referrals to the following facilities for review:  [facility]  Pending - No Request Sent N/A [address] [place] [address] [phone] --  [place] No Request Sent N/A [address], [place] [address] [phone] --  [place]  Pending - No Request Sent N/A [address] [place] [address] [phone] --  [facility]  Pending - No Request Sent N/A [address] [place] [address] [phone] --  [facility] [address] [place] [address] [phone] --  [place]  Pending - No Request Sent N/A [address] [place] [address] [phone] --  [facility]  Pending - No Request Sent N/A [address] [place], [place] [address] [phone] --  [facility]  Pending - No Request Sent N/A [address] [place] [address] [place] --  [facility]  Pending - No Request Sent N/A [address], [place] [address] [facility]  Pending - No Request Sent N/A [address] [place] [address] [phone] --  [facility]  Pending - No Request Sent N/A [address] [place] [address] [phone] --  [place]  Pending - No Request Sent N/A [address] [place] [address] [phone] --  [facility]  Pending - No Request Sent N/A [place], [place] [address] [place] --  [place]  Pending - No Request Sent N/A [address] [place] [address] [phone] --  [facility]  Pending - No Request Sent N/A [address], [place] [address] [phone] --  [facility]  Pending - No Request Sent N/A [address] [place] [address] [phone] --  [facility]  Pending - No Request Sent N/A [address], [place] [address] [phone] --  [facility]  Pending - No Request Sent N/A [address] [place]  [address] [phone] --    TTS will continue to seek bed placement.  Kara Webster, MSW, [place], LCAS-A Phone: [phone] Disposition/TOC

## 2020-12-26 NOTE — BH Assessment (Signed)
Faxed IVC paperwork to [employer].

## 2020-12-27 NOTE — ED Notes (Signed)
Pt sleeping@this time. Breathing even and unlabored. Will continue to monitor for safety 

## 2020-12-27 NOTE — ED Notes (Signed)
Report called to Ms. Quentin Cornwall at [facility]. Spoke to [employer] regarding transportation services.

## 2020-12-27 NOTE — ED Notes (Signed)
Sheriff called and left second message

## 2020-12-27 NOTE — Discharge Summary (Signed)
Kara Webster to be D/C'd  Ellin Mayhew  per NP order. Discussed with the patient and all questions fully answered. An After Visit Summary was printed and given to the patient. Patient escorted out and transported via [place]. Clois Dupes  12/27/2020 9:48 AM

## 2020-12-27 NOTE — ED Notes (Signed)
[facility] was called and left a message for transport

## 2020-12-30 ENCOUNTER — Telehealth: Payer: Self-pay | Admitting: Psychiatry

## 2020-12-30 NOTE — Telephone Encounter (Signed)
Please review message

## 2020-12-30 NOTE — Telephone Encounter (Signed)
Pt's daughter Autumn called asking for [name] to call her back.  She wants to know if [name] has talked to someone at a hospital for her mom or what to do about getting her meds.  Pls call Autumn back at [phone].  Next appt 1/27

## 2021-01-04 ENCOUNTER — Encounter: Payer: Self-pay | Admitting: Psychiatry

## 2021-01-04 ENCOUNTER — Ambulatory Visit (INDEPENDENT_AMBULATORY_CARE_PROVIDER_SITE_OTHER): Payer: 59 | Admitting: Psychiatry

## 2021-01-04 ENCOUNTER — Other Ambulatory Visit: Payer: Self-pay

## 2021-01-04 DIAGNOSIS — F3163 Bipolar disorder, current episode mixed, severe, without psychotic features: Secondary | ICD-10-CM

## 2021-01-04 DIAGNOSIS — F411 Generalized anxiety disorder: Secondary | ICD-10-CM | POA: Diagnosis not present

## 2021-01-04 DIAGNOSIS — F32A Depression, unspecified: Secondary | ICD-10-CM

## 2021-01-04 MED ORDER — LITHIUM CARBONATE 150 MG PO CAPS: 450.0000 mg | ORAL_CAPSULE | Freq: Two times a day (BID) | ORAL | 1 refills | Status: DC

## 2021-01-04 MED ORDER — LAMOTRIGINE 200 MG PO TABS: 200.0000 mg | ORAL_TABLET | Freq: Every day | ORAL | 1 refills | Status: DC

## 2021-01-04 NOTE — Progress Notes (Signed)
Kara Webster [id number] [birth year] 65 y.o.  Subjective:   Patient ID:  Kara Webster is a 65 y.o. (DOB May 17, 1955) female.  Chief Complaint:  Chief Complaint  Patient presents with   Hospitalization Follow-up    HPI Kara Webster presents to the office today for hospital discharge follow-up after hospitalization 12/28/20-01/03/21. She went to [facility]gent care. She reports, "I lost control over there" after waiting for several hours in an uncomfortable chair and then sitting in the floor. She reports that she received injectable medication and then woke up in [place]. She reports that she was hospitalized for 10 days. "I didn't get any help at all." She reports that she felt over-medicated during hospitalization.   Reports Lithium was increased from 450 mg po QHS to 450 mg BID and added Risperdal 0.5 mg po QHS.   Stayed with partner last night. Reports that she was not able to sleep last night. Reports that she was not tired "at all." She reports that she continues to feel "edgy" and notices irritability. She continues to experience anxiety. She reports lower energy and motivation when she is alone. Energy and motivation improved when she is around others. "I'm not suicidal, not at all."   Concerned about granddaughter's anxiety and depression. She recently learned that she had squamous cell carcinoma.   Has apt at [facility] counseling for therapy.   Past Psychiatric Medication Trials: Abilify- Caused tremors. Has not noticed any worsening s/s since stopping it. Cymbalta- Thinks this has been helpful for her depression. Has had severe discontinuation s/s with missed doses to the point of going to ER. Lamictal- Has been helpful for mood. Reports taking long term. Gabapentin- Takes for RLS with good response. Wellbutrin- Had convulsion when taking while drinking heavily.  Buspar    Review of Systems:  Review of Systems  Musculoskeletal:  Negative for gait problem.   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  Neurological:  Negative for tremors.  Psychiatric/Behavioral:         Please refer to HPI   Medications: I have reviewed the patient's current medications.  Current Outpatient Medications  Medication Sig Dispense Refill   busPIRone (BUSPAR) 15 MG tablet Take 1 tablet (15 mg total) by mouth 2 (two) times daily. 180 tablet 1   cholecalciferol (VITAMIN D3) 25 MCG (1000 UNIT) tablet Take 1,000 Units by mouth daily.     DULoxetine (CYMBALTA) 20 MG capsule Take 2 capsules (40 mg total) by mouth daily. 180 capsule 1   estradiol (ESTRACE) 0.5 MG tablet Take 0.5 tablets (0.25 mg total) by mouth daily. 45 tablet 0   gabapentin (NEURONTIN) 300 MG capsule Take 1 capsule (300 mg total) by mouth at bedtime. 90 capsule 1   progesterone (PROMETRIUM) 100 MG capsule Take 1 capsule (100 mg total) by mouth at bedtime. 90 capsule 0   risperiDONE (RISPERDAL) 0.5 MG tablet Take 0.5 mg by mouth at bedtime.     calcium carbonate (OS-CAL - DOSED IN MG OF ELEMENTAL CALCIUM) 1250 (500 Ca) MG tablet Take 1 tablet by mouth daily with breakfast.     lamoTRIgine (LAMICTAL) 200 MG tablet Take 1 tablet (200 mg total) by mouth daily. 90 tablet 1   lithium carbonate 150 MG capsule Take 3 capsules (450 mg total) by mouth 2 (two) times daily with a meal. 270 capsule 1   TURMERIC PO Take 1 capsule by mouth daily.  vitamin B-12 (CYANOCOBALAMIN) 500 MCG tablet Take 500 mcg by mouth daily.     No current facility-administered medications for this visit.    Medication Side Effects: None  Allergies:  Allergies  Allergen Reactions   Codeine Nausea And Vomiting    Past Medical History:  Diagnosis Date   Depression    RLS (restless legs syndrome)    Squamous cell carcinoma of skin 10/18/2020   Mid Tip of Nose (in situ)    Past Medical History, Surgical history, Social history, and Family history were reviewed and updated as appropriate.    Please see review of systems for further details on the patient's review from today.   Objective:   Physical Exam:  There were no vitals taken for this visit.  Physical Exam Constitutional:      General: She is not in acute distress. Musculoskeletal:        General: No deformity.  Neurological:     Mental Status: She is alert and oriented to person, place, and time.     Coordination: Coordination normal.  Psychiatric:        Attention and Perception: Attention and perception normal. She does not perceive auditory or visual hallucinations.        Mood and Affect: Mood is anxious. Mood is not depressed. Affect is not labile, blunt, angry or inappropriate.        Speech: Speech normal.        Behavior: Behavior normal.        Thought Content: Thought content normal. Thought content is not paranoid or delusional. Thought content does not include homicidal or suicidal ideation. Thought content does not include homicidal or suicidal plan.        Cognition and Memory: Cognition and memory normal.        Judgment: Judgment normal.     Comments: Insight intact    Lab Review:     Component Value Date/Time   NA 140 11/11/2019 1158   K 5.3 11/11/2019 1158   CL 105 11/11/2019 1158   CO2 27 11/11/2019 1158   GLUCOSE 83 11/11/2019 1158   BUN 7 11/11/2019 1158   CREATININE 0.79 11/11/2019 1158   CALCIUM 9.7 11/11/2019 1158   PROT 6.9 11/11/2019 1158   ALBUMIN 4.2 10/27/2018 0256   AST 20 11/11/2019 1158   ALT 7 11/11/2019 1158   ALKPHOS 55 10/27/2018 0256   BILITOT 0.7 11/11/2019 1158   GFRNONAA >60 10/27/2018 0256   GFRAA >60 10/27/2018 0256       Component Value Date/Time   WBC 5.7 11/11/2019 1158   RBC 4.77 11/11/2019 1158   HGB 14.3 11/11/2019 1158   HCT 42.4 11/11/2019 1158   PLT 326 11/11/2019 1158   MCV 88.9 11/11/2019 1158   MCV 88.8 05/27/2013 1250   MCH 30.0 11/11/2019 1158   MCHC 33.7 11/11/2019 1158   RDW 12.0 11/11/2019 1158   LYMPHSABS 1.5 10/27/2018 0256    MONOABS 0.5 10/27/2018 0256   EOSABS 0.1 10/27/2018 0256   BASOSABS 0.0 10/27/2018 0256    No results found for: POCLITH, LITHIUM   No results found for: PHENYTOIN, PHENOBARB, VALPROATE, CBMZ   .res Assessment: Plan:    Reviewed hospital discharge medication list with pt and helped identify capsules and tablets of medications that she had in a small tin. Encouraged her to confirm Duloxetine dose at home since she indicated she may be taking 2 or 3 of the 20 mg capsules.  Encouraged her to continue hospital  discharge medications and resume Lithium 450 mg BID and not HS only since this dose may be more effective for mood stabilization and more time at this dose is likely needed she has noticed some continued occasional impulsivity.  Recommend continuing Risperdal 0.5 mg po QHS for mood stabilization and reviewed indications.  Continue Buspar 15 mg po BID for anxiety.  Continue Lamictal 200 mg po qd for mood s/s.  Agree with plan to start therapy at [facility]. Pt reports considering transferring care for medication management to [facility] as well. Discussed that more frequent follow-up would likely be beneficial for her treatment and lower cost for visits may allow her to be followed more frequently. Discussed scheduling a follow-up apt in this office and cancelling apt if she is able to be seen soon at [facility].   Release of information obtain for [facility] obtained at time of visit and request for hospital records sent.  Patient advised to contact office with any questions, adverse effects, or acute worsening in signs and symptoms.   Kara Webster was seen today for hospitalization follow-up.  Diagnoses and all orders for this visit:  Bipolar disorder, current episode mixed, severe, without psychotic features ([place]) -     lithium carbonate 150 MG capsule; Take 3 capsules (450 mg total) by mouth 2 (two) times daily with a meal. -     lamoTRIgine  (LAMICTAL) 200 MG tablet; Take 1 tablet (200 mg total) by mouth daily.  Generalized anxiety disorder -     lamoTRIgine (LAMICTAL) 200 MG tablet; Take 1 tablet (200 mg total) by mouth daily.    Please see After Visit Summary for patient specific instructions.  Future Appointments  Date Time Provider [place]  02/08/2021  9:00 AM [name], PMHNP [facility] None  03/10/2021  3:00 PM Princess Bruins, MD [facility] None  04/15/2021  9:30 AM [name], PMHNP [facility] None    No orders of the defined types were placed in this encounter.   -------------------------------

## 2021-01-04 NOTE — Patient Instructions (Signed)
Please confirm dose of Duloxetine (Cymbalta). It should be two of 20 mg capsules (yellowish orange capsule).

## 2021-01-10 ENCOUNTER — Other Ambulatory Visit: Payer: Self-pay | Admitting: Obstetrics & Gynecology

## 2021-01-10 DIAGNOSIS — Z1231 Encounter for screening mammogram for malignant neoplasm of breast: Secondary | ICD-10-CM

## 2021-01-11 ENCOUNTER — Ambulatory Visit: Payer: 59

## 2021-01-27 ENCOUNTER — Telehealth: Payer: Self-pay | Admitting: Psychiatry

## 2021-01-27 ENCOUNTER — Other Ambulatory Visit: Payer: Self-pay

## 2021-01-27 MED ORDER — RISPERIDONE 0.5 MG PO TABS: 0.5000 mg | ORAL_TABLET | Freq: Every day | ORAL | 0 refills | Status: DC

## 2021-01-27 NOTE — Telephone Encounter (Signed)
Kara Webster was D/C from [facility] in [place] on 10/11. The doctor who treated her placed her on Risperidone. She was told that he would not be able to fill her medicine after leaving the hospital and JC would have to. She has an appointment with [name] on 11/22. She needs a refill called to:  [facility] [id number] - [address], [place]  Phone:  [phone]  Fax:  [phone]

## 2021-01-27 NOTE — Telephone Encounter (Signed)
Rx sent 

## 2021-01-27 NOTE — Telephone Encounter (Signed)
Ok to send

## 2021-02-03 ENCOUNTER — Other Ambulatory Visit: Payer: Self-pay | Admitting: Obstetrics & Gynecology

## 2021-02-04 NOTE — Telephone Encounter (Signed)
Annual exam scheduled on 03/10/21

## 2021-02-08 ENCOUNTER — Ambulatory Visit: Payer: 59 | Admitting: Psychiatry

## 2021-02-09 ENCOUNTER — Ambulatory Visit: Payer: 59

## 2021-02-25 ENCOUNTER — Other Ambulatory Visit: Payer: Self-pay | Admitting: Psychiatry

## 2021-03-10 ENCOUNTER — Other Ambulatory Visit: Payer: Self-pay

## 2021-03-10 ENCOUNTER — Ambulatory Visit (INDEPENDENT_AMBULATORY_CARE_PROVIDER_SITE_OTHER): Payer: 59 | Admitting: Obstetrics & Gynecology

## 2021-03-10 ENCOUNTER — Other Ambulatory Visit (HOSPITAL_COMMUNITY)
Admission: RE | Admit: 2021-03-10 | Discharge: 2021-03-10 | Disposition: A | Discharge status: Home / Self Care | Payer: 59 | Source: Ambulatory Visit | Attending: Obstetrics & Gynecology | Admitting: Obstetrics & Gynecology

## 2021-03-10 ENCOUNTER — Encounter: Payer: Self-pay | Admitting: Obstetrics & Gynecology

## 2021-03-10 VITALS — BP 130/86 | HR 72 | Ht 63.75 in | Wt 134.0 lb

## 2021-03-10 DIAGNOSIS — Z79890 Hormone replacement therapy (postmenopausal): Secondary | ICD-10-CM | POA: Diagnosis not present

## 2021-03-10 DIAGNOSIS — M8589 Other specified disorders of bone density and structure, multiple sites: Secondary | ICD-10-CM

## 2021-03-10 DIAGNOSIS — Z01419 Encounter for gynecological examination (general) (routine) without abnormal findings: Secondary | ICD-10-CM | POA: Insufficient documentation

## 2021-03-10 MED ORDER — ESTRADIOL 0.5 MG PO TABS: 0.2500 mg | ORAL_TABLET | Freq: Every day | ORAL | 4 refills | Status: DC

## 2021-03-10 MED ORDER — PROGESTERONE MICRONIZED 100 MG PO CAPS: 100.0000 mg | ORAL_CAPSULE | Freq: Every day | ORAL | 4 refills | Status: DC

## 2021-03-10 NOTE — Progress Notes (Addendum)
Kara Webster [birth year] [id number]   History:    65 y.o.  G2P2L2 Same sex partner x 15 yrs.   RP:  Established patient presenting for annual gyn exam    HPI: Postmenopause, well on HRT with Estradiol 0.5 mg 1/2 tab daily and Prometrium 100 mg HS.  No PMB.  No pelvic pain.  Same sex partner. Pap Neg 05/2017.  Breasts normal. Overdue for mammo, will schedule.  BMI normal at 23.18.  Walks her dogs. Healthy nutrition.  F/U for Fasting health labs here.  BD 10/2019 Osteopenia.  Needs to schedule [facility].  Past medical history,surgical history, family history and social history were all reviewed and documented in the EPIC chart.  Gynecologic History No LMP recorded. Patient is postmenopausal.  Obstetric History OB History  Gravida Para Term Preterm AB Living  '2 2       2  ' SAB IAB Ectopic Multiple Live Births               # Outcome Date GA Lbr Len/2nd Weight Sex Delivery Anes PTL Lv  2 Para           1 Para              ROS: A ROS was performed and pertinent positives and negatives are included in the history.  GENERAL: No fevers or chills. HEENT: No change in vision, no earache, sore throat or sinus congestion. NECK: No pain or stiffness. CARDIOVASCULAR: No chest pain or pressure. No palpitations. PULMONARY: No shortness of breath, cough or wheeze. GASTROINTESTINAL: No abdominal pain, nausea, vomiting or diarrhea, melena or bright red blood per rectum. GENITOURINARY: No urinary frequency, urgency, hesitancy or dysuria. MUSCULOSKELETAL: No joint or muscle pain, no back pain, no recent trauma. DERMATOLOGIC: No rash, no itching, no lesions. ENDOCRINE: No polyuria, polydipsia, no heat or cold intolerance. No recent change in weight. HEMATOLOGICAL: No anemia or easy bruising or bleeding. NEUROLOGIC: No headache, seizures, numbness, tingling or weakness. PSYCHIATRIC: No depression, no loss of interest in normal activity or change in sleep pattern.     Exam:   BP 130/86    Pulse 72    Ht 5'  3.75" (1.619 m)    Wt 134 lb (60.8 kg)    SpO2 99%    BMI 23.18 kg/m   Body mass index is 23.18 kg/m.  General appearance : Well developed well nourished female. No acute distress HEENT: Eyes: no retinal hemorrhage or exudates,  Neck supple, trachea midline, no carotid bruits, no thyroidmegaly Lungs: Clear to auscultation, no rhonchi or wheezes, or rib retractions  Heart: Regular rate and rhythm, no murmurs or gallops Breast:Examined in sitting and supine position were symmetrical in appearance, no palpable masses or tenderness,  no skin retraction, no nipple inversion, no nipple discharge, no skin discoloration, no axillary or supraclavicular lymphadenopathy Abdomen: no palpable masses or tenderness, no rebound or guarding Extremities: no edema or skin discoloration or tenderness  Pelvic: Vulva: Normal             Vagina: No gross lesions or discharge  Cervix: No gross lesions or discharge.  Pap reflex done.  Uterus  AV, normal size, shape and consistency, non-tender and mobile  Adnexa  Without masses or tenderness  Anus: Normal   Assessment/Plan:  65 y.o. female for annual exam   1. Encounter for routine gynecological examination with Papanicolaou smear of cervix Postmenopause, well on HRT with Estradiol 0.5 mg 1/2 tab daily and Prometrium 100 mg HS.  No PMB.  No pelvic pain.  Same sex partner. Pap Neg 05/2017.  Pap reflex done today. Breasts normal. Overdue for mammo, will schedule.  BMI normal at 23.18.  Walks her dogs. Healthy nutrition.  F/U for Fasting health labs here.  BD 10/2019 Osteopenia.  Needs to schedule [facility]. - CBC; Future - Comp Met (CMET); Future - Lipid Profile; Future - TSH; Future - Vitamin D (25 hydroxy); Future - Pap reflex  2. Postmenopausal hormone replacement therapy Postmenopause, well on HRT with Estradiol 0.5 mg 1/2 tab daily and Prometrium 100 mg HS.  No PMB.  No pelvic pain.    3. Osteopenia of multiple sites Osteopenia on BD 10/2019.  Will repeat at  age 66 in 2 years.  Vit D supplement, Ca++ 1.5 g/d total, wt bearing physical activities.  Other orders - progesterone (PROMETRIUM) 100 MG capsule; Take 1 capsule (100 mg total) by mouth at bedtime. - estradiol (ESTRACE) 0.5 MG tablet; Take 0.5 tablets (0.25 mg total) by mouth daily.   Princess Bruins MD, 3:14 PM 03/10/2021

## 2021-03-10 NOTE — Addendum Note (Signed)
Addended by: Princess Bruins on: 03/10/2021 03:59 PM   Modules accepted: Orders

## 2021-03-16 LAB — CYTOLOGY - PAP: Diagnosis: NEGATIVE

## 2021-03-22 ENCOUNTER — Ambulatory Visit: Payer: 59 | Admitting: Dermatology

## 2021-03-29 ENCOUNTER — Other Ambulatory Visit: Payer: Self-pay

## 2021-03-29 MED ORDER — RISPERIDONE 0.5 MG PO TABS: 0.5000 mg | ORAL_TABLET | Freq: Every day | ORAL | 0 refills | Status: DC

## 2021-03-31 ENCOUNTER — Encounter: Payer: Self-pay | Admitting: Obstetrics & Gynecology

## 2021-04-15 ENCOUNTER — Ambulatory Visit: Payer: 59 | Admitting: Psychiatry

## 2021-04-26 ENCOUNTER — Other Ambulatory Visit: Payer: Self-pay | Admitting: Psychiatry

## 2021-05-05 ENCOUNTER — Telehealth: Payer: Self-pay

## 2021-05-05 ENCOUNTER — Other Ambulatory Visit: Payer: Self-pay | Admitting: Psychiatry

## 2021-05-05 DIAGNOSIS — F3163 Bipolar disorder, current episode mixed, severe, without psychotic features: Secondary | ICD-10-CM

## 2021-05-05 NOTE — Telephone Encounter (Signed)
Patient called in voice mail about scheduling a lab appointment. I called her back but she had already spoken with appt scheduler and had arranged her lab appt.  I did review fasting instructions for her lipid profile. She was appreciative of the returned call.

## 2021-05-09 ENCOUNTER — Other Ambulatory Visit: Payer: 59

## 2021-05-09 ENCOUNTER — Other Ambulatory Visit: Payer: Self-pay

## 2021-05-09 DIAGNOSIS — Z01419 Encounter for gynecological examination (general) (routine) without abnormal findings: Secondary | ICD-10-CM

## 2021-05-09 LAB — LIPID PANEL
Cholesterol: 231 mg/dL — ABNORMAL HIGH (ref <200)
HDL: 73 mg/dL (ref >=50)
LDL Cholesterol (Calc): 141 mg/dL (calc) — ABNORMAL HIGH
Non-HDL Cholesterol (Calc): 158 mg/dL (calc) — ABNORMAL HIGH (ref <130)
Total CHOL/HDL Ratio: 3.2 (calc) (ref <5.0)
Triglycerides: 74 mg/dL (ref <150)

## 2021-05-09 LAB — CBC
HCT: 42.4 % (ref 35.0–45.0)
Hemoglobin: 13.8 g/dL (ref 11.7–15.5)
MCH: 29.4 pg (ref 27.0–33.0)
MCHC: 32.5 g/dL (ref 32.0–36.0)
MCV: 90.4 fL (ref 80.0–100.0)
MPV: 8.7 fL (ref 7.5–12.5)
Platelets: 354 10*3/uL (ref 140–400)
RBC: 4.69 10*6/uL (ref 3.80–5.10)
RDW: 11.8 % (ref 11.0–15.0)
WBC: 6 10*3/uL (ref 3.8–10.8)

## 2021-05-09 LAB — COMPREHENSIVE METABOLIC PANEL
AG Ratio: 1.9 (calc) (ref 1.0–2.5)
ALT: 7 U/L (ref 6–29)
AST: 16 U/L (ref 10–35)
Albumin: 4.5 g/dL (ref 3.6–5.1)
Alkaline phosphatase (APISO): 53 U/L (ref 37–153)
BUN: 11 mg/dL (ref 7–25)
CO2: 35 mmol/L — ABNORMAL HIGH (ref 20–32)
Calcium: 10.3 mg/dL (ref 8.6–10.4)
Chloride: 104 mmol/L (ref 98–110)
Creat: 0.85 mg/dL (ref 0.50–1.05)
Globulin: 2.4 g/dL (calc) (ref 1.9–3.7)
Glucose, Bld: 88 mg/dL (ref 65–99)
Potassium: 4.9 mmol/L (ref 3.5–5.3)
Sodium: 145 mmol/L (ref 135–146)
Total Bilirubin: 0.5 mg/dL (ref 0.2–1.2)
Total Protein: 6.9 g/dL (ref 6.1–8.1)

## 2021-05-09 LAB — TSH: TSH: 7.37 mIU/L — ABNORMAL HIGH (ref 0.40–4.50)

## 2021-05-09 LAB — VITAMIN D 25 HYDROXY (VIT D DEFICIENCY, FRACTURES): Vit D, 25-Hydroxy: 90 ng/mL (ref 30–100)

## 2021-05-13 ENCOUNTER — Ambulatory Visit (INDEPENDENT_AMBULATORY_CARE_PROVIDER_SITE_OTHER): Payer: 59 | Admitting: Psychiatry

## 2021-05-13 ENCOUNTER — Encounter: Payer: Self-pay | Admitting: Psychiatry

## 2021-05-13 ENCOUNTER — Other Ambulatory Visit: Payer: Self-pay

## 2021-05-13 DIAGNOSIS — F3163 Bipolar disorder, current episode mixed, severe, without psychotic features: Secondary | ICD-10-CM | POA: Diagnosis not present

## 2021-05-13 DIAGNOSIS — F32A Depression, unspecified: Secondary | ICD-10-CM

## 2021-05-13 DIAGNOSIS — F411 Generalized anxiety disorder: Secondary | ICD-10-CM | POA: Diagnosis not present

## 2021-05-13 MED ORDER — LAMOTRIGINE 200 MG PO TABS: 200.0000 mg | ORAL_TABLET | Freq: Every day | ORAL | 1 refills | Status: DC

## 2021-05-13 MED ORDER — LITHIUM CARBONATE 300 MG PO CAPS: ORAL_CAPSULE | ORAL | 1 refills | Status: DC

## 2021-05-13 MED ORDER — DULOXETINE HCL 20 MG PO CPEP: 40.0000 mg | ORAL_CAPSULE | Freq: Every day | ORAL | 1 refills | Status: DC

## 2021-05-13 MED ORDER — BUSPIRONE HCL 15 MG PO TABS: 15.0000 mg | ORAL_TABLET | Freq: Two times a day (BID) | ORAL | 1 refills | Status: DC

## 2021-05-13 MED ORDER — RISPERIDONE 0.5 MG PO TABS: 0.5000 mg | ORAL_TABLET | Freq: Every day | ORAL | 1 refills | Status: DC

## 2021-05-13 MED ORDER — GABAPENTIN 300 MG PO CAPS: 300.0000 mg | ORAL_CAPSULE | Freq: Every day | ORAL | 1 refills | Status: DC

## 2021-05-13 NOTE — Progress Notes (Signed)
Kara Webster [id number] January 18, 1956 66 y.o.  Subjective:   Patient ID:  Kara Webster is a 66 y.o. (DOB 08/03/1955) female.  Chief Complaint:  Chief Complaint  Patient presents with   Other    Mood instability, Anxiety    HPI Kara Webster presents to the office today for follow-up of Bipolar D/O and anxiety. Last seen 01/04/21. She reports that in November she signed up for a [place] cruise by herself on a whim that cost $5,000- "I had no idea what I was doing." She now has anxiety about going and about her finances. She reports excessive spending and have "drained my bank account." Denies any other impulsive behavior. She reports that her mood was "very much on top of the world there for awhile" and now experiencing more depression and anxiety- "crash and burn." Sleeping well. Eating well. She reports having "some" suicidal thoughts over the last few days "with the reality of what I have done." Denies suicidal intent.    She has been seeing a therapist, Shanon Brow, at [facility].   [place] Office Visit from 05/13/2021 in [facility]  AIMS Total Score 1     Past Psychiatric Medication Trials: Abilify- Caused tremors. Has not noticed any worsening s/s since stopping it. Cymbalta- Thinks this has been helpful for her depression. Has had severe discontinuation s/s with missed doses to the point of going to ER. Lamictal- Has been helpful for mood. Reports taking long term. Gabapentin- Takes for RLS with good response. Wellbutrin- Had convulsion when taking while drinking heavily.  Buspar   Review of Systems:  Review of Systems  Gastrointestinal: Negative.   Musculoskeletal:  Negative for gait problem.  Neurological:  Negative for tremors.  Psychiatric/Behavioral:         Please refer to HPI   Medications: I have reviewed the patient's current medications.  Current Outpatient Medications  Medication Sig Dispense Refill    cholecalciferol (VITAMIN D3) 25 MCG (1000 UNIT) tablet Take 1,000 Units by mouth daily.     estradiol (ESTRACE) 0.5 MG tablet Take 0.5 tablets (0.25 mg total) by mouth daily. 45 tablet 4   progesterone (PROMETRIUM) 100 MG capsule Take 1 capsule (100 mg total) by mouth at bedtime. 90 capsule 4   vitamin B-12 (CYANOCOBALAMIN) 500 MCG tablet Take 500 mcg by mouth daily.     busPIRone (BUSPAR) 15 MG tablet Take 1 tablet (15 mg total) by mouth 2 (two) times daily. 180 tablet 1   DULoxetine (CYMBALTA) 20 MG capsule Take 2 capsules (40 mg total) by mouth daily. 180 capsule 1   gabapentin (NEURONTIN) 300 MG capsule Take 1 capsule (300 mg total) by mouth at bedtime. 90 capsule 1   lamoTRIgine (LAMICTAL) 200 MG tablet Take 1 tablet (200 mg total) by mouth daily. 90 tablet 1   lithium carbonate 300 MG capsule TAKE THREE CAPSULES BY MOUTH EVERY NIGHT AT BEDTIME 180 capsule 1   risperiDONE (RISPERDAL) 0.5 MG tablet Take 1 tablet (0.5 mg total) by mouth at bedtime. 90 tablet 1   TURMERIC PO Take 1 capsule by mouth daily. (Patient not taking: Reported on 05/13/2021)     No current facility-administered medications for this visit.    Medication Side Effects: None  Allergies:  Allergies  Allergen Reactions   Aripiprazole     Other reaction(s): dyskinesia   Codeine Nausea And Vomiting   Cetirizine Rash    Past Medical History:  Diagnosis Date   Depression  RLS (restless legs syndrome)    Squamous cell carcinoma of skin 10/18/2020   Mid Tip of Nose (in situ)    Past Medical History, Surgical history, Social history, and Family history were reviewed and updated as appropriate.   Please see review of systems for further details on the patient's review from today.   Objective:   Physical Exam:  There were no vitals taken for this visit.  Physical Exam Constitutional:      General: She is not in acute distress. Musculoskeletal:        General: No deformity.  Neurological:     Mental  Status: She is alert and oriented to person, place, and time.     Coordination: Coordination normal.  Psychiatric:        Attention and Perception: Attention and perception normal. She does not perceive auditory or visual hallucinations.        Mood and Affect: Mood is anxious. Mood is not depressed. Affect is not labile, blunt, angry or inappropriate.        Speech: Speech normal.        Behavior: Behavior normal.        Thought Content: Thought content normal. Thought content is not paranoid or delusional. Thought content does not include homicidal or suicidal ideation. Thought content does not include homicidal or suicidal plan.        Cognition and Memory: Cognition and memory normal.        Judgment: Judgment normal.     Comments: Insight intact    Lab Review:     Component Value Date/Time   NA 145 05/09/2021 0845   K 4.9 05/09/2021 0845   CL 104 05/09/2021 0845   CO2 35 (H) 05/09/2021 0845   GLUCOSE 88 05/09/2021 0845   BUN 11 05/09/2021 0845   CREATININE 0.85 05/09/2021 0845   CALCIUM 10.3 05/09/2021 0845   PROT 6.9 05/09/2021 0845   ALBUMIN 4.2 10/27/2018 0256   AST 16 05/09/2021 0845   ALT 7 05/09/2021 0845   ALKPHOS 55 10/27/2018 0256   BILITOT 0.5 05/09/2021 0845   GFRNONAA >60 10/27/2018 0256   GFRAA >60 10/27/2018 0256       Component Value Date/Time   WBC 6.0 05/09/2021 0845   RBC 4.69 05/09/2021 0845   HGB 13.8 05/09/2021 0845   HCT 42.4 05/09/2021 0845   PLT 354 05/09/2021 0845   MCV 90.4 05/09/2021 0845   MCV 88.8 05/27/2013 1250   MCH 29.4 05/09/2021 0845   MCHC 32.5 05/09/2021 0845   RDW 11.8 05/09/2021 0845   LYMPHSABS 1.5 10/27/2018 0256   MONOABS 0.5 10/27/2018 0256   EOSABS 0.1 10/27/2018 0256   BASOSABS 0.0 10/27/2018 0256    No results found for: POCLITH, LITHIUM   No results found for: PHENYTOIN, PHENOBARB, VALPROATE, CBMZ   .res Assessment: Plan:    Discussed mood episodes and symptoms of mania and depression. Discussed that recent  s/s are consistent with hypomania/mania and that she now seems to be having more depressive s/s. She reports that she would prefer to continue current medications without changes since she feels that her mood has been more stable compared to times in the past. Discussed potential benefits, risks, and side effects of increasing Lithium to potentially prevent manic s/s and to also improve depressive s/s. Pt agrees to increase in Lithium from 450 mg po QHS to 600 mg po QHS for mood s/s.  Continue Buspar 15 mg po BID for anxiety.  Continue duloxetine  40 mg daily for mood and anxiety signs and symptoms. Continue lamotrigine 200 mg daily for mood signs and symptoms. Continue risperidone 0.5 mg at bedtime for mood signs and symptoms. Continue gabapentin 300 mg at bedtime for anxiety. Recommend continuing psychotherapy. Patient to follow-up in 6 months or sooner if clinically indicated. Patient advised to contact office with any questions, adverse effects, or acute worsening in signs and symptoms.   Kara Webster was seen today for other.  Diagnoses and all orders for this visit:  Generalized anxiety disorder -     busPIRone (BUSPAR) 15 MG tablet; Take 1 tablet (15 mg total) by mouth 2 (two) times daily. -     DULoxetine (CYMBALTA) 20 MG capsule; Take 2 capsules (40 mg total) by mouth daily. -     gabapentin (NEURONTIN) 300 MG capsule; Take 1 capsule (300 mg total) by mouth at bedtime. -     lamoTRIgine (LAMICTAL) 200 MG tablet; Take 1 tablet (200 mg total) by mouth daily.  Depression, unspecified depression type -     DULoxetine (CYMBALTA) 20 MG capsule; Take 2 capsules (40 mg total) by mouth daily.  Bipolar disorder, current episode mixed, severe, without psychotic features ([place]) -     lamoTRIgine (LAMICTAL) 200 MG tablet; Take 1 tablet (200 mg total) by mouth daily. -     lithium carbonate 300 MG capsule; TAKE THREE CAPSULES BY MOUTH EVERY NIGHT AT BEDTIME  Other orders -     risperiDONE  (RISPERDAL) 0.5 MG tablet; Take 1 tablet (0.5 mg total) by mouth at bedtime.     Please see After Visit Summary for patient specific instructions.  Future Appointments  Date Time Provider [place]  11/10/2021  9:30 AM [name], PMHNP CP-CP None    No orders of the defined types were placed in this encounter.   -------------------------------

## 2021-05-18 ENCOUNTER — Telehealth: Payer: Self-pay | Admitting: Psychiatry

## 2021-05-18 ENCOUNTER — Telehealth: Payer: Self-pay

## 2021-05-18 DIAGNOSIS — R7989 Other specified abnormal findings of blood chemistry: Secondary | ICD-10-CM

## 2021-05-18 NOTE — Telephone Encounter (Signed)
Notified patient that [facility] had sent in a new Rx for 300 mg capsules. She still has 150 mg capsules. Told her to take 150 mg capsules to add up to the increased dose and when those ran out she could start the new Rx.  ?

## 2021-05-18 NOTE — Telephone Encounter (Signed)
Patient called because she saw her TSH level is elevated at 7.37. Asked what you recommend.Marland Kitchen ?

## 2021-05-18 NOTE — Telephone Encounter (Signed)
Patient called in regarding an increase in lithium. States that a last visit it was discussed her uping her dosage. She would like to know if the dosage increase could be started now or should she wait. Please rtc to discuss [phone]. Appt 8/24. Pharm[facility] [place] ?

## 2021-05-23 ENCOUNTER — Other Ambulatory Visit: Payer: Self-pay

## 2021-05-23 ENCOUNTER — Other Ambulatory Visit: Payer: 59

## 2021-05-23 DIAGNOSIS — R7989 Other specified abnormal findings of blood chemistry: Secondary | ICD-10-CM

## 2021-05-23 NOTE — Telephone Encounter (Signed)
Dr.Lavoie replied "She needs to complete her thyroid function work up with a FT3, FT4, Thyroid Stimulating Immunoglobulins (TSI).  Probable Hypothyroidism.  Would be a good idea to establish with a Fam MD as well." ? ? ?Patient informed, lab appointment scheduled today.  ?

## 2021-05-23 NOTE — Telephone Encounter (Signed)
Left message for patient to call.

## 2021-05-25 LAB — THYROID STIMULATING IMMUNOGLOBULIN: TSI: <89 % baseline (ref <140)

## 2021-05-25 LAB — T4, FREE: Free T4: 0.7 ng/dL — ABNORMAL LOW (ref 0.8–1.8)

## 2021-05-25 LAB — T3, FREE: T3, Free: 2.4 pg/mL (ref 2.3–4.2)

## 2021-06-02 IMAGING — MG DIGITAL SCREENING BILAT W/ TOMO W/ CAD
6 of 10 series · 6 of 30 positions shown · non-contrast
Comparison: Previous exam(s).

CLINICAL DATA: Screening.

EXAM:
DIGITAL SCREENING BILATERAL MAMMOGRAM WITH TOMO AND CAD

[L MLO synth-2D (1 of 2)]
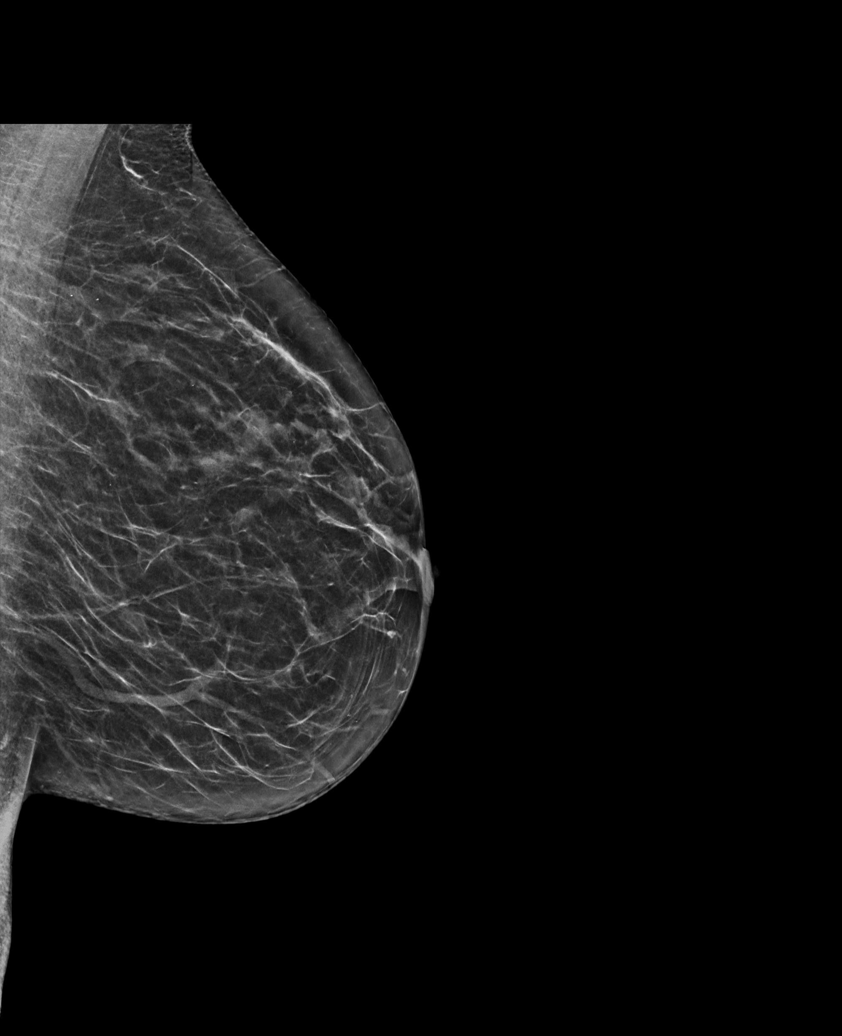

[R CC synth-2D]
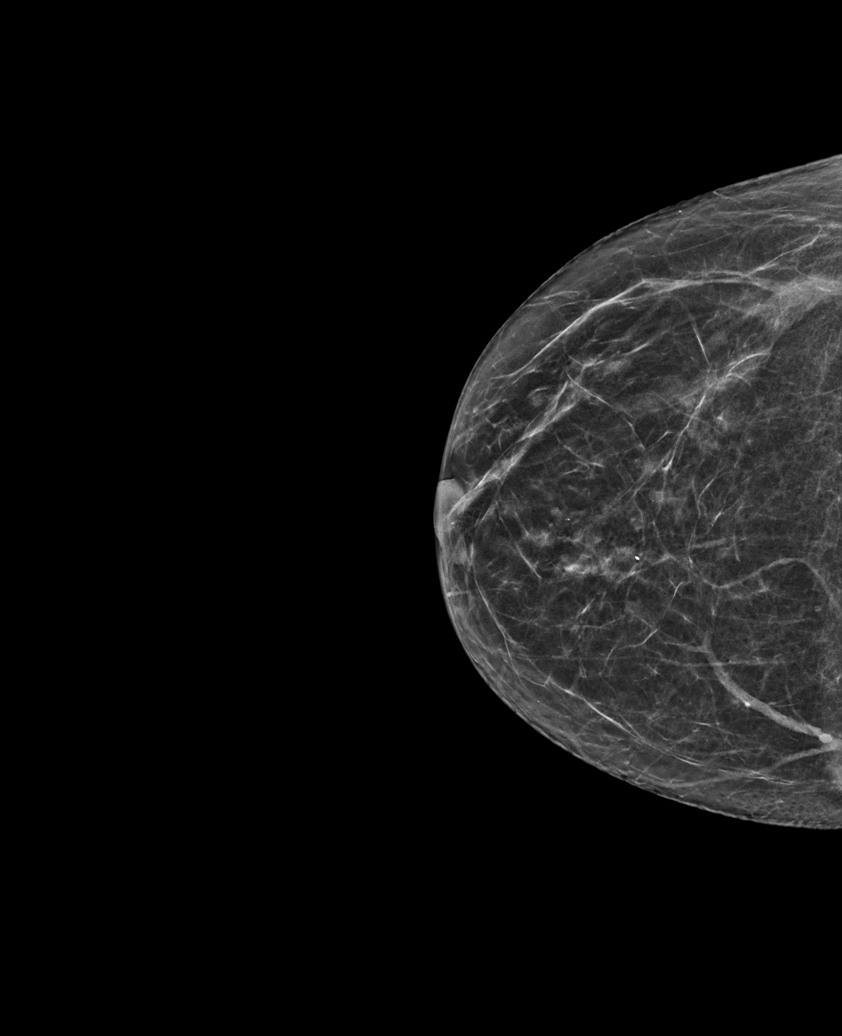

[L CC synth-2D]
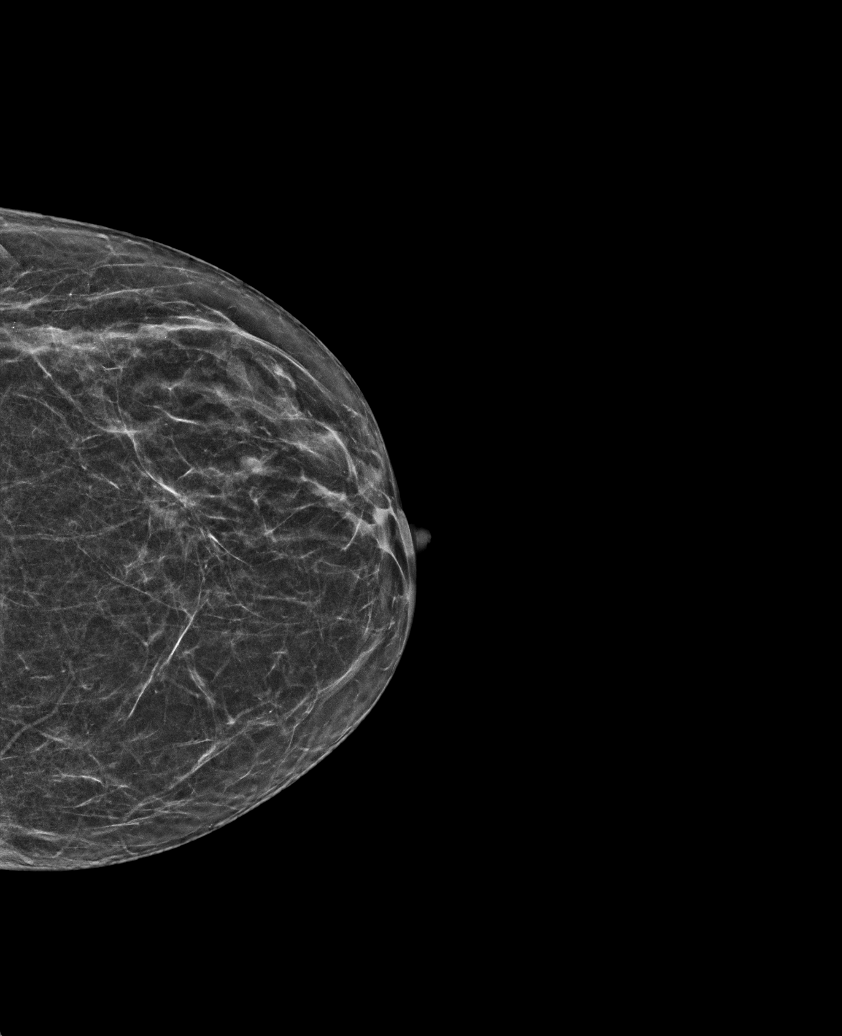

[R MLO synth-2D]
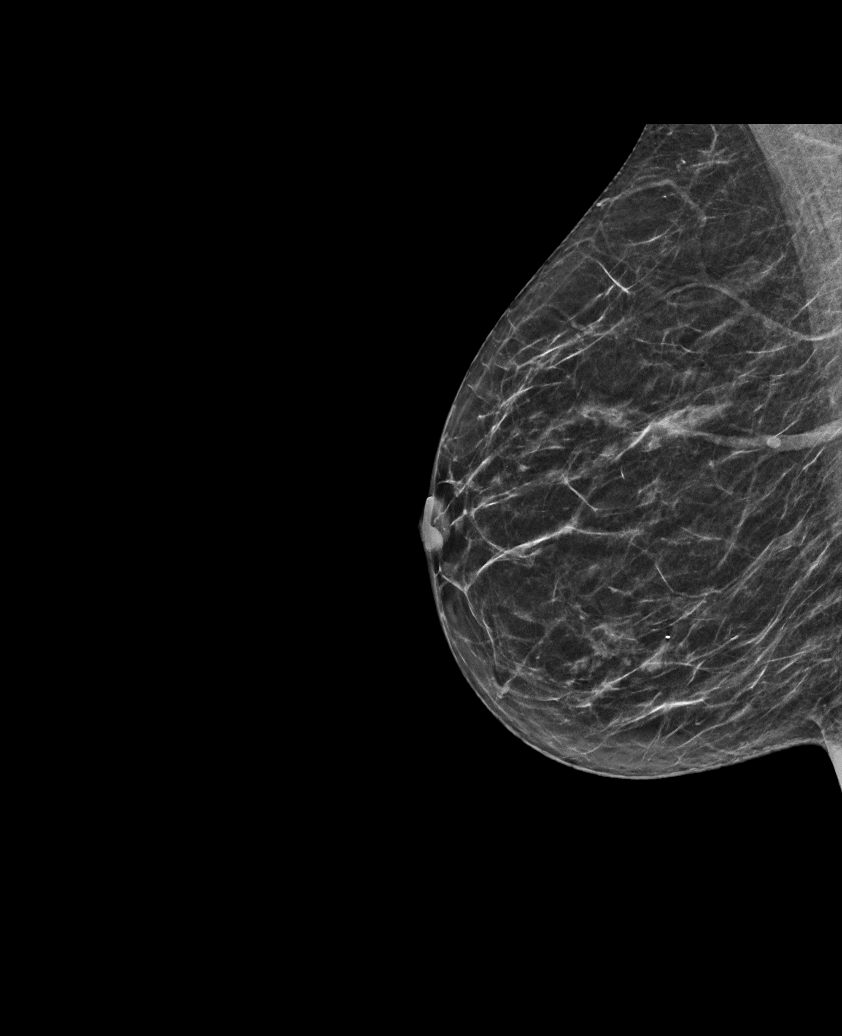

[L MLO synth-2D (2 of 2)]
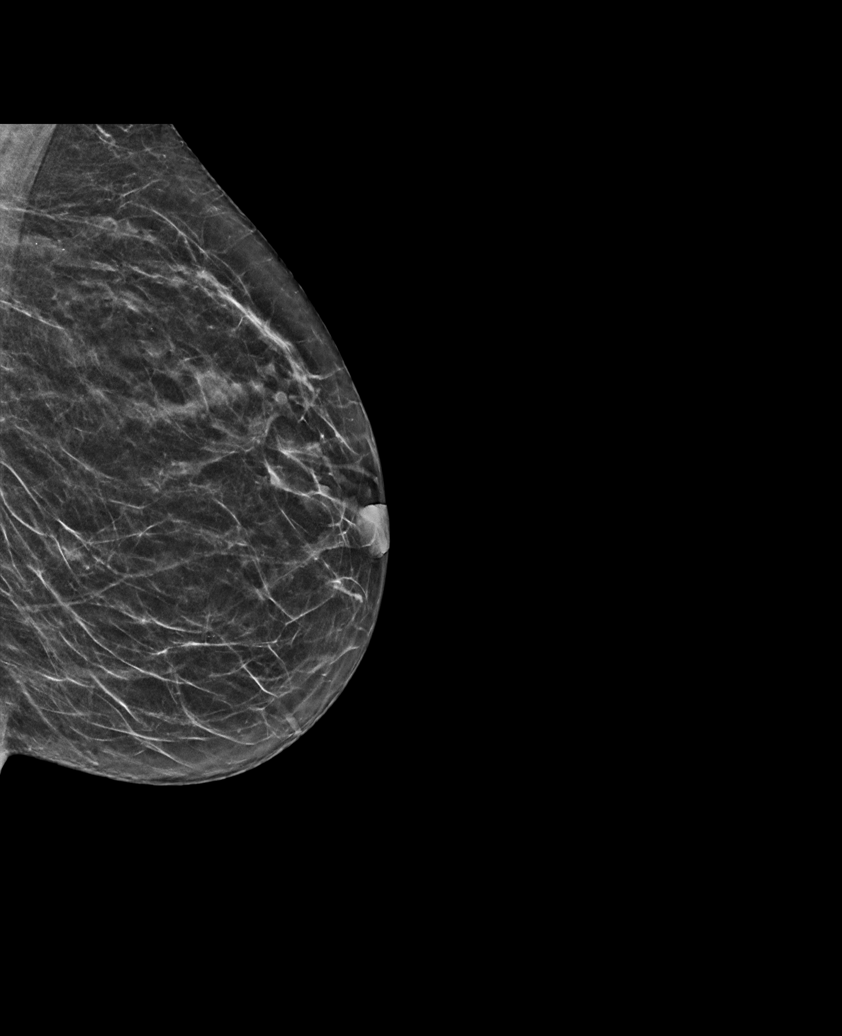

[R MLO tomo · tomo slice 25/50.0]
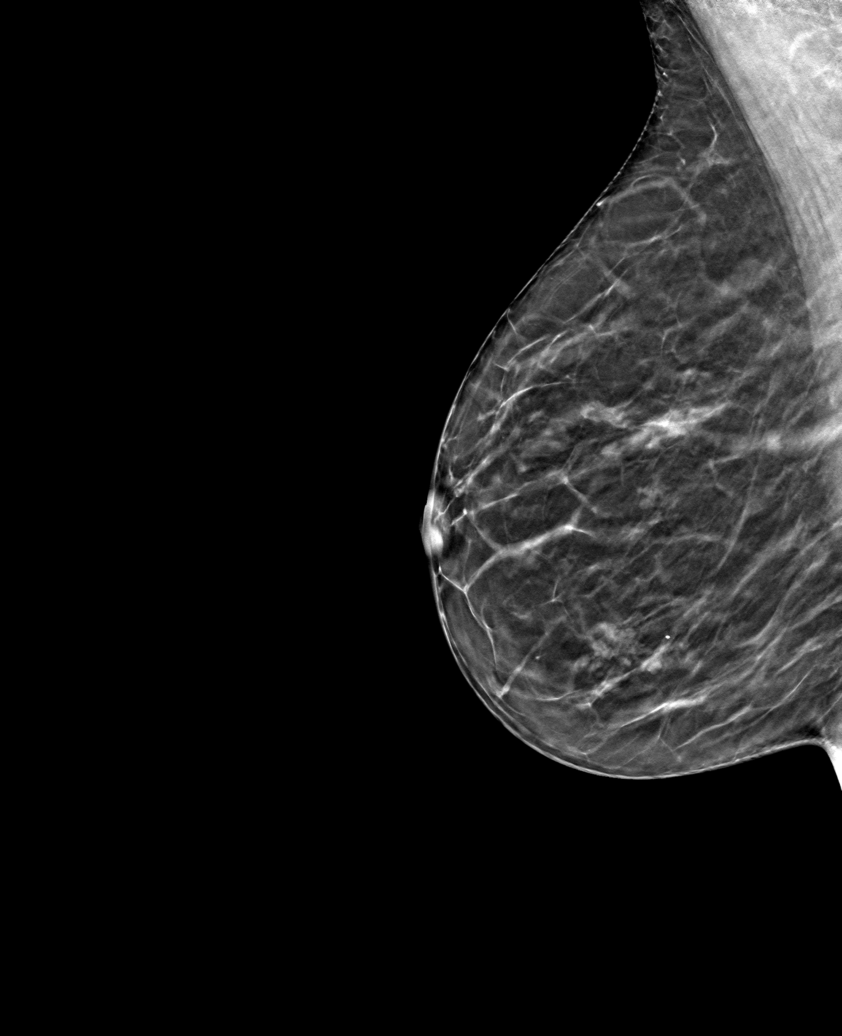

[6 of 30 positions shown; findings below may reference images not displayed]

ACR Breast Density Category b: There are scattered areas of
fibroglandular density.
FINDINGS: There are no findings suspicious for malignancy. Images were
processed with CAD.
IMPRESSION: No mammographic evidence of malignancy. A result letter of this
screening mammogram will be mailed directly to the patient.

RECOMMENDATION:
Screening mammogram in one year. (Code:[id number])

BI-RADS CATEGORY  1: Negative.

## 2021-06-06 ENCOUNTER — Telehealth: Payer: Self-pay | Admitting: *Deleted

## 2021-06-06 DIAGNOSIS — E038 Other specified hypothyroidism: Secondary | ICD-10-CM

## 2021-06-06 NOTE — Telephone Encounter (Signed)
-----   Message from Princess Bruins, MD sent at 06/06/2021  6:55 AM EDT ----- ?Free T4 is low at 0.7.  Free T3 normal at 2.4 (low side of normal).  TSI wnl.  Refer to Endocrino to manage Hypothyroidism. ?

## 2021-06-13 NOTE — Telephone Encounter (Signed)
Left message for patient to call. My chart message has not been read yet.  ?

## 2021-06-17 NOTE — Telephone Encounter (Signed)
Patient scheduled on 06/22/21 ?

## 2021-06-20 ENCOUNTER — Encounter: Payer: Self-pay | Admitting: Obstetrics & Gynecology

## 2021-06-20 ENCOUNTER — Ambulatory Visit: Payer: 59 | Admitting: Obstetrics & Gynecology

## 2021-06-20 VITALS — BP 120/72

## 2021-06-20 DIAGNOSIS — E039 Hypothyroidism, unspecified: Secondary | ICD-10-CM | POA: Diagnosis not present

## 2021-06-20 MED ORDER — LEVOTHYROXINE SODIUM 50 MCG PO TABS: 50.0000 ug | ORAL_TABLET | Freq: Every day | ORAL | 2 refills | Status: DC

## 2021-06-20 NOTE — Progress Notes (Signed)
? ? ?  Kara Webster [birth year] [id number] ? ? ?     66 y.o.  G2P2L2  Same sex partner. ? ?RP: Counseling and management of new diagnosis of Hypothyroidism ? ?HPI: Feeling tired, fatigued and has depressive symptoms.  Also experiencing aching pains throughout her body.  Labs end of Feb/early March 2023 showed Hypothyroidism.  Postmenopause, well on HRT with Estradiol 0.5 mg 1/2 tab daily and Prometrium 100 mg HS.  No PMB.  No pelvic pain.  Walks her dogs. Healthy nutrition.   ? ? ?OB History  ?Gravida Para Term Preterm AB Living  ?'2 2       2  '$ ?SAB IAB Ectopic Multiple Live Births  ?           ?  ?# Outcome Date GA Lbr Len/2nd Weight Sex Delivery Anes PTL Lv  ?2 Para           ?1 Para           ? ? ?Past medical history,surgical history, problem list, medications, allergies, family history and social history were all reviewed and documented in the EPIC chart. ? ? ?Directed ROS with pertinent positives and negatives documented in the history of present illness/assessment and plan. ? ?Exam: ? ?Vitals:  ? 06/20/21 0855  ?BP: 120/72  ? ?General appearance:  Normal ? ?TSH on 05/09/2021 was high at 7.37 ?Thyroid panel completed 05/23/2021: ?FT3 low normal at 3.2 ?FT4 low at 0.7 ?TSI normal < 89 ? ? ?Assessment/Plan:  66 y.o. G2P2  ? ?1. Acquired hypothyroidism ?Feeling tired, fatigued and has depressive symptoms.  Also experiencing aching pains throughout her body.  Labs end of Feb/early March 2023 showed Hypothyroidism as above.  Postmenopause, well on HRT with Estradiol 0.5 mg 1/2 tab daily and Prometrium 100 mg HS.  No PMB.  No pelvic pain.  Walks her dogs. Healthy nutrition.  New Dx of Hypothyroidism.  TSI normal <89.  Appointment scheduled with Endocrino in 11/2021.  Will start treatment with Levothyroxine 0.05 mg PO daily.  Repeat TSH/FT4 in 1 month.  Also recommend establishing with a Fam MD. ?- TSH; Future in 1 month ?- T4, free; Future in 1 month ? ?Other orders ?- levothyroxine (SYNTHROID) 50 MCG tablet; Take 1 tablet  (50 mcg total) by mouth daily.  ? ?Counseling on new Dx of Hypothyroidism and its management, review of documentation for 25 minutes. ? ?Princess Bruins MD, 9:35 AM 06/20/2021 ? ? ? ?  ?

## 2021-07-13 ENCOUNTER — Ambulatory Visit: Payer: 59 | Admitting: Obstetrics & Gynecology

## 2021-07-21 NOTE — Telephone Encounter (Signed)
Patient scheduled on 0/26/23 with Dr.Shamleffer, Melanie Crazier, MD ?

## 2021-10-21 DIAGNOSIS — E039 Hypothyroidism, unspecified: Secondary | ICD-10-CM | POA: Diagnosis not present

## 2021-10-23 ENCOUNTER — Other Ambulatory Visit: Payer: Self-pay | Admitting: Psychiatry

## 2021-10-23 DIAGNOSIS — F3163 Bipolar disorder, current episode mixed, severe, without psychotic features: Secondary | ICD-10-CM

## 2021-10-27 DIAGNOSIS — Z08 Encounter for follow-up examination after completed treatment for malignant neoplasm: Secondary | ICD-10-CM | POA: Diagnosis not present

## 2021-10-27 DIAGNOSIS — L814 Other melanin hyperpigmentation: Secondary | ICD-10-CM | POA: Diagnosis not present

## 2021-10-27 DIAGNOSIS — Z85828 Personal history of other malignant neoplasm of skin: Secondary | ICD-10-CM | POA: Diagnosis not present

## 2021-10-27 DIAGNOSIS — L821 Other seborrheic keratosis: Secondary | ICD-10-CM | POA: Diagnosis not present

## 2021-10-27 DIAGNOSIS — Z789 Other specified health status: Secondary | ICD-10-CM | POA: Diagnosis not present

## 2021-11-10 ENCOUNTER — Ambulatory Visit: Payer: Self-pay | Admitting: Psychiatry

## 2021-11-17 ENCOUNTER — Ambulatory Visit: Payer: 59 | Admitting: Psychiatry

## 2021-11-17 ENCOUNTER — Encounter: Payer: Self-pay | Admitting: Psychiatry

## 2021-11-17 DIAGNOSIS — F32A Depression, unspecified: Secondary | ICD-10-CM | POA: Diagnosis not present

## 2021-11-17 DIAGNOSIS — F3163 Bipolar disorder, current episode mixed, severe, without psychotic features: Secondary | ICD-10-CM | POA: Diagnosis not present

## 2021-11-17 DIAGNOSIS — R69 Illness, unspecified: Secondary | ICD-10-CM | POA: Diagnosis not present

## 2021-11-17 DIAGNOSIS — F411 Generalized anxiety disorder: Secondary | ICD-10-CM

## 2021-11-17 MED ORDER — GABAPENTIN 300 MG PO CAPS: 300.0000 mg | ORAL_CAPSULE | Freq: Every day | ORAL | 1 refills | Status: DC

## 2021-11-17 MED ORDER — LITHIUM CARBONATE 300 MG PO CAPS: 900.0000 mg | ORAL_CAPSULE | Freq: Every day | ORAL | 1 refills | Status: DC

## 2021-11-17 MED ORDER — DULOXETINE HCL 20 MG PO CPEP: 40.0000 mg | ORAL_CAPSULE | Freq: Every day | ORAL | 1 refills | Status: DC

## 2021-11-17 MED ORDER — LAMOTRIGINE 200 MG PO TABS: 200.0000 mg | ORAL_TABLET | Freq: Every day | ORAL | 1 refills | Status: DC

## 2021-11-17 MED ORDER — RISPERIDONE 0.5 MG PO TABS: 0.5000 mg | ORAL_TABLET | Freq: Every day | ORAL | 1 refills | Status: DC

## 2021-11-17 MED ORDER — BUSPIRONE HCL 15 MG PO TABS: 15.0000 mg | ORAL_TABLET | Freq: Two times a day (BID) | ORAL | 1 refills | Status: DC

## 2021-11-17 NOTE — Progress Notes (Signed)
Kara Webster [id number] 1956/03/10 66 y.o.  Subjective:   Patient ID:  Kara Webster is a 66 y.o. (DOB [birth year]) female.  Chief Complaint:  Chief Complaint  Patient presents with   Depression   Anxiety    Depression        Past medical history includes anxiety.   Anxiety     Kara Webster presents to the office today for follow-up of anxiety, mood disturbance, and insomnia. She reports that her mood "depends... very up and down." She reports that she has had periods of depression. She has had issues with insects in her home from neighbors and her home has been treated. She has been staying elsewhere and this situation has caused her some distress. "Before that, I think it (mood) was pretty good." She reports that she has had some irritability and anger. She reports that a situation with her 65 yo granddaughter that has affected her mood. She reports that increase in Lithium has been helpful for her mood. Denies any recent impulsivity or risky behavior since reserving her trip. She reports that her anxiety is "there but it's manageable." Energy and motivation have been good. Appetite has been increased. She reports that she has gained some weight. She notices some weight gain when mood is more depressed and tends to lose weight when mood is more elevated. She reports concentration "has always been a struggle." She reports that she frequently forgets things. She reports that she had some SI during situation with granddaughter- "but it left."   She is working 3 days a week. Work has been stressful. Recently changed insurance and now has [employer].   Supportive partner.   She denies any recent ETOH use.   Past Psychiatric Medication Trials: Abilify- Caused tremors. Has not noticed any worsening s/s since stopping it. Cymbalta- Thinks this has been helpful for her depression. Has had severe discontinuation s/s with missed doses to the point of going to ER. Lamictal- Has been helpful for  mood. Reports taking long term. Gabapentin- Takes for RLS with good response. Wellbutrin- Had convulsion when taking while drinking heavily.  [place] Office Visit from 11/17/2021 in [place] Office Visit from 05/13/2021 in [facility]  AIMS Total Score 1 1        Review of Systems:  Review of Systems  Gastrointestinal:  Positive for constipation.  Musculoskeletal:  Negative for gait problem.  Neurological:        She reports occ hand tremor with increased anxiety and does not experience this daily.  Psychiatric/Behavioral:  Positive for depression.        Please refer to HPI    Medications: I have reviewed the patient's current medications.  Current Outpatient Medications  Medication Sig Dispense Refill   cholecalciferol (VITAMIN D3) 25 MCG (1000 UNIT) tablet Take 1,000 Units by mouth daily.     estradiol (ESTRACE) 0.5 MG tablet Take 0.5 tablets (0.25 mg total) by mouth daily. 45 tablet 4   levothyroxine (SYNTHROID) 50 MCG tablet Take 1 tablet (50 mcg total) by mouth daily. 60 tablet 2   progesterone (PROMETRIUM) 100 MG capsule Take 1 capsule (100 mg total) by mouth at bedtime. 90 capsule 4   busPIRone (BUSPAR) 15 MG tablet Take 1 tablet (15 mg total) by mouth 2 (two) times daily. 180 tablet 1   DULoxetine (CYMBALTA) 20 MG capsule Take 2 capsules (40 mg total) by mouth daily. 180 capsule 1  gabapentin (NEURONTIN) 300 MG capsule Take 1 capsule (300 mg total) by mouth at bedtime. 90 capsule 1   lamoTRIgine (LAMICTAL) 200 MG tablet Take 1 tablet (200 mg total) by mouth daily. 90 tablet 1   lithium carbonate 300 MG capsule Take 3 capsules (900 mg total) by mouth at bedtime. 270 capsule 1   risperiDONE (RISPERDAL) 0.5 MG tablet Take 1 tablet (0.5 mg total) by mouth at bedtime. 90 tablet 1   No current facility-administered medications for this visit.    Medication Side Effects: None  Allergies:  Allergies  Allergen  Reactions   Aripiprazole     Other reaction(s): dyskinesia   Codeine Nausea And Vomiting   Cetirizine Rash    Past Medical History:  Diagnosis Date   Depression    Hypothyroid    RLS (restless legs syndrome)    Squamous cell carcinoma of skin 10/18/2020   Mid Tip of Nose (in situ)    Past Medical History, Surgical history, Social history, and Family history were reviewed and updated as appropriate.   Please see review of systems for further details on the patient's review from today.   Objective:   Physical Exam:  There were no vitals taken for this visit.  Physical Exam Constitutional:      General: She is not in acute distress. Musculoskeletal:        General: No deformity.  Neurological:     Mental Status: She is alert and oriented to person, place, and time.     Coordination: Coordination normal.  Psychiatric:        Attention and Perception: Attention and perception normal. She does not perceive auditory or visual hallucinations.        Mood and Affect: Mood is depressed. Mood is not anxious. Affect is not labile, blunt, angry or inappropriate.        Speech: Speech normal.        Behavior: Behavior normal.        Thought Content: Thought content normal. Thought content is not paranoid or delusional. Thought content does not include homicidal or suicidal ideation. Thought content does not include homicidal or suicidal plan.        Cognition and Memory: Cognition and memory normal.        Judgment: Judgment normal.     Comments: Insight intact     Lab Review:     Component Value Date/Time   NA 145 05/09/2021 0845   K 4.9 05/09/2021 0845   CL 104 05/09/2021 0845   CO2 35 (H) 05/09/2021 0845   GLUCOSE 88 05/09/2021 0845   BUN 11 05/09/2021 0845   CREATININE 0.85 05/09/2021 0845   CALCIUM 10.3 05/09/2021 0845   PROT 6.9 05/09/2021 0845   ALBUMIN 4.2 10/27/2018 0256   AST 16 05/09/2021 0845   ALT 7 05/09/2021 0845   ALKPHOS 55 10/27/2018 0256   BILITOT 0.5  05/09/2021 0845   GFRNONAA >60 10/27/2018 0256   GFRAA >60 10/27/2018 0256       Component Value Date/Time   WBC 6.0 05/09/2021 0845   RBC 4.69 05/09/2021 0845   HGB 13.8 05/09/2021 0845   HCT 42.4 05/09/2021 0845   PLT 354 05/09/2021 0845   MCV 90.4 05/09/2021 0845   MCV 88.8 05/27/2013 1250   MCH 29.4 05/09/2021 0845   MCHC 32.5 05/09/2021 0845   RDW 11.8 05/09/2021 0845   LYMPHSABS 1.5 10/27/2018 0256   MONOABS 0.5 10/27/2018 0256   EOSABS 0.1 10/27/2018 0256  BASOSABS 0.0 10/27/2018 0256    No results found for: "POCLITH", "LITHIUM"   No results found for: "PHENYTOIN", "PHENOBARB", "VALPROATE", "CBMZ"   .res Assessment: Plan:    Pt seen for 30 minutes and time spent discussing response to increase in Lithium. She reports that overall increase in Lithium has been helpful for her mood symptoms and she would like to continue current medications without changes at this time. She reports that she recently had labs, to include Lithium level, drawn at her PCP's office. Will request copy of lab results from PCP's office.  Continue Lithium 600 mg po QHS for mood s/s.  Continue Buspar 15 mg po BID for anxiety.  Continue duloxetine 40 mg daily for mood and anxiety signs and symptoms. Continue lamotrigine 200 mg daily for mood signs and symptoms. Continue risperidone 0.5 mg at bedtime for mood signs and symptoms. Continue gabapentin 300 mg at bedtime for anxiety. Pt to follow-up in 6 months or sooner if clinically indicated.  Patient advised to contact office with any questions, adverse effects, or acute worsening in signs and symptoms.   Kara Webster was seen today for depression and anxiety.  Diagnoses and all orders for this visit:  Generalized anxiety disorder -     busPIRone (BUSPAR) 15 MG tablet; Take 1 tablet (15 mg total) by mouth 2 (two) times daily. -     DULoxetine (CYMBALTA) 20 MG capsule; Take 2 capsules (40 mg total) by mouth daily. -     gabapentin (NEURONTIN) 300  MG capsule; Take 1 capsule (300 mg total) by mouth at bedtime. -     lamoTRIgine (LAMICTAL) 200 MG tablet; Take 1 tablet (200 mg total) by mouth daily.  Depression, unspecified depression type -     DULoxetine (CYMBALTA) 20 MG capsule; Take 2 capsules (40 mg total) by mouth daily.  Bipolar disorder, current episode mixed, severe, without psychotic features ([place]) -     lamoTRIgine (LAMICTAL) 200 MG tablet; Take 1 tablet (200 mg total) by mouth daily. -     lithium carbonate 300 MG capsule; Take 3 capsules (900 mg total) by mouth at bedtime. -     risperiDONE (RISPERDAL) 0.5 MG tablet; Take 1 tablet (0.5 mg total) by mouth at bedtime.     Please see After Visit Summary for patient specific instructions.  Future Appointments  Date Time Provider [place]  05/18/2022 10:00 AM [name], PMHNP CP-CP None    No orders of the defined types were placed in this encounter.   -------------------------------

## 2021-12-01 ENCOUNTER — Telehealth: Payer: Self-pay | Admitting: Psychiatry

## 2021-12-01 NOTE — Telephone Encounter (Signed)
Kara Webster called today at 9:35 to get the name of the therapist [name] recommended to her at their last office visit. She said it was someone here in our office but lost the card that had the persons name.  Please call with name and to set appt.

## 2021-12-02 NOTE — Telephone Encounter (Signed)
Patient is wanting to see a therapist. [name] mentioned Gerald Stabs of [place]. Will you see about scheduling patient with one of them please.

## 2022-01-09 DIAGNOSIS — G629 Polyneuropathy, unspecified: Secondary | ICD-10-CM | POA: Diagnosis not present

## 2022-01-09 DIAGNOSIS — F411 Generalized anxiety disorder: Secondary | ICD-10-CM | POA: Diagnosis not present

## 2022-01-09 DIAGNOSIS — K5900 Constipation, unspecified: Secondary | ICD-10-CM | POA: Diagnosis not present

## 2022-01-09 DIAGNOSIS — R69 Illness, unspecified: Secondary | ICD-10-CM | POA: Diagnosis not present

## 2022-01-09 DIAGNOSIS — R32 Unspecified urinary incontinence: Secondary | ICD-10-CM | POA: Diagnosis not present

## 2022-01-09 DIAGNOSIS — E039 Hypothyroidism, unspecified: Secondary | ICD-10-CM | POA: Diagnosis not present

## 2022-01-09 DIAGNOSIS — Z87891 Personal history of nicotine dependence: Secondary | ICD-10-CM | POA: Diagnosis not present

## 2022-01-12 ENCOUNTER — Ambulatory Visit: Payer: Self-pay | Admitting: Internal Medicine

## 2022-02-03 ENCOUNTER — Ambulatory Visit (INDEPENDENT_AMBULATORY_CARE_PROVIDER_SITE_OTHER): Payer: 59 | Admitting: Psychiatry

## 2022-02-03 DIAGNOSIS — F1291 Cannabis use, unspecified, in remission: Secondary | ICD-10-CM

## 2022-02-03 DIAGNOSIS — H905 Unspecified sensorineural hearing loss: Secondary | ICD-10-CM

## 2022-02-03 DIAGNOSIS — F32A Depression, unspecified: Secondary | ICD-10-CM

## 2022-02-03 DIAGNOSIS — F411 Generalized anxiety disorder: Secondary | ICD-10-CM | POA: Diagnosis not present

## 2022-02-03 DIAGNOSIS — F1911 Other psychoactive substance abuse, in remission: Secondary | ICD-10-CM

## 2022-02-03 DIAGNOSIS — F1011 Alcohol abuse, in remission: Secondary | ICD-10-CM

## 2022-02-03 DIAGNOSIS — R69 Illness, unspecified: Secondary | ICD-10-CM | POA: Diagnosis not present
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
## 2022-03-09 ENCOUNTER — Ambulatory Visit: Payer: 59 | Admitting: Psychiatry

## 2022-03-09 DIAGNOSIS — R5383 Other fatigue: Secondary | ICD-10-CM | POA: Diagnosis not present

## 2022-03-09 DIAGNOSIS — F1911 Other psychoactive substance abuse, in remission: Secondary | ICD-10-CM

## 2022-03-09 DIAGNOSIS — F32A Depression, unspecified: Secondary | ICD-10-CM | POA: Diagnosis not present

## 2022-03-09 DIAGNOSIS — F411 Generalized anxiety disorder: Secondary | ICD-10-CM | POA: Diagnosis not present

## 2022-03-09 DIAGNOSIS — E039 Hypothyroidism, unspecified: Secondary | ICD-10-CM | POA: Diagnosis not present

## 2022-03-09 DIAGNOSIS — Z79899 Other long term (current) drug therapy: Secondary | ICD-10-CM | POA: Diagnosis not present

## 2022-03-09 NOTE — Progress Notes (Signed)
Psychotherapy Progress Note [facility] Luan Moore, PhD LP  Patient ID: Kara Webster)    MRN: [id number] Therapy format: Individual psychotherapy Date: 03/09/2022      Start: 3:04p     Stop: 3:50p     Time Spent: 46 min Location: In-person   Session narrative (presenting needs, interim history, self-report of stressors and symptoms, applications of prior therapy, status changes, and interventions made in session) Says she's been struggling with the delay to get back in the schedule, glad for a cancellation today.  Wants to address anxiety and anger.  Much stress focused with Liliana, as noted, finding herself getting too irritable, then having terrible shame attack after.  Feels she's just so automatic to react.  Acknowledges she may be too wired to compulsively manage Liliana's behavior, baited to overcontrol, too easily angered by her complaining and balking.    Framed better responses she might use under duress, and encouraged to practice them, either mentally or aloud before they're needed.  Things like "This is really frustrating.  What do you want instead?"  If possible, try to be empathy-forward, e.g., "Wow, girl, you sound frustrated.  What's up?"  Framed the value of trying to assume she's frustrated somewhere instead of trying to be bad, and pul out the child's feeling before trying to impose the parent's logic as the way to pull the plug on motivations for challenging behavior.  Acknowledged way different from how she was raised, and hard to make natural, but she already knows she wants to do better than she saw and not just shut down the girl the way she got done.  Another recommendation to "do-overs" -- when you don't like how you did, call a do-over on yourself, see if you have a more respectable version o a message you can put out, make sure you call it on yourself, and trust it's a positive object lesson for the child.  For the most exasperated moments,  modeled acknowledging frustration, e.g., "Liliana, I'm so mad right now I could ___ ... But ..." [ask her wishes, feelings, etc.].  Agrees, will practice.  Therapeutic modalities: Cognitive Behavioral Therapy, Solution-Oriented/Positive Psychology, and Assertiveness/Communication  Mental Status/Observations:  Appearance:   Casual     Behavior:  Appropriate  Motor:  Normal  Speech/Language:   Negative  Affect:  Appropriate  Mood:  dysthymic  Thought process:  normal  Thought content:    WNL and some worry/rumination  Sensory/Perceptual disturbances:    WNL  Orientation:  Fully oriented  Attention:  Good    Concentration:  Good  Memory:  WNL  Insight:    Fair  Judgment:   Good  Impulse Control:  Good   Risk Assessment: Danger to Self: No Self-injurious Behavior: No Danger to Others: No Physical Aggression / Violence: No Duty to Warn: No Access to Firearms a concern: No  Assessment of progress:  progressing  Diagnosis:   ICD-10-CM   1. Generalized anxiety disorder  F41.1     2. Depression, unspecified depression type  F32.A     3. History of substance abuse ([place])  F19.11      Plan:  Practice responses to Liliana's challenging behaviors and impulse to punish unfairly  As able, take notes on moods, identify self-talk patterns, and consider do-overs talking to herself, trying to be ass humane with herself as she would want her daughter to be to her granddaughter Per 1st visit, consider whether THC -- and secrecy -- are both  worth it, and either reveal or quit Will address self-soothing techniques to reduce overall frustration feelings that drive overreacting and self-medicating Other recommendations/advice as may be noted above Continue to utilize previously learned skills ad lib Maintain medication as prescribed and work faithfully with relevant prescriber(s) if any changes are desired or seem indicated Call the clinic on-call service, 988/hotline, 911, or present to [facility]  or ER if any life-threatening psychiatric crisis Return for as already scheduled, put on [place] list, avail earlier @ PT's need. Already scheduled visit in this office 04/06/2022.  Blanchie Serve, PhD Luan Moore, PhD LP Clinical Psychologist, [facility] [address] [place], [place] [address] [phone]

## 2022-03-11 ENCOUNTER — Other Ambulatory Visit: Payer: Self-pay | Admitting: Obstetrics & Gynecology

## 2022-03-15 NOTE — Telephone Encounter (Signed)
Medication refill request: estrace  Last AEX:  03/10/21 Next AEX: none scheduled. Note sent to pharmacy to advise patient no more refills until aex is scheduled.  Last MMG (if hormonal medication request): last mammo 2021  Refill authorized: #[id number] with 0 rf pended please advise.

## 2022-03-22 ENCOUNTER — Other Ambulatory Visit: Payer: Self-pay | Admitting: Obstetrics & Gynecology

## 2022-03-23 ENCOUNTER — Other Ambulatory Visit: Payer: Self-pay

## 2022-03-23 NOTE — Telephone Encounter (Signed)
Patient called requesting refill on Estradiol that was denied at pharmacy. She is past due for AEX and knows she probably  needs to schedule first.  Message sent to appt desk to call her and arrange AEX and then to let me know so I can forward her request to provider.

## 2022-03-23 NOTE — Telephone Encounter (Signed)
Webster, Kara  Kasir Hallenbeck R, RMA Left message for patient to call and schedule appointment. 

## 2022-03-27 NOTE — Telephone Encounter (Signed)
AEX is scheduled for 05/19/22.  Last AEX 03/10/21.  Request Estradiol refill.

## 2022-03-28 MED ORDER — ESTRADIOL 0.5 MG PO TABS
0.2500 mg | ORAL_TABLET | Freq: Every day | ORAL | 2 refills | Status: DC
Start: 2022-03-28 — End: 2022-12-28

## 2022-03-28 NOTE — Telephone Encounter (Addendum)
Princess Bruins, MD  to Me    03/28/22  9:05 AM Up to date with Mammo   Last Mammogram was 11/07/2019. I spoke with patient to inquire if she had one performed anywhere else but she has not. I explained estrogen increases breast cancer risk and important to stay current with mammograms especially when taking.   I provided patient with phone number to call to schedule mammo. She hopes this will not impact getting refill as she is completely out of estradiol tabs.

## 2022-03-30 ENCOUNTER — Other Ambulatory Visit: Payer: Self-pay | Admitting: Obstetrics & Gynecology

## 2022-03-30 DIAGNOSIS — Z1231 Encounter for screening mammogram for malignant neoplasm of breast: Secondary | ICD-10-CM

## 2022-04-06 ENCOUNTER — Ambulatory Visit: Payer: 59 | Admitting: Psychiatry

## 2022-04-06 DIAGNOSIS — F411 Generalized anxiety disorder: Secondary | ICD-10-CM | POA: Diagnosis not present

## 2022-04-06 DIAGNOSIS — F1911 Other psychoactive substance abuse, in remission: Secondary | ICD-10-CM

## 2022-04-06 DIAGNOSIS — F341 Dysthymic disorder: Secondary | ICD-10-CM | POA: Diagnosis not present

## 2022-04-06 NOTE — Progress Notes (Signed)
Psychotherapy Progress Note [facility] Luan Moore, PhD LP  Patient ID: SAMADHI GODING)    MRN: [id number] Therapy format: Individual psychotherapy Date: 04/06/2022      Start: 1:08p     Stop: 1:55p     Time Spent: 47 min Location: In-person   Session narrative (presenting needs, interim history, self-report of stressors and symptoms, applications of prior therapy, status changes, and interventions made in session) Some success in short visits with Liliana lately, nothing extended yet that might overtax her.    Top issue today is regrets of various kinds, including a falling out with sister Collie Siad.  Understood to be an impasse over whether sister will associate with Radhika's children at all based on a confrontation between their children.  Hx of niece Hal Hope being very demanding of attention and raining on daughter Brantley Stage parade at the wedding.  Alver Fisher confronted, and Candace stayed through, but alleged some grave injustice to her mother, and Collie Siad trying to get V to take responsibility for Brantley Stage allegedly unfair actions.  This led to years of silence between them, until birth of grandchildren.  These days, S will send V greetings for occasions but not broach subjects or probe family relations in any way.  When granddaughter Tim Lair asked about Quenten Raven felt convicted and ashamed of the situation (3 years ago, now) and feeling tortured some about what's the right thing to do.  Back story that sister has been long jealous, and preoccupied with her own image.  Validated and framed true regret as a call to put actions and values back in line with each other.  Challenged to consider that either one could take the next step if she could take the first step with Collie Siad to just say she misses her, ask if there is still any barrier on Sue's part to letting their kids associate.  Denies her pride is in the way, though she is prone to forecast what she'll get back and want automatically  to resist giving the satisfaction.  Reaffirmed her actual power to set a better tone if she can subdue that and invite more than react.  Admits she has felt she sold herself out far too long in life, and that created her alienating, angry way as much as anything.  Other regrets alienating her partner Susan's family a couple years ago, and getting fired from her job 2 years ago, both under heavier influence of alcohol and drugs.  Validated and reaffirmed she has gotten away from what led her so badly, can get to making amends as interested.  Therapeutic modalities: Cognitive Behavioral Therapy, Solution-Oriented/Positive Psychology, and Faith-sensitive  Mental Status/Observations:  Appearance:   Casual     Behavior:  Appropriate  Motor:  Normal  Speech/Language:   Clear and Coherent  Affect:  Appropriate  Mood:  subdued  Thought process:  normal  Thought content:    WNL  Sensory/Perceptual disturbances:    WNL  Orientation:  Fully oriented  Attention:  Good    Concentration:  Good  Memory:  WNL  Insight:    Good  Judgment:   Good  Impulse Control:  Good   Risk Assessment: Danger to Self: No Self-injurious Behavior: No Danger to Others: No Physical Aggression / Violence: No Duty to Warn: No Access to Firearms a concern: No  Assessment of progress:  progressing  Diagnosis:   ICD-10-CM   1. Generalized anxiety disorder  F41.1     2. 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 History of substance abuse ([place])  F19.11      Plan:  Resolved to reach out to Collie Siad, make a unilateral offer, either acknowledging missing her, acknowledging gratitude for helping take care of her growing up, or wanting to work harder at seeing each other.  Options to add apology for past stoniness, ask her to consider opening up to seeing the other family members, explicitly no need on V's part to pursue any old grievances, but take the moment, let herself be vulnerable, and just offer, without counting on any  particular response. Come back to other regrets at interest Practice responses to Liliana's challenging behaviors and the impulse to punish unfairly  As able, take notes on moods, identify self-talk patterns, and consider do-overs talking to herself, trying to be ass humane with herself as she would want her daughter to be to her granddaughter Per 1st visit, consider whether THC -- and secrecy -- are both worth it, and either reveal or quit Will address self-soothing techniques to reduce overall frustration feelings that drive overreacting and self-medicating Other recommendations/advice as may be noted above Continue to utilize previously learned skills ad lib Maintain medication as prescribed and work faithfully with relevant prescriber(s) if any changes are desired or seem indicated Call the clinic on-call service, 988/hotline, 911, or present to [facility] or ER if any life-threatening psychiatric crisis Return for as already scheduled, avail earlier @ PT's need. Already scheduled visit in this office 05/18/2022.  Blanchie Serve, PhD Luan Moore, PhD LP Clinical Psychologist, [facility] [address] [place], [place] [address] [phone]

## 2022-05-15 DIAGNOSIS — M542 Cervicalgia: Secondary | ICD-10-CM | POA: Diagnosis not present

## 2022-05-18 ENCOUNTER — Encounter: Payer: Self-pay | Admitting: Psychiatry

## 2022-05-18 ENCOUNTER — Telehealth (INDEPENDENT_AMBULATORY_CARE_PROVIDER_SITE_OTHER): Payer: 59 | Admitting: Psychiatry

## 2022-05-18 ENCOUNTER — Ambulatory Visit: Payer: 59 | Admitting: Psychiatry

## 2022-05-18 DIAGNOSIS — F3163 Bipolar disorder, current episode mixed, severe, without psychotic features: Secondary | ICD-10-CM | POA: Diagnosis not present

## 2022-05-18 DIAGNOSIS — Z79899 Other long term (current) drug therapy: Secondary | ICD-10-CM | POA: Diagnosis not present

## 2022-05-18 DIAGNOSIS — F32A Depression, unspecified: Secondary | ICD-10-CM

## 2022-05-18 DIAGNOSIS — F411 Generalized anxiety disorder: Secondary | ICD-10-CM | POA: Diagnosis not present

## 2022-05-18 MED ORDER — BUSPIRONE HCL 15 MG PO TABS: 15.0000 mg | ORAL_TABLET | Freq: Two times a day (BID) | ORAL | 1 refills | Status: DC

## 2022-05-18 MED ORDER — RISPERIDONE 0.25 MG PO TABS: 0.2500 mg | ORAL_TABLET | Freq: Every day | ORAL | 1 refills | Status: DC

## 2022-05-18 MED ORDER — GABAPENTIN 300 MG PO CAPS: 300.0000 mg | ORAL_CAPSULE | Freq: Every day | ORAL | 1 refills | Status: DC

## 2022-05-18 MED ORDER — LITHIUM CARBONATE 300 MG PO CAPS: 900.0000 mg | ORAL_CAPSULE | Freq: Every day | ORAL | 0 refills | Status: DC

## 2022-05-18 MED ORDER — DULOXETINE HCL 20 MG PO CPEP: 40.0000 mg | ORAL_CAPSULE | Freq: Every day | ORAL | 1 refills | Status: DC

## 2022-05-18 MED ORDER — LAMOTRIGINE 200 MG PO TABS: 200.0000 mg | ORAL_TABLET | Freq: Every day | ORAL | 1 refills | Status: DC

## 2022-05-18 NOTE — Progress Notes (Signed)
BAHATI ZETTEL [id number] 1955-07-09 67 y.o.  Virtual Visit via Video Note  I connected with pt @ on 05/18/22 at 10:00 AM EST by a video enabled telemedicine application and verified that I am speaking with the correct person using two identifiers.   I discussed the limitations of evaluation and management by telemedicine and the availability of in person appointments. The patient expressed understanding and agreed to proceed.  I discussed the assessment and treatment plan with the patient. The patient was provided an opportunity to ask questions and all were answered. The patient agreed with the plan and demonstrated an understanding of the instructions.   The patient was advised to call back or seek an in-person evaluation if the symptoms worsen or if the condition fails to improve as anticipated.  I provided 30 minutes of non-face-to-face time during this encounter.  The patient was located at home.  The provider was located at home.   [name], PMHNP   Subjective:   Patient ID:  Kara Webster is a 67 y.o. (DOB 1955/11/21) female.  Chief Complaint:  Chief Complaint  Patient presents with   Medication Problem    Tardive dyskinesia    HPI DELESHIA BRAUSE presents for follow-up of bipolar disorder, anxiety, and history of substance abuse.   She reports that she is experiencing some possible TD- "not nearly as bad as it was." She notices clinching in her jaw and tremors, especially in her Left upper extremity.   She reports difficulty with concentration- "I can't focus at all." She reports that she has to read things again and will lose her train of thought while praying. Easily distracted and difficulty completing tasks.   Denies depressed mood- "I think I have been doing exceptionally well... I've actually been pretty good." She reports some impulsivity- "I just want to call out things" and "saying things that are kind of rude." Denies excessive spending. She reports that  she gets angry at work. Some increased irritability. 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 She reports some anxiety at work and anticipatory anxiety before going into work. 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 Sleeping well. Appetite has been good. Denies SI.   Denies any recent ETOH or substance use.  She reports having some recent plumbing issues.   Past Psychiatric Medication Trials: Abilify- Caused tremors. Has not noticed any worsening s/s since stopping it. Risperidone Cymbalta- Thinks this has been helpful for her depression. Has had severe discontinuation s/s with missed doses to the point of going to ER. Lamictal- Has been helpful for mood. Reports taking long term. Gabapentin- Takes for RLS with good response. Wellbutrin- Had convulsion when taking while drinking heavily.  Buspar     Review of Systems:  Review of Systems  Musculoskeletal:  Negative for gait problem.  Neurological:  Positive for tremors.  Psychiatric/Behavioral:         Please refer to HPI    Medications: I have reviewed the patient's current medications.  Current Outpatient Medications  Medication Sig Dispense Refill   cholecalciferol (VITAMIN D3) 25 MCG (1000 UNIT) tablet Take 1,000 Units by mouth daily.     estradiol (ESTRACE) 0.5 MG tablet Take 0.5 tablets (0.25 mg total) by mouth daily. 45 tablet 2   levothyroxine (SYNTHROID) 50 MCG tablet Take 1 tablet (50 mcg total)  by mouth daily. 60 tablet 2   Multiple Vitamin (MULTIVITAMIN) tablet Take 1 tablet by mouth daily.     progesterone (PROMETRIUM) 100 MG capsule Take 1 capsule (100 mg total) by mouth at bedtime. 90 capsule 4   busPIRone (BUSPAR) 15 MG tablet Take 1 tablet (15 mg total) by mouth 2 (two) times daily. 180 tablet 1   DULoxetine (CYMBALTA) 20 MG capsule Take 2  capsules (40 mg total) by mouth daily. 180 capsule 1   gabapentin (NEURONTIN) 300 MG capsule Take 1 capsule (300 mg total) by mouth at bedtime. 90 capsule 1   lamoTRIgine (LAMICTAL) 200 MG tablet Take 1 tablet (200 mg total) by mouth daily. 90 tablet 1   lithium carbonate 300 MG capsule Take 3 capsules (900 mg total) by mouth at bedtime. 270 capsule 0   risperiDONE (RISPERDAL) 0.25 MG tablet Take 1 tablet (0.25 mg total) by mouth at bedtime. 90 tablet 1   No current facility-administered medications for this visit.    Medication Side Effects: Other: TD (jaw, leg, hand tremor)  Allergies:  Allergies  Allergen Reactions   Aripiprazole     Other reaction(s): dyskinesia   Codeine Nausea And Vomiting   Cetirizine Rash    Past Medical History:  Diagnosis Date   Depression    Hypothyroid    RLS (restless legs syndrome)    Squamous cell carcinoma of skin 10/18/2020   Mid Tip of Nose (in situ)    Family History  Problem Relation Age of Onset   Cancer Mother        leukemia    Hypertension Mother    Cancer Father        lung    Alcohol abuse Father    Diabetes Sister    Hyperlipidemia Sister    Hypertension Sister    Depression Brother    Diabetes Brother    Hypertension Brother    Alcohol abuse Maternal Uncle    Alcohol abuse Paternal Aunt    Alcohol abuse Maternal Grandfather    Alcohol abuse Brother     Social History   Socioeconomic History   Marital status: Divorced    Spouse name: Not on file   Number of children: Not on file   Years of education: Not on file   Highest education level: Not on file  Occupational History   Not on file  Tobacco Use   Smoking status: Former    Types: Cigarettes    Quit date: 06/01/1996    Years since quitting: 25.9   Smokeless tobacco: Never  Vaping Use   Vaping Use: Never used  Substance and Sexual Activity   Alcohol use: Not Currently   Drug use: Not Currently   Sexual activity: Not Currently    Partners: Male    Birth  control/protection: Post-menopausal    Comment: 1st intercourse- 43, partners- greater than 5  Other Topics Concern   Not on file  Social History Narrative   Not on file   Social Determinants of Health   Financial Resource Strain: Not on file  Food Insecurity: Not on file  Transportation Needs: Not on file  Physical Activity: Not on file  Stress: Not on file  Social Connections: Not on file  Intimate Partner Violence: Not on file    Past Medical History, Surgical history, Social history, and Family history were reviewed and updated as appropriate.   Please see review of systems for further details on the patient's review from today.   Objective:  Physical Exam:  There were no vitals taken for this visit.  Physical Exam Constitutional:      General: She is not in acute distress. Musculoskeletal:        General: No deformity.  Neurological:     Mental Status: She is alert and oriented to person, place, and time.     Coordination: Coordination normal.  Psychiatric:        Attention and Perception: Attention and perception normal. She does not perceive auditory or visual hallucinations.        Mood and Affect: Mood normal. Mood is not anxious or depressed. Affect is not labile, blunt, angry or inappropriate.        Speech: Speech normal.        Behavior: Behavior normal.        Thought Content: Thought content normal. Thought content is not paranoid or delusional. Thought content does not include homicidal or suicidal ideation. Thought content does not include homicidal or suicidal plan.        Cognition and Memory: Cognition and memory normal.        Judgment: Judgment normal.     Comments: Insight intact     Lab Review:     Component Value Date/Time   NA 145 05/09/2021 0845   K 4.9 05/09/2021 0845   CL 104 05/09/2021 0845   CO2 35 (H) 05/09/2021 0845   GLUCOSE 88 05/09/2021 0845   BUN 11 05/09/2021 0845   CREATININE 0.85 05/09/2021 0845   CALCIUM 10.3  05/09/2021 0845   PROT 6.9 05/09/2021 0845   ALBUMIN 4.2 10/27/2018 0256   AST 16 05/09/2021 0845   ALT 7 05/09/2021 0845   ALKPHOS 55 10/27/2018 0256   BILITOT 0.5 05/09/2021 0845   GFRNONAA >60 10/27/2018 0256   GFRAA >60 10/27/2018 0256       Component Value Date/Time   WBC 6.0 05/09/2021 0845   RBC 4.69 05/09/2021 0845   HGB 13.8 05/09/2021 0845   HCT 42.4 05/09/2021 0845   PLT 354 05/09/2021 0845   MCV 90.4 05/09/2021 0845   MCV 88.8 05/27/2013 1250   MCH 29.4 05/09/2021 0845   MCHC 32.5 05/09/2021 0845   RDW 11.8 05/09/2021 0845   LYMPHSABS 1.5 10/27/2018 0256   MONOABS 0.5 10/27/2018 0256   EOSABS 0.1 10/27/2018 0256   BASOSABS 0.0 10/27/2018 0256    No results found for: "POCLITH", "LITHIUM"   No results found for: "PHENYTOIN", "PHENOBARB", "VALPROATE", "CBMZ"   .res Assessment: Plan:    Pt seen for 30 minutes and time spent discussing her concern about tardive dyskinesia, particularly since 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 Discussed attempting dose reduction in Risperdal from 0.5 mg to 0.25 mg at bedtime to possibly improve TD. Discussed considering future discontinuation of Risperdal if she does not have worsening mood symptoms with dose reduction, or considering another treatment option for mood stabilization if mood worsens with dose reduction.  Recommended obtaining Lithium and creatinine levels to determine if increasing Lithium may be a possible treatment option in place of current dose of Risperdal. Discussed that creatinine level is also recommended to rule out adverse effects on kidney function. She requests that Lab orders be sent to [facility] and she plans to contact lab and/or insurance to determine coverage prior to having labs drawn. If she learns that [facility]anda is in network and [facility] is out of network, pt advised pt to contact office to request lab order to be sent  to [facility].  Will continue Lithium 900 mg po QHS at bedtime for mood  stabilization.  Continue Lamictal 200 mg po qd for mood stabilization.  Continue Gabapentin 300 mg po QHS for insomnia and anxiety.  Continue Buspar 15 mg po BID for anxiety.  Continue Cymbalta 40 mg po qd for anxiety and depression.  Recommend continuing psychotherapy with Luan Moore, PhD.  Pt to follow-up in 3 months or sooner if clinically indicated.  Patient advised to contact office with any questions, adverse effects, or acute worsening in signs and symptoms.    Darlinda was seen today for medication problem.  Diagnoses and all orders for this visit:  Bipolar disorder, current episode mixed, severe, without psychotic features ([place]) -     risperiDONE (RISPERDAL) 0.25 MG tablet; Take 1 tablet (0.25 mg total) by mouth at bedtime. -     lamoTRIgine (LAMICTAL) 200 MG tablet; Take 1 tablet (200 mg total) by mouth daily. -     lithium carbonate 300 MG capsule; Take 3 capsules (900 mg total) by mouth at bedtime.  Generalized anxiety disorder -     busPIRone (BUSPAR) 15 MG tablet; Take 1 tablet (15 mg total) by mouth 2 (two) times daily. -     lamoTRIgine (LAMICTAL) 200 MG tablet; Take 1 tablet (200 mg total) by mouth daily. -     gabapentin (NEURONTIN) 300 MG capsule; Take 1 capsule (300 mg total) by mouth at bedtime. -     DULoxetine (CYMBALTA) 20 MG capsule; Take 2 capsules (40 mg total) by mouth daily.  High risk medication use -     Lithium level -     Creatinine, serum  Depression, unspecified depression type -     DULoxetine (CYMBALTA) 20 MG capsule; Take 2 capsules (40 mg total) by mouth daily.     Please see After Visit Summary for patient specific instructions.  Future Appointments  Date Time Provider [place]  05/25/2022  5:00 PM [facility] MM 2 GI-BCGMM GI-BREAST CE  06/05/2022 10:00 AM Blanchie Serve, PhD CP-CP None  06/08/2022  3:30 PM Princess Bruins, MD GCG-GCG None  06/29/2022  1:00 PM Blanchie Serve, PhD CP-CP None    Orders Placed This Encounter   Procedures   Lithium level   Creatinine, serum      -------------------------------

## 2022-05-19 ENCOUNTER — Ambulatory Visit: Payer: Self-pay | Admitting: Obstetrics & Gynecology

## 2022-05-22 ENCOUNTER — Ambulatory Visit: Payer: Self-pay

## 2022-05-24 ENCOUNTER — Other Ambulatory Visit: Payer: Self-pay | Admitting: Obstetrics & Gynecology

## 2022-05-24 ENCOUNTER — Other Ambulatory Visit: Payer: Self-pay | Admitting: Psychiatry

## 2022-05-24 DIAGNOSIS — F3163 Bipolar disorder, current episode mixed, severe, without psychotic features: Secondary | ICD-10-CM

## 2022-05-25 ENCOUNTER — Ambulatory Visit: Payer: Self-pay

## 2022-05-29 DIAGNOSIS — Z79899 Other long term (current) drug therapy: Secondary | ICD-10-CM | POA: Diagnosis not present

## 2022-05-30 LAB — LITHIUM LEVEL: Lithium Lvl: 0.9 mmol/L (ref 0.5–1.2)

## 2022-05-30 LAB — CREATININE, SERUM
Creatinine, Ser: 0.93 mg/dL (ref 0.57–1.00)
eGFR: 69 mL/min/{1.73_m2} (ref >59)

## 2022-06-01 DIAGNOSIS — Z780 Asymptomatic menopausal state: Secondary | ICD-10-CM | POA: Diagnosis not present

## 2022-06-01 DIAGNOSIS — G2581 Restless legs syndrome: Secondary | ICD-10-CM | POA: Diagnosis not present

## 2022-06-01 DIAGNOSIS — K5900 Constipation, unspecified: Secondary | ICD-10-CM | POA: Diagnosis not present

## 2022-06-01 DIAGNOSIS — F419 Anxiety disorder, unspecified: Secondary | ICD-10-CM | POA: Diagnosis not present

## 2022-06-01 DIAGNOSIS — Z87891 Personal history of nicotine dependence: Secondary | ICD-10-CM | POA: Diagnosis not present

## 2022-06-01 DIAGNOSIS — R030 Elevated blood-pressure reading, without diagnosis of hypertension: Secondary | ICD-10-CM | POA: Diagnosis not present

## 2022-06-01 DIAGNOSIS — F319 Bipolar disorder, unspecified: Secondary | ICD-10-CM | POA: Diagnosis not present

## 2022-06-01 DIAGNOSIS — Z85828 Personal history of other malignant neoplasm of skin: Secondary | ICD-10-CM | POA: Diagnosis not present

## 2022-06-01 DIAGNOSIS — Z8249 Family history of ischemic heart disease and other diseases of the circulatory system: Secondary | ICD-10-CM | POA: Diagnosis not present

## 2022-06-01 DIAGNOSIS — M810 Age-related osteoporosis without current pathological fracture: Secondary | ICD-10-CM | POA: Diagnosis not present

## 2022-06-01 DIAGNOSIS — R32 Unspecified urinary incontinence: Secondary | ICD-10-CM | POA: Diagnosis not present

## 2022-06-01 DIAGNOSIS — G629 Polyneuropathy, unspecified: Secondary | ICD-10-CM | POA: Diagnosis not present

## 2022-06-05 ENCOUNTER — Ambulatory Visit (INDEPENDENT_AMBULATORY_CARE_PROVIDER_SITE_OTHER): Payer: Self-pay | Admitting: Psychiatry

## 2022-06-05 DIAGNOSIS — Z91199 Patient's noncompliance with other medical treatment and regimen due to unspecified reason: Secondary | ICD-10-CM

## 2022-06-05 NOTE — Progress Notes (Deleted)
Psychotherapy Progress Note [facility] Luan Moore, PhD LP  Patient ID: Kara Webster)    MRN: [id number] Therapy format: {Therapy Types:21967::"Individual psychotherapy"} Date: 06/05/2022      Start: ***:***     Stop: ***:***     Time Spent: *** min Location: {SvcLoc:22530::"In-person"}   Session narrative (presenting needs, interim history, self-report of stressors and symptoms, applications of prior therapy, status changes, and interventions made in session) ***  Therapeutic modalities: {AM:23362::"Cognitive Behavioral Therapy","Solution-Oriented/Positive Psychology"}  Mental Status/Observations:  Appearance:   {PSY:22683}     Behavior:  {PSY:21022743}  Motor:  {PSY:22302}  Speech/Language:   {PSY:22685}  Affect:  {PSY:22687}  Mood:  {PSY:31886}  Thought process:  {PSY:31888}  Thought content:    {[phone]}  Sensory/Perceptual disturbances:    {PSY:[phone]}  Orientation:  {Psych Orientation:23301::"Fully oriented"}  Attention:  {Good-Fair-Poor ratings:23770::"Good"}    Concentration:  {Good-Fair-Poor ratings:23770::"Good"}  Memory:  {PSY:[phone]}  Insight:    {Good-Fair-Poor ratings:23770::"Good"}  Judgment:   {Good-Fair-Poor ratings:23770::"Good"}  Impulse Control:  {Good-Fair-Poor ratings:23770::"Good"}   Risk Assessment: Danger to Self: {Risk:22599::"No"} Self-injurious Behavior: {Risk:22599::"No"} Danger to Others: {Risk:22599::"No"} Physical Aggression / Violence: {Risk:22599::"No"} Duty to Warn: {AMYesNo:22526::"No"} Access to Firearms a concern: {AMYesNo:22526::"No"}  Assessment of progress:  {Progress:22147::"progressing"}  Diagnosis: No diagnosis found. Plan:  *** Other recommendations/advice as may be noted above Continue to utilize previously learned skills ad lib Maintain medication as prescribed and work faithfully with relevant prescriber(s) if any changes are desired or seem indicated Call the clinic  on-call service, 988/hotline, 911, or present to [facility] or ER if any life-threatening psychiatric crisis No follow-ups on file. Already scheduled visit in this office 06/29/2022.  Blanchie Serve, PhD Luan Moore, PhD LP Clinical Psychologist, [facility] [address] [place], [place] [address] [phone]

## 2022-06-05 NOTE — Progress Notes (Signed)
Admin note for non-service contact  Patient ID: Kara Webster  MRN: [id number] DATE: 06/05/2022  No show for 10am session.  Charge normally.  Blanchie Serve, PhD Luan Moore, PhD LP Clinical Psychologist, [facility] [address] [place], [place] [address] [phone]

## 2022-06-08 ENCOUNTER — Ambulatory Visit: Payer: Self-pay | Admitting: Obstetrics & Gynecology

## 2022-06-13 DIAGNOSIS — B349 Viral infection, unspecified: Secondary | ICD-10-CM | POA: Diagnosis not present

## 2022-06-13 DIAGNOSIS — Z03818 Encounter for observation for suspected exposure to other biological agents ruled out: Secondary | ICD-10-CM | POA: Diagnosis not present

## 2022-06-13 DIAGNOSIS — R0981 Nasal congestion: Secondary | ICD-10-CM | POA: Diagnosis not present

## 2022-06-13 DIAGNOSIS — J029 Acute pharyngitis, unspecified: Secondary | ICD-10-CM | POA: Diagnosis not present

## 2022-06-29 ENCOUNTER — Ambulatory Visit (INDEPENDENT_AMBULATORY_CARE_PROVIDER_SITE_OTHER): Payer: 59 | Admitting: Psychiatry

## 2022-06-29 DIAGNOSIS — H905 Unspecified sensorineural hearing loss: Secondary | ICD-10-CM | POA: Diagnosis not present

## 2022-06-29 DIAGNOSIS — F1911 Other psychoactive substance abuse, in remission: Secondary | ICD-10-CM

## 2022-06-29 DIAGNOSIS — F3132 Bipolar disorder, current episode depressed, moderate: Secondary | ICD-10-CM

## 2022-06-29 DIAGNOSIS — F411 Generalized anxiety disorder: Secondary | ICD-10-CM | POA: Diagnosis not present

## 2022-06-29 NOTE — Progress Notes (Signed)
Psychotherapy Progress Note [facility] Marliss Czar, PhD LP  Patient ID: Kara Webster)    MRN: [id number] Therapy format: Individual psychotherapy Date: 06/29/2022      Start: 1:10p     Stop: 2:00p     Time Spent: 50 min Location: In-person   Session narrative (presenting needs, interim history, self-report of stressors and symptoms, applications of prior therapy, status changes, and interventions made in session) NS 3 weeks ago, last seen before that in January.  Surprised to learn she missed.  "I'm not good" lately, worse memory, worse self-esteem.  Gets fuzzy which floor to come to in this building, she says.  Doesn't recall taking $6500 out.  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  May forget to take a patient's earrings out before xrays, though.  Slowing down enough in her work that one patient left.  May forget to schedule patient f/u appts.  Last night, left a door open, keys in it, wallet by the door.  No clear evidence of sleep disorder, no at-home observations of anything identifiable.  She has been concerned about tremors, but her creatinine is normal.  Thyroid test was borderline abnormal but declined to see endocrinologist.  Discussed possible sleep apnea.  Does not worry she's going to do something harmful, somehow, but nothing identifiable, which could signal just her internal sense of alarm from something else physiological.  Left hand tremor (more than right) is very bothersome.  Mouth movements have let up but still happen, consistent with TD.  She noticed on video as recently as February.  Has worried she may lose her job over these things, but Dr. Littie Deeds has not mentioned anything.  Front office, however, is letting her know she's missing things, like scheduling, and she's having to beg  Is picking up some irritability from colleagues.    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  Says she has written down the panoramic xray process already.  Suggestion made to use an eye-catching sticker that would alert her at the right time to earrings.  Mouth movements becoming noticeable to herself again despite reducing Risperdal 6 wks ago, but it doesn't seem to be all about TD.  Explored sleep apnea as a stealth factor, but B vitamin deficiencies are also consistent with the low energy, fatigue, foggyheadedness, memory, and tremor issues, all.  Recommended she try adding a B complex to her MVI, stick with the reduced Risperdal, get back on psychiatry schedule (no appts made for some time), and see later if a home sleep test is warranted.  Meanwhile, memory aids at work -- a cue card for how to run the [employer], and a fluorescent dot on the xray machine to remind her to take patient's earrings out -- and willingness to acknowledge to her supervising dentist and colleagues that she is working through a medical problem and thanks for heir patience having to assist her so often.  Therapeutic modalities: Cognitive Behavioral Therapy and Solution-Oriented/Positive Psychology  Mental Status/Observations:  Appearance:   Casual     Behavior:  Appropriate  Motor:  Normal  Speech/Language:   Clear and Coherent  Affect:  Appropriate  Mood:  anxious and dysthymic  Thought process:  normal  Thought content:    WNL and worry  Sensory/Perceptual disturbances:    WNL  Orientation:  Fully oriented  Attention:  Good    Concentration:  Good  Memory:  Reported lapses  Insight:    Fair  Judgment:   Good  Impulse Control:  Good   Risk Assessment: Danger to Self: No Self-injurious Behavior: No Danger to Others: No Physical Aggression / Violence: No Duty to Warn: No Access to Firearms a concern: No  Assessment of progress:  stabilized  Diagnosis:   ICD-10-CM   1. Bipolar affective disorder, currently depressed, moderate (HCC)  F31.32     2.  Generalized anxiety disorder  F41.1     3. History of substance abuse (HCC)  F19.11     4. Sensorineural hearing loss (SNHL), unspecified laterality  H90.5      Plan:  Cognitive/neuro complaints -- Continue to assess.  Stick with reduced Risperdal in c/o TD, and get back on psychiatry schedule.  See later if home sleep test is warranted.  For suspected vitamin deficiency/need, try adding a B complex to her MVI.  For memory aids at work, consider a cue card for how to run the pano xray, and a fluorescent dot or something similar on the xray machine to remind her to remove jewelry.  Be willing to acknowledge to her supervising dentist and colleagues that she is working through a medical problem, and thanks for heir patience having to assist her so often. Sister estrangement -- Resolved to reach out to Fannie Knee, make a unilateral offer, either acknowledging missing her, acknowledging gratitude for helping take care of her growing up, or wanting to work harder at seeing each other.  Options to add apology for past stoniness, ask her to consider opening up to seeing the other family members, with firm decision on V's part no need to pursue any old grievances, just offer, take the moment, let herself be vulnerable, don't count on any particular response. Anger management (esp re Liliana) -- Practice prior responses to challenging behaviors and the impulse to punish unfairly.  As able, take notes on moods, identify self-talk patterns, and consider do-overs talking to herself, trying to be as humane with herself as she would want her daughter to be to her granddaughter Other regret/guilt -- May discuss at need [facility] -- Per 1st visit, consider whether THC -- and secrecy -- are both worth it, and either reveal or quit Self-soothing skills -- Address techniques as able to reduce overall frustration feelings that drive overreacting and self-medicating Other recommendations/advice as may be noted above Continue to  utilize previously learned skills ad lib Maintain medication as prescribed and work faithfully with relevant prescriber(s) if any changes are desired or seem indicated Call the clinic on-call service, 988/hotline, 911, or present to [facility] or ER if any life-threatening psychiatric crisis Return in about 1 month (around 07/29/2022) for needs reschedule with prescriber. Already scheduled visit in this office Visit date not found.  Robley Fries, PhD Marliss Czar, PhD LP Clinical Psychologist, [facility] [address] [place], [place] [address] [phone]

## 2022-06-30 ENCOUNTER — Telehealth: Payer: Self-pay | Admitting: Psychiatry

## 2022-06-30 NOTE — Telephone Encounter (Signed)
LVM to RC.   From 2/29 visit:  Discussed attempting dose reduction in Risperdal from 0.5 mg to 0.25 mg at bedtime to possibly improve TD. Discussed considering future discontinuation of Risperdal if she does not have worsening mood symptoms with dose reduction, or considering another treatment option for mood stabilization if mood worsens with dose reduction.

## 2022-06-30 NOTE — Telephone Encounter (Signed)
Pt called at 11:12a.  She said the Risperadone was helping some, but not much with tremors.  She wants to come off of it.    No upcoming appt scheduled.

## 2022-07-03 NOTE — Telephone Encounter (Signed)
Please advise her to discontinue and to make an apt if symptoms worsen, so that alternatives can be discussed.

## 2022-07-03 NOTE — Telephone Encounter (Signed)
Patient notified of recommendations. 

## 2022-07-03 NOTE — Telephone Encounter (Signed)
Called patient again. Patient called last week to say that she has noticed some improvement with Risperdal, though no improvement in tremors. Today she said she has not noticed any benefit and wants to discontinue it.

## 2022-08-03 ENCOUNTER — Other Ambulatory Visit: Payer: Self-pay | Admitting: Obstetrics & Gynecology

## 2022-08-03 NOTE — Telephone Encounter (Signed)
Med refill request: Prometrium (denied 05/24/22) Last AEX: 05/09/21 Next AEX: 01/01/23 Last MMG (if hormonal med) 11/07/19 Refill authorized: Please Advise?

## 2022-08-07 ENCOUNTER — Other Ambulatory Visit: Payer: Self-pay

## 2022-08-07 NOTE — Telephone Encounter (Signed)
Pt LVM in triage line stating that a couple years ago it was discussed that she come off of taking prometrium due to "it not being a good idea for her to continue."  However, she was provided refills for estrogen but now requesting that she be restarted on the prometrium due to experiencing terrible hot flashes.   In 10/2019-OV notes reported pt being on HRT for ~>10years, it was advised to start weaning. Pt has continued taking estradiol 0.5mg (1/2 tab @ 0.25mg  daily) along with progesterone 100mg  caps qhs.   Spoke w/ pt and she reports was provided w/ estrogen refills in 03/2022 and has been taking w/o progesterone since refills have been denied since ~06/2022. Pt reports only sxs she has been experiencing is increased hot flashes.  Pt informed of policy that maintenance/hormone medications are not usually refilled unless pt is UTD on AEXs/mammograms due to risks. Pt voiced understanding and stated that she hadnt realized that it had been that long since her screenings and that she is planning on receiving/starting her medicare in 12/2022 and is trying to wait for her appointments until then. Pt advised that she may not be provided with refills and/or also be advised to discontinue taking estrogen until she can be seen. Pt voiced understanding. Please advise.  Last AEX 03/10/2021--was scheduled for AEX on 05/19/22 but cancelled due to "pt stated triage informed her per ML to get mammo done b4 her aex," then was scheduled for AEX on 06/08/2022 which was cancelled by pt, now scheduled for B&P on 01/01/2023 w/ BS  Last mammo 11/07/2019 neg birads 1, was scheduled for 05/22/22 (cancelled by pt and r/s of 3/7), 3/7 appt cancelled by pt, now not scheduled until 12/22/2022

## 2022-08-11 MED ORDER — PROGESTERONE MICRONIZED 100 MG PO CAPS: 100.0000 mg | ORAL_CAPSULE | Freq: Every day | ORAL | 0 refills | Status: DC

## 2022-08-11 NOTE — Telephone Encounter (Signed)
Spoke with patient. Patient calling for update on refill of progesterone.   Reviewed previous conversation as sen below. We discussed importance of taking estrogen with  progesterone when you still have a uterus.   Reviewed barriers to scheduling breast imaging. Patient wants to wait until new insurance plan effective in 12/2022. Advised ultimately her decision, MMG is overdue and HRT likely will not be refilled until MMG is updated given last MMG 2021. Reviewed importance of MMG and why this is needed when on HRT. Questions answered. Patient is going to consider this and reach out to [facility] to see if she can move MMG to earlier date. Advised the request was already forwarded to Dr. Seymour Bars -our office will notify once reviewed. Patient appreciative of call.   Routing to Dr. Seymour Bars

## 2022-09-10 ENCOUNTER — Other Ambulatory Visit: Payer: Self-pay | Admitting: Psychiatry

## 2022-09-10 DIAGNOSIS — F3163 Bipolar disorder, current episode mixed, severe, without psychotic features: Secondary | ICD-10-CM

## 2022-09-14 ENCOUNTER — Ambulatory Visit
Admission: RE | Admit: 2022-09-14 | Discharge: 2022-09-14 | Disposition: A | Discharge status: Home / Self Care | Payer: 59 | Source: Ambulatory Visit | Attending: Obstetrics & Gynecology | Admitting: Obstetrics & Gynecology

## 2022-09-14 DIAGNOSIS — Z1231 Encounter for screening mammogram for malignant neoplasm of breast: Secondary | ICD-10-CM | POA: Diagnosis not present

## 2022-09-18 ENCOUNTER — Ambulatory Visit: Payer: 59 | Admitting: Psychiatry

## 2022-09-18 ENCOUNTER — Encounter: Payer: Self-pay | Admitting: Psychiatry

## 2022-09-18 VITALS — BP 130/60 | HR 59

## 2022-09-18 DIAGNOSIS — F411 Generalized anxiety disorder: Secondary | ICD-10-CM

## 2022-09-18 DIAGNOSIS — R251 Tremor, unspecified: Secondary | ICD-10-CM

## 2022-09-18 DIAGNOSIS — F32A Depression, unspecified: Secondary | ICD-10-CM

## 2022-09-18 DIAGNOSIS — F3170 Bipolar disorder, currently in remission, most recent episode unspecified: Secondary | ICD-10-CM | POA: Diagnosis not present

## 2022-09-18 DIAGNOSIS — F3163 Bipolar disorder, current episode mixed, severe, without psychotic features: Secondary | ICD-10-CM

## 2022-09-18 MED ORDER — LAMOTRIGINE 200 MG PO TABS: 200.0000 mg | ORAL_TABLET | Freq: Every day | ORAL | 1 refills | Status: DC

## 2022-09-18 MED ORDER — LITHIUM CARBONATE 300 MG PO CAPS
900.0000 mg | ORAL_CAPSULE | Freq: Every day | ORAL | 1 refills | Status: DC
Start: 2022-09-18 — End: 2022-12-10

## 2022-09-18 MED ORDER — DULOXETINE HCL 20 MG PO CPEP
40.0000 mg | ORAL_CAPSULE | Freq: Every day | ORAL | 1 refills | Status: DC
Start: 2022-09-18 — End: 2023-01-15

## 2022-09-18 MED ORDER — BUSPIRONE HCL 15 MG PO TABS
15.0000 mg | ORAL_TABLET | Freq: Two times a day (BID) | ORAL | 1 refills | Status: DC
Start: 2022-09-18 — End: 2022-12-12

## 2022-09-18 MED ORDER — PROPRANOLOL HCL 10 MG PO TABS
ORAL_TABLET | ORAL | 2 refills | Status: DC
Start: 2022-09-18 — End: 2022-12-31

## 2022-09-18 MED ORDER — GABAPENTIN 300 MG PO CAPS
300.0000 mg | ORAL_CAPSULE | Freq: Every day | ORAL | 1 refills | Status: DC
Start: 2022-09-18 — End: 2022-12-31

## 2022-09-18 NOTE — Progress Notes (Unsigned)
Kara Webster [id number] [birth year] 67 y.o.  Subjective:   Patient ID:  Kara Webster is a 67 y.o. (DOB [birth year]) female.  Chief Complaint: No chief complaint on file.   HPI Kara Webster presents to the office today for follow-up of bipolar disorder, anxiety, and history o  "I'm ok."  She reports that she has shaking of her hands and it did not improve with decreasing and discontinuing Risperdal- "in fact, it's gotten worse over time." She reports that she has a "habit of wiggling my foot." She denies any worsening mood symptoms with stopping Risperdal.   She reports that she has had a few periods of depression. She reports that depressive episodes can last 1-2 months. She reports, "work seems to make my depression worse, because I am not able to do things that I am supposed to do." She reports that she sometimes ruminates on regrets from the past and this will worsen her mood. She reports some anxiety with keeping up at work. She denies any other rumination or worry. Denies any recent manic symptoms. She denies impulsive or risky behavior. Denies excessive spending.   She reports adequate sleep and estimates sleeping at least 8 hours a night. She reports that her appetite is "better than it should be." She reports that her energy and motivation are ok overall and lower with periods of depression. She reports poor concentration and focus "for anything." Denies SI.   She has had some difficulty with memory. She reports that her mother had a "terrible" memory and her sister has also said she has had memory difficulties.   Denies any recent alcohol use.   She is retiring in October.   She reports that her children and granddaughter are doing well. Darl Pikes is doing well.  She reports that her sister showed up unexpectedly at her door earlier today.   Past Psychiatric Medication Trials: Abilify- Caused tremors. Has not noticed any worsening s/s since stopping it. Risperidone Cymbalta-  Thinks this has been helpful for her depression. Has had severe discontinuation s/s with missed doses to the point of going to ER. Lamictal- Has been helpful for mood. Reports taking long term. Gabapentin- Takes for RLS with good response. Wellbutrin- Had convulsion when taking while drinking heavily.  Buspar   AIMS    Flowsheet Row Video Visit from 05/18/2022 in [facility] Office Visit from 11/17/2021 in [facility] Office Visit from 05/13/2021 in [facility]  AIMS Total Score 10 1 1         Review of Systems:  Review of Systems  Musculoskeletal:  Negative for gait problem.  Neurological:  Positive for tremors.  Psychiatric/Behavioral:         Please refer to HPI    Medications: I have reviewed the patient's current medications.  Current Outpatient Medications  Medication Sig Dispense Refill   busPIRone (BUSPAR) 15 MG tablet Take 1 tablet (15 mg total) by mouth 2 (two) times daily. 180 tablet 1   cholecalciferol (VITAMIN D3) 25 MCG (1000 UNIT) tablet Take 1,000 Units by mouth daily.     DULoxetine (CYMBALTA) 20 MG capsule Take 2 capsules (40 mg total) by mouth daily. 180 capsule 1   estradiol (ESTRACE) 0.5 MG tablet Take 0.5 tablets (0.25 mg total) by mouth daily. 45 tablet 2   gabapentin (NEURONTIN) 300 MG capsule Take 1 capsule (300 mg total) by mouth at bedtime. 90 capsule 1   lamoTRIgine (LAMICTAL) 200 MG  tablet Take 1 tablet (200 mg total) by mouth daily. 90 tablet 1   levothyroxine (SYNTHROID) 50 MCG tablet Take 1 tablet (50 mcg total) by mouth daily. 60 tablet 2   lithium carbonate 300 MG capsule TAKE THREE CAPSULES BY MOUTH AT BEDTIME 90 capsule 0   Multiple Vitamin (MULTIVITAMIN) tablet Take 1 tablet by mouth daily.     progesterone (PROMETRIUM) 100 MG capsule Take 1 capsule (100 mg total) by mouth at bedtime. 90 capsule 0   risperiDONE (RISPERDAL) 0.25 MG tablet Take 1 tablet (0.25 mg  total) by mouth at bedtime. 90 tablet 1   No current facility-administered medications for this visit.    Medication Side Effects: Other: Tremor  Allergies:  Allergies  Allergen Reactions   Aripiprazole     Other reaction(s): dyskinesia   Codeine Nausea And Vomiting   Cetirizine Rash    Past Medical History:  Diagnosis Date   Depression    Hypothyroid    RLS (restless legs syndrome)    Squamous cell carcinoma of skin 10/18/2020   Mid Tip of Nose (in situ)    Past Medical History, Surgical history, Social history, and Family history were reviewed and updated as appropriate.   Please see review of systems for further details on the patient's review from today.   Objective:   Physical Exam:  There were no vitals taken for this visit.  Physical Exam Constitutional:      General: She is not in acute distress. Musculoskeletal:        General: No deformity.  Neurological:     Mental Status: She is alert and oriented to person, place, and time.     Coordination: Coordination normal.  Psychiatric:        Attention and Perception: Attention and perception normal. She does not perceive auditory or visual hallucinations.        Mood and Affect: Mood normal. Mood is not anxious or depressed. Affect is not labile, blunt, angry or inappropriate.        Speech: Speech normal.        Behavior: Behavior normal.        Thought Content: Thought content normal. Thought content is not paranoid or delusional. Thought content does not include homicidal or suicidal ideation. Thought content does not include homicidal or suicidal plan.        Cognition and Memory: Cognition and memory normal.        Judgment: Judgment normal.     Comments: Insight intact     Lab Review:     Component Value Date/Time   NA 145 05/09/2021 0845   K 4.9 05/09/2021 0845   CL 104 05/09/2021 0845   CO2 35 (H) 05/09/2021 0845   GLUCOSE 88 05/09/2021 0845   BUN 11 05/09/2021 0845   CREATININE 0.93  05/29/2022 1157   CREATININE 0.85 05/09/2021 0845   CALCIUM 10.3 05/09/2021 0845   PROT 6.9 05/09/2021 0845   ALBUMIN 4.2 10/27/2018 0256   AST 16 05/09/2021 0845   ALT 7 05/09/2021 0845   ALKPHOS 55 10/27/2018 0256   BILITOT 0.5 05/09/2021 0845   GFRNONAA >60 10/27/2018 0256   GFRAA >60 10/27/2018 0256       Component Value Date/Time   WBC 6.0 05/09/2021 0845   RBC 4.69 05/09/2021 0845   HGB 13.8 05/09/2021 0845   HCT 42.4 05/09/2021 0845   PLT 354 05/09/2021 0845   MCV 90.4 05/09/2021 0845   MCV 88.8 05/27/2013 1250   MCH  29.4 05/09/2021 0845   MCHC 32.5 05/09/2021 0845   RDW 11.8 05/09/2021 0845   LYMPHSABS 1.5 10/27/2018 0256   MONOABS 0.5 10/27/2018 0256   EOSABS 0.1 10/27/2018 0256   BASOSABS 0.0 10/27/2018 0256    Lithium Lvl  Date Value Ref Range Status  05/29/2022 0.9 0.5 - 1.2 mmol/L Final    Comment:    A concentration of 0.5-0.8 mmol/L is advised for long-term use; concentrations of up to 1.2 mmol/L may be necessary during acute treatment.                                  Detection Limit = 0.1                           <0.1 indicates None Detected      No results found for: "PHENYTOIN", "PHENOBARB", "VALPROATE", "CBMZ"   .res Assessment: Plan:   "I am in a good place."   There are no diagnoses linked to this encounter.   Please see After Visit Summary for patient specific instructions.  Future Appointments  Date Time Provider Department Center  01/01/2023 11:00 AM Ardell Isaacs, Forrestine Him, MD [facility] None    No orders of the defined types were placed in this encounter.   -------------------------------

## 2022-09-19 NOTE — Progress Notes (Signed)
Kara Webster [id number] 09-24-1955 67 y.o.  Subjective:   Patient ID:  Kara Webster is a 67 y.o. (DOB 1955-11-27) female.  Chief Complaint:  Chief Complaint  Patient presents with   Other    Tremor   Follow-up    Mood disturbance, anxiety    HPI Kara Webster presents to the office today for follow-up of bipolar disorder and anxiety. She reports, "I'm ok." She reports that she is concerned about her hands shaking. She reports that tremor did not improve with decreasing and discontinuing Risperdal- "in fact, it's gotten worse over time." She reports that involuntary orofacial movements resolved with decreasing and discontinuing Risperdal. She reports that she has a "habit of wiggling my foot" and thinks that this may be long-standing. She denies any worsening mood symptoms with stopping Risperdal.   She reports that she has had a few periods of depression. She reports that depressive episodes can last 1-2 months. She reports, "work seems to make my depression worse, because I am not able to do things that I am supposed to do." She reports that she sometimes ruminates on regrets from the past and this will worsen her mood. She reports some anxiety with keeping up at work. She denies any other rumination or worry. Denies any recent manic symptoms. She denies impulsive or risky behavior. Denies excessive spending.   She reports adequate sleep and estimates sleeping at least 8 hours a night. She reports that her appetite is "better than it should be." She reports that her energy and motivation are ok overall and lower with periods of depression. She reports poor concentration and focus "for anything." Denies SI.   She has had some difficulty with memory. She reports that her mother had a "terrible" memory and her sister has also said she has had memory difficulties.   Denies any recent alcohol use.   She is retiring in October.   She reports that her children and granddaughter are doing  well. Darl Pikes is doing well.  She reports that her sister showed up unexpectedly at her door earlier today.   Past Psychiatric Medication Trials: Abilify- Caused tremors. Has not noticed any worsening s/s since stopping it. Risperidone Cymbalta- Thinks this has been helpful for her depression. Has had severe discontinuation s/s with missed doses to the point of going to ER. Lamictal- Has been helpful for mood. Reports taking long term. Gabapentin- Takes for RLS with good response. Wellbutrin- Had convulsion when taking while drinking heavily.  Buspar   AIMS    Flowsheet Row Office Visit from 09/18/2022 in [facility] Video Visit from 05/18/2022 in [facility] Office Visit from 11/17/2021 in [facility] Office Visit from 05/13/2021 in [facility]  AIMS Total Score 10 10 1 1         Review of Systems:  Review of Systems  Musculoskeletal:  Negative for gait problem.  Neurological:  Positive for tremors.  Psychiatric/Behavioral:         Please refer to HPI    Medications: I have reviewed the patient's current medications.  Current Outpatient Medications  Medication Sig Dispense Refill   CALCIUM PO Take by mouth.     cholecalciferol (VITAMIN D3) 25 MCG (1000 UNIT) tablet Take 1,000 Units by mouth daily.     estradiol (ESTRACE) 0.5 MG tablet Take 0.5 tablets (0.25 mg total) by mouth daily. 45 tablet 2   levothyroxine (SYNTHROID) 50 MCG tablet Take  1 tablet (50 mcg total) by mouth daily. 60 tablet 2   Multiple Vitamin (MULTIVITAMIN) tablet Take 1 tablet by mouth daily.     progesterone (PROMETRIUM) 100 MG capsule Take 1 capsule (100 mg total) by mouth at bedtime. 90 capsule 0   propranolol (INDERAL) 10 MG tablet Take 1 tab po BID prn anxiety 60 tablet 2   busPIRone (BUSPAR) 15 MG tablet Take 1 tablet (15 mg total) by mouth 2 (two) times daily. 180 tablet 1   DULoxetine  (CYMBALTA) 20 MG capsule Take 2 capsules (40 mg total) by mouth daily. 180 capsule 1   gabapentin (NEURONTIN) 300 MG capsule Take 1 capsule (300 mg total) by mouth at bedtime. 90 capsule 1   lamoTRIgine (LAMICTAL) 200 MG tablet Take 1 tablet (200 mg total) by mouth daily. 90 tablet 1   lithium carbonate 300 MG capsule Take 3 capsules (900 mg total) by mouth at bedtime. 90 capsule 1   No current facility-administered medications for this visit.    Medication Side Effects: Other: Tremor  Allergies:  Allergies  Allergen Reactions   Aripiprazole     Other reaction(s): dyskinesia   Codeine Nausea And Vomiting   Cetirizine Rash    Past Medical History:  Diagnosis Date   Depression    Hypothyroid    RLS (restless legs syndrome)    Squamous cell carcinoma of skin 10/18/2020   Mid Tip of Nose (in situ)    Past Medical History, Surgical history, Social history, and Family history were reviewed and updated as appropriate.   Please see review of systems for further details on the patient's review from today.   Objective:   Physical Exam:  BP 130/60   Pulse (!) 59   Physical Exam Constitutional:      General: She is not in acute distress. Musculoskeletal:        General: No deformity.  Neurological:     Mental Status: She is alert and oriented to person, place, and time.     Coordination: Coordination normal.  Psychiatric:        Attention and Perception: Attention and perception normal. She does not perceive auditory or visual hallucinations.        Mood and Affect: Mood normal. Mood is not anxious or depressed. Affect is not labile, blunt, angry or inappropriate.        Speech: Speech normal.        Behavior: Behavior normal.        Thought Content: Thought content normal. Thought content is not paranoid or delusional. Thought content does not include homicidal or suicidal ideation. Thought content does not include homicidal or suicidal plan.        Cognition and Memory:  Cognition and memory normal.        Judgment: Judgment normal.     Comments: Insight intact     Lab Review:     Component Value Date/Time   NA 145 05/09/2021 0845   K 4.9 05/09/2021 0845   CL 104 05/09/2021 0845   CO2 35 (H) 05/09/2021 0845   GLUCOSE 88 05/09/2021 0845   BUN 11 05/09/2021 0845   CREATININE 0.93 05/29/2022 1157   CREATININE 0.85 05/09/2021 0845   CALCIUM 10.3 05/09/2021 0845   PROT 6.9 05/09/2021 0845   ALBUMIN 4.2 10/27/2018 0256   AST 16 05/09/2021 0845   ALT 7 05/09/2021 0845   ALKPHOS 55 10/27/2018 0256   BILITOT 0.5 05/09/2021 0845   GFRNONAA >60 10/27/2018 0256  GFRAA >60 10/27/2018 0256       Component Value Date/Time   WBC 6.0 05/09/2021 0845   RBC 4.69 05/09/2021 0845   HGB 13.8 05/09/2021 0845   HCT 42.4 05/09/2021 0845   PLT 354 05/09/2021 0845   MCV 90.4 05/09/2021 0845   MCV 88.8 05/27/2013 1250   MCH 29.4 05/09/2021 0845   MCHC 32.5 05/09/2021 0845   RDW 11.8 05/09/2021 0845   LYMPHSABS 1.5 10/27/2018 0256   MONOABS 0.5 10/27/2018 0256   EOSABS 0.1 10/27/2018 0256   BASOSABS 0.0 10/27/2018 0256    Lithium Lvl  Date Value Ref Range Status  05/29/2022 0.9 0.5 - 1.2 mmol/L Final    Comment:    A concentration of 0.5-0.8 mmol/L is advised for long-term use; concentrations of up to 1.2 mmol/L may be necessary during acute treatment.                                  Detection Limit = 0.1                           <0.1 indicates None Detected      No results found for: "PHENYTOIN", "PHENOBARB", "VALPROATE", "CBMZ"   .res Assessment: Plan:   Discussed her concern about worsening tremor. Discussed that tremor may be a side effect of Lithium and that tremor may improve with dose reduction. Pt reports that she prefers to continue current dose of Lithium since benefits are outweighing risks at this time, especially with planned retirement in several months- "I am in a good place." Also discussed Tardive Dyskinesia and occasional  paradoxical worsening in involuntary movements after stopping atypical antipsychotics, and that this may also be a reason for recent worsening tremor. Discussed that sometimes acute worsening in TD after stopping antipsychotics will then improve over time.  Discussed starting trial of Propranolol prn for tremor and anxiety. Discussed onset and duration of Propranolol prn. 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  Pt reports that she would like to continue current medications without changes at this time. She denies any worsening symptoms since stopping Risperdal and does not see any need to re-start Risperdal or an alternative medication.  Will continue Lithium 900 mg at bedtime for mood stabilization.  Continue Cymablta 40 mg daily for anxiety and depression.  Continue Gabapentin 300 mg po at bedtime for anxiety and insomnia.  Continue Lamictal 200 mg daily for mood stabilization.  Continue Buspar 15 mg po BID for anxiety.  Pt to follow-up in 4 months or sooner if clinically indicated.  Patient advised to contact office with any questions, adverse effects, or acute worsening in signs and symptoms.    Gilana was seen today for other and follow-up.  Diagnoses and all orders for this visit:  Bipolar disorder in partial remission, most recent episode unspecified type (HCC) -     DULoxetine (CYMBALTA) 20 MG capsule; Take 2 capsules (40 mg total) by mouth daily. -     lamoTRIgine (LAMICTAL) 200 MG tablet; Take 1 tablet (200 mg total) by mouth daily. -     lithium carbonate 300 MG capsule; Take 3 capsules (900 mg total) by mouth at bedtime.  Generalized anxiety disorder -     busPIRone (BUSPAR) 15 MG tablet; Take 1 tablet (15 mg total) by mouth  2 (two) times daily. -     DULoxetine (CYMBALTA) 20 MG capsule; Take 2 capsules (40 mg total) by mouth daily. -     gabapentin (NEURONTIN) 300 MG capsule;  Take 1 capsule (300 mg total) by mouth at bedtime. -     lamoTRIgine (LAMICTAL) 200 MG tablet; Take 1 tablet (200 mg total) by mouth daily.  Tremor -     propranolol (INDERAL) 10 MG tablet; Take 1 tab po BID prn anxiety     Please see After Visit Summary for patient specific instructions.  Future Appointments  Date Time Provider Department Center  01/01/2023 11:00 AM Patton Salles, MD [facility] None  01/15/2023  9:00 AM Corie Chiquito, PMHNP [facility] None    No orders of the defined types were placed in this encounter.   -------------------------------

## 2022-10-13 ENCOUNTER — Telehealth: Payer: Self-pay | Admitting: Psychiatry

## 2022-10-13 NOTE — Telephone Encounter (Signed)
PT called and said that the propranolol 10 mg is not working at all. Please call her and let her know what to do. Her phone number is [phone]

## 2022-10-13 NOTE — Telephone Encounter (Signed)
Patient is reporting no improvement with the propranolol BID. She said she has researched TD and wonders if her thyroid medication could be contributing. She said it is really bad, is all day.

## 2022-10-16 NOTE — Telephone Encounter (Signed)
Patient called back and left the following message.

## 2022-10-16 NOTE — Telephone Encounter (Signed)
Shanda Bumps, her main concern is the tremor.  Jola Babinski will you please refer her to Neuro.

## 2022-10-16 NOTE — Telephone Encounter (Signed)
Patient notified of recommendation.

## 2022-10-16 NOTE — Telephone Encounter (Signed)
Please advise her to decrease Lithium from three 300 mg capsules to two 300 mg capsules to start. Please let her know that tremor is usually a dose related side effect and if her tremor is associated with Lithium, it should improve with decrease in dose. She may be able to stay on a lower dose of Lithium and still get some mood benefit without severe tremor.

## 2022-10-16 NOTE — Telephone Encounter (Signed)
Is her main concern anxiety or tremor? If it is a tremor, would recommend referral to neurology. Would not recommend increase in Propranolol since she has a low heart rate at baseline. Tremor is a common side effect of Lithium and may improve if dose was decreased, however Lithium seems to be very effective for her mood and she said at last visit she did not want to reduce dose.

## 2022-10-16 NOTE — Telephone Encounter (Signed)
Patient called back regarding previous message. States that she instead would like to try to stop taking the lithium and hold off on seeing the neurologist. Ph: [phone] Appt 10/28

## 2022-11-09 ENCOUNTER — Other Ambulatory Visit: Payer: Self-pay | Admitting: Obstetrics & Gynecology

## 2022-11-09 NOTE — Telephone Encounter (Signed)
Medication refill request: prometrium 100mg  Last AEX:  03-10-21 Next AEX: 01-01-23 Last MMG (if hormonal medication request): 09-14-22 birads 1:neg Refill authorized: please approve if appropriate

## 2022-12-09 ENCOUNTER — Other Ambulatory Visit: Payer: Self-pay | Admitting: Psychiatry

## 2022-12-09 DIAGNOSIS — F3170 Bipolar disorder, currently in remission, most recent episode unspecified: Secondary | ICD-10-CM

## 2022-12-12 ENCOUNTER — Other Ambulatory Visit: Payer: Self-pay | Admitting: Psychiatry

## 2022-12-12 DIAGNOSIS — F411 Generalized anxiety disorder: Secondary | ICD-10-CM

## 2022-12-18 NOTE — Progress Notes (Signed)
67 y.o. G2P2 Divorced Caucasian female here for annual exam.    She is followed for HRT.  Takes Estradiol 0.5 mg, 1/2 tab daily and Prometrium 100 mg q hs.   Denies vaginal bleeding.   Her PCP gives her Synthroid.   She is describing difficulty having BMs.  Feels like stool gets caught and will not pass.  Some urgency to void and can leak urine. No leak with cough, laugh.   States she had bladder surgery twice for bladder prolapse.  Had abdominal retropubic surgery x 2.   Hx genital herpes.   Divorced.  Female partner.  2 daughters, 2 grandchildren.   PCP:   None  No LMP recorded. Patient is postmenopausal.           Sexually active: No.  The current method of family planning is post menopausal status.    Exercising: Yes.     walking Smoker:  former  Health Maintenance: Pap:  03/10/21 neg, 06/01/17 neg History of abnormal Pap:  no MMG:  09/14/22 Breast Density Cat B, BI-RADS CAT 1 neg Colonoscopy:  n/a, wants to schedule BMD:   11/11/19  Result  osteopenia TDaP:  PCP Gardasil:   no HIV: n/a Hep C: n/a Screening Labs:  PCP   reports that she quit smoking about 26 years ago. Her smoking use included cigarettes. She has never used smokeless tobacco. She reports that she does not currently use alcohol. She reports that she does not currently use drugs.  Past Medical History:  Diagnosis Date   Depression    Hypothyroid    RLS (restless legs syndrome)    Squamous cell carcinoma of skin 10/18/2020   Mid Tip of Nose (in situ)    Past Surgical History:  Procedure Laterality Date   BLADDER SURGERY     TUBAL LIGATION      Current Outpatient Medications  Medication Sig Dispense Refill   busPIRone (BUSPAR) 15 MG tablet TAKE 1 TABLET BY MOUTH TWICE A DAY 60 tablet 0   CALCIUM PO Take by mouth.     cholecalciferol (VITAMIN D3) 25 MCG (1000 UNIT) tablet Take 1,000 Units by mouth daily.     estradiol (ESTRACE) 0.5 MG tablet TAKE 1/2 TABLET BY MOUTH DAILY 15 tablet 0    gabapentin (NEURONTIN) 300 MG capsule Take 1 capsule (300 mg total) by mouth at bedtime. 30 capsule 0   lamoTRIgine (LAMICTAL) 200 MG tablet Take 1 tablet (200 mg total) by mouth daily. 30 tablet 0   levothyroxine (SYNTHROID) 50 MCG tablet Take 1 tablet (50 mcg total) by mouth daily. 60 tablet 2   lithium carbonate 300 MG capsule TAKE THREE CAPSULES (900MG  TOTAL) BY MOUTH EVERY NIGHT AT BEDTIME 90 capsule 0   Multiple Vitamin (MULTIVITAMIN) tablet Take 1 tablet by mouth daily.     progesterone (PROMETRIUM) 100 MG capsule TAKE 1 CAPSULE BY MOUTH AT BEDTIME 90 capsule 0   propranolol (INDERAL) 10 MG tablet Take 1 tab po BID prn anxiety 60 tablet 0   DULoxetine (CYMBALTA) 20 MG capsule Take 2 capsules (40 mg total) by mouth daily. 180 capsule 1   No current facility-administered medications for this visit.    Family History  Problem Relation Age of Onset   Cancer Mother        leukemia    Hypertension Mother    Cancer Father        lung    Alcohol abuse Father    Diabetes Sister    Hyperlipidemia  Sister    Hypertension Sister    Depression Brother    Diabetes Brother    Hypertension Brother    Alcohol abuse Maternal Uncle    Alcohol abuse Paternal Aunt    Alcohol abuse Maternal Grandfather    Alcohol abuse Brother     Review of Systems  All other systems reviewed and are negative.   Exam:   BP 126/74 (BP Location: Right Arm, Patient Position: Sitting, Cuff Size: Normal)   Ht 5\' 4"  (1.626 m)   Wt 141 lb (64 kg)   BMI 24.20 kg/m     General appearance: alert, cooperative and appears stated age Head: normocephalic, without obvious abnormality, atraumatic Neck: no adenopathy, supple, symmetrical, trachea midline and thyroid normal to inspection and palpation Lungs: clear to auscultation bilaterally Breasts: normal appearance, no masses or tenderness, No nipple retraction or dimpling, No nipple discharge or bleeding, No axillary adenopathy Heart: regular rate and  rhythm Abdomen: soft, non-tender; no masses, no organomegaly Extremities: extremities normal, atraumatic, no cyanosis or edema Skin: skin color, texture, turgor normal. No rashes or lesions Lymph nodes: cervical, supraclavicular, and axillary nodes normal. Neurologic: grossly normal  Pelvic: External genitalia:  no lesions              No abnormal inguinal nodes palpated.              Urethra:  normal appearing urethra with no masses, tenderness or lesions              Bartholins and Skenes: normal                 Vagina: normal appearing vagina with normal color and discharge, no lesions, second degree rectocele.               Cervix: no lesions              Pap taken: no Bimanual Exam:  Uterus:  normal size, contour, position, consistency, mobility, non-tender              Adnexa: right adnexa with fullness and no tenderness.  Left adnexa with no mass, fullness, tenderness              Rectal exam: yes.  Confirms.              Anus:  normal sphincter tone, no lesions  Chaperone was present for exam:  Warren Lacy, CMA  Assessment:   Encounter for breast and pelvic exam.  GYN exam for high risk Medicare condition.  HRT.  Osteopenia.  Second degree rectocele.  Urinary urgency.  Right adnexal fullness. Hx HSV.  Plan: Mammogram screening discussed. Self breast awareness reviewed. Pap and HR HPV 2025. Guidelines for Calcium, Vitamin D, regular exercise program including cardiovascular and weight bearing exercise. Discused WHI and use of HRT which can increase risk of PE, DVT, MI, stroke and breast cancer.  She has decided to stop her HRT[facility]  We reviewed pessary, pelvic floor therapy, and possible surgical correction to rectocele.  She accepts referral to urogynecology. Will order pelvic ultrasound. Follow up annually and prn.   40 min  total time was spent for this patient encounter, including preparation, face-to-face counseling with the patient,  coordination of care, and documentation of the encounter in addition to doing the breast and pelvic exam.

## 2022-12-22 ENCOUNTER — Ambulatory Visit: Payer: Self-pay

## 2022-12-27 ENCOUNTER — Other Ambulatory Visit: Payer: Self-pay | Admitting: Obstetrics & Gynecology

## 2022-12-28 NOTE — Telephone Encounter (Signed)
Med refill request: estrace Last OV: 06/20/21 Next AEX: 01/01/23 Last MMG (if hormonal med) 09/14/22 Refill authorized: Please Advise, #[id number], 2 RF

## 2022-12-29 ENCOUNTER — Telehealth: Payer: Self-pay | Admitting: Psychiatry

## 2022-12-29 DIAGNOSIS — R251 Tremor, unspecified: Secondary | ICD-10-CM

## 2022-12-29 DIAGNOSIS — F411 Generalized anxiety disorder: Secondary | ICD-10-CM

## 2022-12-29 DIAGNOSIS — F3170 Bipolar disorder, currently in remission, most recent episode unspecified: Secondary | ICD-10-CM

## 2022-12-29 NOTE — Telephone Encounter (Signed)
Patient called in for refill on Gabapentin 300mg , Lamotrigine 200mg  and Propranolol 10mg . Ph: [phone] Appt 10/28 [facility] [address] [place], [place]

## 2022-12-31 MED ORDER — PROPRANOLOL HCL 10 MG PO TABS: ORAL_TABLET | ORAL | 0 refills | Status: DC

## 2022-12-31 MED ORDER — GABAPENTIN 300 MG PO CAPS
300.0000 mg | ORAL_CAPSULE | Freq: Every day | ORAL | 0 refills | Status: DC
Start: 2022-12-31 — End: 2023-01-15

## 2022-12-31 MED ORDER — LAMOTRIGINE 200 MG PO TABS: 200.0000 mg | ORAL_TABLET | Freq: Every day | ORAL | 0 refills | Status: DC

## 2022-12-31 NOTE — Telephone Encounter (Signed)
Sent!

## 2023-01-01 ENCOUNTER — Ambulatory Visit (INDEPENDENT_AMBULATORY_CARE_PROVIDER_SITE_OTHER): Payer: Medicare HMO | Admitting: Obstetrics and Gynecology

## 2023-01-01 ENCOUNTER — Telehealth: Payer: Self-pay | Admitting: Obstetrics and Gynecology

## 2023-01-01 ENCOUNTER — Encounter: Payer: Self-pay | Admitting: Obstetrics and Gynecology

## 2023-01-01 VITALS — BP 126/74 | Ht 64.0 in | Wt 141.0 lb

## 2023-01-01 DIAGNOSIS — M8580 Other specified disorders of bone density and structure, unspecified site: Secondary | ICD-10-CM

## 2023-01-01 DIAGNOSIS — Z01419 Encounter for gynecological examination (general) (routine) without abnormal findings: Secondary | ICD-10-CM

## 2023-01-01 DIAGNOSIS — B009 Herpesviral infection, unspecified: Secondary | ICD-10-CM | POA: Diagnosis not present

## 2023-01-01 DIAGNOSIS — Z9189 Other specified personal risk factors, not elsewhere classified: Secondary | ICD-10-CM

## 2023-01-01 DIAGNOSIS — N816 Rectocele: Secondary | ICD-10-CM | POA: Diagnosis not present

## 2023-01-01 DIAGNOSIS — Z79890 Hormone replacement therapy (postmenopausal): Secondary | ICD-10-CM

## 2023-01-01 DIAGNOSIS — N949 Unspecified condition associated with female genital organs and menstrual cycle: Secondary | ICD-10-CM

## 2023-01-01 DIAGNOSIS — N951 Menopausal and female climacteric states: Secondary | ICD-10-CM

## 2023-01-01 NOTE — Telephone Encounter (Signed)
Please assist in making a referral to [facility] urogynecology for rectocele.   Patient also needs a pelvic ultrasound for a right adnexal fullness.

## 2023-01-01 NOTE — Patient Instructions (Addendum)
About Rectocele  Overview  A rectocele is a type of hernia which causes different degrees of bulging of the rectal tissues into the vaginal wall.  You may even notice that it presses against the vaginal wall so much that some vaginal tissues droop outside of the opening of your vagina.  Causes of Rectocele  The most common cause is childbirth.  The muscles and ligaments in the pelvis that hold up and support the female organs and vagina become stretched and weakened during labor and delivery.  The more babies you have, the more the support tissues are stretched and weakened.  Not everyone who has a baby will develop a rectocele.  Some women have stronger supporting tissue in the pelvis and may not have as much of a problem as others.  Women who have a Cesarean section usually do not get rectocele's unless they pushed a long time prior to the cesarean delivery.  Other conditions that can cause a rectocele include chronic constipation, a chronic cough, a lot of heavy lifting, and obesity.  Older women may have this problem because the loss of female hormones causes the vaginal tissue to become weaker.  Symptoms  There may not be any symptoms.  If you do have symptoms, they may include: Pelvic pressure in the rectal area Protrusion of the lower part of the vagina through the opening of the vagina Constipation and trapping of the stool, making it difficult to have a bowel movement.  In severe cases, you may have to press on the lower part of your vagina to help push the stool out of you rectum.  This is called splinting to empty.  Diagnosing Rectocele  Your health care provider will ask about your symptoms and perform a pelvic exam.  S/he will ask you to bear down, pushing like you are having a bowel movement so as to see how far the lower part of the vagina protrudes into the vagina and possible outside of the vagina.  Your provider will also ask you to contract the muscles of your pelvis (like you  are stopping the stream in the middle of urinating) to determine the strength of your pelvic muscles.  Your provider may also do a rectal exam.  Treatment Options  If you do not have any symptoms, no treatment may be necessary.  Other treatment options include: Pelvic floor exercises: Contracting the muscles in your genital area may help strengthen your muscles and support the organs.  Be sure to get proper exercise instruction from you physical therapist. A pessary (removealbe pelvic support device) sometimes helps rectocele symptoms. Surgery: Surgical repair may be necessary. In some cases the uterus may need to be taken out ( a hysterectomy) as well.  There are many types of surgery for pelvic support problems.  Look for physicians who specialize in repair procedures.  You can take care of yourself by: Treating and preventing constipation Avoiding heavy lifting, and lifting correctly (with your legs, not with you waist or back) Treating a chronic cough or bronchitis Not smoking avoiding too much weight gain Doing pelvic floor exercises   2007, [employer] Doc.33  EXERCISE AND DIET:  We recommended that you start or continue a regular exercise program for good health. Regular exercise means any activity that makes your heart beat faster and makes you sweat.  We recommend exercising at least 30 minutes per day at least 3 days a week, preferably 4 or 5.  We also recommend a diet low in fat  and sugar.  Inactivity, poor dietary choices and obesity can cause diabetes, heart attack, stroke, and kidney damage, among others.    ALCOHOL AND SMOKING:  Women should limit their alcohol intake to no more than 7 drinks/beers/glasses of wine (combined, not each!) per week. Moderation of alcohol intake to this level decreases your risk of breast cancer and liver damage. And of course, no recreational drugs are part of a healthy lifestyle.  And absolutely no smoking or even second hand smoke.  Most people know smoking can cause heart and lung diseases, but did you know it also contributes to weakening of your bones? Aging of your skin?  Yellowing of your teeth and nails?  CALCIUM AND VITAMIN D:  Adequate intake of calcium and Vitamin D are recommended.  The recommendations for exact amounts of these supplements seem to change often, but generally speaking 600 mg of calcium (either carbonate or citrate) and 800 units of Vitamin D per day seems prudent. Certain women may benefit from higher intake of Vitamin D.  If you are among these women, your doctor will have told you during your visit.    PAP SMEARS:  Pap smears, to check for cervical cancer or precancers,  have traditionally been done yearly, although recent scientific advances have shown that most women can have pap smears less often.  However, every woman still should have a physical exam from her gynecologist every year. It will include a breast check, inspection of the vulva and vagina to check for abnormal growths or skin changes, a visual exam of the cervix, and then an exam to evaluate the size and shape of the uterus and ovaries.  And after 66 years of age, a rectal exam is indicated to check for rectal cancers. We will also provide age appropriate advice regarding health maintenance, like when you should have certain vaccines, screening for sexually transmitted diseases, bone density testing, colonoscopy, mammograms, etc.   MAMMOGRAMS:  All women over 55 years old should have a yearly mammogram. Many facilities now offer a "3D" mammogram, which may cost around $50 extra out of pocket. If possible,  we recommend you accept the option to have the 3D mammogram performed.  It both reduces the number of women who will be called back for extra views which then turn out to be normal, and it is better than the routine mammogram at detecting truly abnormal areas.    COLONOSCOPY:  Colonoscopy to screen for colon cancer is recommended for all  women at age 38.  We know, you hate the idea of the prep.  We agree, BUT, having colon cancer and not knowing it is worse!!  Colon cancer so often starts as a polyp that can be seen and removed at colonscopy, which can quite literally save your life!  And if your first colonoscopy is normal and you have no family history of colon cancer, most women don't have to have it again for 10 years.  Once every ten years, you can do something that may end up saving your life, right?  We will be happy to help you get it scheduled when you are ready.  Be sure to check your insurance coverage so you understand how much it will cost.  It may be covered as a preventative service at no cost, but you should check your particular policy.

## 2023-01-02 NOTE — Telephone Encounter (Signed)
Patient returned call, message received. Patient will call to schedule PUS.

## 2023-01-02 NOTE — Telephone Encounter (Signed)
Referral and PUS order placed.   Call placed to patient. Left detailed message, ok per dpr. An order has been placed for a pelvic ultrasound by Dr. Edward Jolly. You will need to contact [facility] Main Radiology Scheduling at [phone] to schedule. If you have any additional questions or need any any additional assistance, contact the office at [phone], option 4.   Routing to [employer].

## 2023-01-04 ENCOUNTER — Ambulatory Visit
Admission: RE | Admit: 2023-01-04 | Discharge: 2023-01-04 | Disposition: A | Discharge status: Home / Self Care | Payer: Medicare HMO | Source: Ambulatory Visit | Attending: Obstetrics and Gynecology | Admitting: Obstetrics and Gynecology

## 2023-01-04 DIAGNOSIS — N949 Unspecified condition associated with female genital organs and menstrual cycle: Secondary | ICD-10-CM

## 2023-01-04 DIAGNOSIS — N858 Other specified noninflammatory disorders of uterus: Secondary | ICD-10-CM | POA: Diagnosis not present

## 2023-01-05 ENCOUNTER — Ambulatory Visit
Admission: RE | Admit: 2023-01-05 | Discharge: 2023-01-05 | Disposition: A | Discharge status: Home / Self Care | Payer: Medicare HMO | Source: Ambulatory Visit | Attending: Obstetrics and Gynecology | Admitting: Obstetrics and Gynecology

## 2023-01-05 DIAGNOSIS — M8580 Other specified disorders of bone density and structure, unspecified site: Secondary | ICD-10-CM

## 2023-01-05 DIAGNOSIS — E349 Endocrine disorder, unspecified: Secondary | ICD-10-CM | POA: Diagnosis not present

## 2023-01-05 DIAGNOSIS — M8588 Other specified disorders of bone density and structure, other site: Secondary | ICD-10-CM | POA: Diagnosis not present

## 2023-01-05 DIAGNOSIS — N958 Other specified menopausal and perimenopausal disorders: Secondary | ICD-10-CM | POA: Diagnosis not present

## 2023-01-05 DIAGNOSIS — N951 Menopausal and female climacteric states: Secondary | ICD-10-CM

## 2023-01-15 ENCOUNTER — Encounter: Payer: Self-pay | Admitting: Psychiatry

## 2023-01-15 ENCOUNTER — Ambulatory Visit: Payer: Medicare HMO | Admitting: Psychiatry

## 2023-01-15 DIAGNOSIS — F3170 Bipolar disorder, currently in remission, most recent episode unspecified: Secondary | ICD-10-CM | POA: Diagnosis not present

## 2023-01-15 DIAGNOSIS — F411 Generalized anxiety disorder: Secondary | ICD-10-CM | POA: Diagnosis not present

## 2023-01-15 MED ORDER — LITHIUM CARBONATE 300 MG PO CAPS
ORAL_CAPSULE | ORAL | Status: DC
Start: 2023-01-15 — End: 2023-02-19

## 2023-01-15 MED ORDER — BUSPIRONE HCL 15 MG PO TABS: 15.0000 mg | ORAL_TABLET | Freq: Two times a day (BID) | ORAL | 1 refills | Status: DC

## 2023-01-15 MED ORDER — LAMOTRIGINE 200 MG PO TABS: 200.0000 mg | ORAL_TABLET | Freq: Every day | ORAL | 1 refills | Status: DC

## 2023-01-15 MED ORDER — DULOXETINE HCL 20 MG PO CPEP
40.0000 mg | ORAL_CAPSULE | Freq: Every day | ORAL | 1 refills | Status: DC
Start: 2023-01-15 — End: 2023-05-06

## 2023-01-15 MED ORDER — GABAPENTIN 300 MG PO CAPS: 300.0000 mg | ORAL_CAPSULE | Freq: Every day | ORAL | 0 refills | Status: DC

## 2023-01-15 NOTE — Progress Notes (Unsigned)
[name] [id number] Jun 23, 1955 67 y.o.  Subjective:   Patient ID:  Kara Webster is a 67 y.o. (DOB 1955/09/10) female.  Chief Complaint: No chief complaint on file.   HPI Kara Webster presents to the office today for follow-up of Bipolar Disorder "I want to go off my meds, all my meds" due to tremor. She reports that she has  noticed involuntary mouth and lower extremity movements in the past.  She reports that her mood "fluctuates." She reports that she has had some depression. She reports that she tries to keep herself busy and occupied. She reports that she has some anger towards partner who has been busier and not had as much time to spend with her. Energy and motivation vary. Sleeping well. She reports anxiety "when I start to think about the past." Denies panic attacks. Appetite has been "very good." She reports that she has difficulty with concentration and time management- "my mind spins all the time." Denies impulsive or risky behavior. Denies any recent excessive spending. Denies SI.   She has retired and is enjoying retirement. She reports diminished socialization with retirement and continues to have regular interaction with family and [place]. She now has a dog and she has been taking dog on walks. She is thinking about joining a walking group or [employer].   Reports that she has not been able to see therapist due to financial constraints and waiting on social security benefits to start.   Taking Propranolol prn anxiety. Unsure if Propranolol helps with tremors.   Denies any recent alcohol use.   Gabapentin last filled 12/26/22.   Past Psychiatric Medication Trials: Abilify- Caused tremors. Has not noticed any worsening s/s since stopping it. Risperidone Cymbalta- Thinks this has been helpful for her depression. Has had severe discontinuation s/s with missed doses to the point of going to ER. Lamictal- Has been helpful for mood. Reports taking long  term. Gabapentin- Takes for RLS with good response. Wellbutrin- Had convulsion when taking while drinking heavily.  Buspar   AIMS    Flowsheet Row Office Visit from 09/18/2022 in [facility] Video Visit from 05/18/2022 in [facility] Office Visit from 11/17/2021 in [facility] Office Visit from 05/13/2021 in [facility]  AIMS Total Score 10 10 1 1         Review of Systems:  Review of Systems  Musculoskeletal:  Negative for gait problem.  Neurological:  Positive for tremors.  Psychiatric/Behavioral:         Please refer to HPI    Medications: I have reviewed the patient's current medications.  Current Outpatient Medications  Medication Sig Dispense Refill   busPIRone (BUSPAR) 15 MG tablet TAKE 1 TABLET BY MOUTH TWICE A DAY 60 tablet 0   CALCIUM PO Take by mouth.     cholecalciferol (VITAMIN D3) 25 MCG (1000 UNIT) tablet Take 1,000 Units by mouth daily.     DULoxetine (CYMBALTA) 20 MG capsule Take 2 capsules (40 mg total) by mouth daily. 180 capsule 1   estradiol (ESTRACE) 0.5 MG tablet TAKE 1/2 TABLET BY MOUTH DAILY 15 tablet 0   gabapentin (NEURONTIN) 300 MG capsule Take 1 capsule (300 mg total) by mouth at bedtime. 30 capsule 0   lamoTRIgine (LAMICTAL) 200 MG tablet Take 1 tablet (200 mg total) by mouth daily. 30 tablet 0   levothyroxine (SYNTHROID) 50 MCG tablet Take 1 tablet (50 mcg total) by mouth  daily. 60 tablet 2   lithium carbonate 300 MG capsule TAKE THREE CAPSULES (900MG  TOTAL) BY MOUTH EVERY NIGHT AT BEDTIME 90 capsule 0   Multiple Vitamin (MULTIVITAMIN) tablet Take 1 tablet by mouth daily.     progesterone (PROMETRIUM) 100 MG capsule TAKE 1 CAPSULE BY MOUTH AT BEDTIME 90 capsule 0   propranolol (INDERAL) 10 MG tablet Take 1 tab po BID prn anxiety 60 tablet 0   No current facility-administered medications for this visit.    Medication Side Effects: Other:  Tremor  Allergies:  Allergies  Allergen Reactions   Aripiprazole     Other reaction(s): dyskinesia   Codeine Nausea And Vomiting   Cetirizine Rash    Past Medical History:  Diagnosis Date   Depression    Hypothyroid    RLS (restless legs syndrome)    Squamous cell carcinoma of skin 10/18/2020   Mid Tip of Nose (in situ)    Past Medical History, Surgical history, Social history, and Family history were reviewed and updated as appropriate.   Please see review of systems for further details on the patient's review from today.   Objective:   Physical Exam:  There were no vitals taken for this visit.  Physical Exam  Lab Review:     Component Value Date/Time   NA 145 05/09/2021 0845   K 4.9 05/09/2021 0845   CL 104 05/09/2021 0845   CO2 35 (H) 05/09/2021 0845   GLUCOSE 88 05/09/2021 0845   BUN 11 05/09/2021 0845   CREATININE 0.93 05/29/2022 1157   CREATININE 0.85 05/09/2021 0845   CALCIUM 10.3 05/09/2021 0845   PROT 6.9 05/09/2021 0845   ALBUMIN 4.2 10/27/2018 0256   AST 16 05/09/2021 0845   ALT 7 05/09/2021 0845   ALKPHOS 55 10/27/2018 0256   BILITOT 0.5 05/09/2021 0845   GFRNONAA >60 10/27/2018 0256   GFRAA >60 10/27/2018 0256       Component Value Date/Time   WBC 6.0 05/09/2021 0845   RBC 4.69 05/09/2021 0845   HGB 13.8 05/09/2021 0845   HCT 42.4 05/09/2021 0845   PLT 354 05/09/2021 0845   MCV 90.4 05/09/2021 0845   MCV 88.8 05/27/2013 1250   MCH 29.4 05/09/2021 0845   MCHC 32.5 05/09/2021 0845   RDW 11.8 05/09/2021 0845   LYMPHSABS 1.5 10/27/2018 0256   MONOABS 0.5 10/27/2018 0256   EOSABS 0.1 10/27/2018 0256   BASOSABS 0.0 10/27/2018 0256    Lithium Lvl  Date Value Ref Range Status  05/29/2022 0.9 0.5 - 1.2 mmol/L Final    Comment:    A concentration of 0.5-0.8 mmol/L is advised for long-term use; concentrations of up to 1.2 mmol/L may be necessary during acute treatment.                                  Detection Limit = 0.1                            <0.1 indicates None Detected      No results found for: "PHENYTOIN", "PHENOBARB", "VALPROATE", "CBMZ"   .res Assessment: Plan:    There are no diagnoses linked to this encounter.   Please see After Visit Summary for patient specific instructions.  Future Appointments  Date Time Provider Department Center  01/15/2023  9:00 AM Corie Chiquito, PMHNP [facility] None    No orders of the  defined types were placed in this encounter.   -------------------------------

## 2023-01-26 ENCOUNTER — Other Ambulatory Visit: Payer: Self-pay

## 2023-01-26 NOTE — Telephone Encounter (Signed)
Med refill request: Estradiol Last AEX: 01/01/23 Next AEX: not scheduled Last MMG (if hormonal med) 09/14/22 Refill authorized: Please Advise, #15, 0 RF

## 2023-01-29 DIAGNOSIS — Z0000 Encounter for general adult medical examination without abnormal findings: Secondary | ICD-10-CM | POA: Diagnosis not present

## 2023-01-29 DIAGNOSIS — Z136 Encounter for screening for cardiovascular disorders: Secondary | ICD-10-CM | POA: Diagnosis not present

## 2023-01-29 DIAGNOSIS — G2581 Restless legs syndrome: Secondary | ICD-10-CM | POA: Diagnosis not present

## 2023-01-29 DIAGNOSIS — Z1211 Encounter for screening for malignant neoplasm of colon: Secondary | ICD-10-CM | POA: Diagnosis not present

## 2023-01-29 DIAGNOSIS — E039 Hypothyroidism, unspecified: Secondary | ICD-10-CM | POA: Diagnosis not present

## 2023-01-29 DIAGNOSIS — F319 Bipolar disorder, unspecified: Secondary | ICD-10-CM | POA: Diagnosis not present

## 2023-01-31 ENCOUNTER — Encounter: Payer: Self-pay | Admitting: Psychiatry

## 2023-01-31 ENCOUNTER — Telehealth: Payer: Self-pay | Admitting: *Deleted

## 2023-01-31 NOTE — Telephone Encounter (Signed)
Spoke with patient.  Patient states she has been going back and forth with [facility] for her colonoscopy report, states she finally spoke with someone at their office and report is supposed to be sent to Dr. Edward Jolly today. Patient asking to be notified once report received, states she will need for visit with Urogyn on 12/2. Advised I will send to Dr. Edward Jolly and our office can notify once report received. Patient thankful for call.   Dr. Edward Jolly -will you give report to [facility] to scan in once you have received and reviewed. I can call the patient to update once this has been done.    Cc: Debarah Crape

## 2023-02-05 ENCOUNTER — Other Ambulatory Visit: Payer: Self-pay

## 2023-02-05 MED ORDER — ESTRADIOL 0.5 MG PO TABS: ORAL_TABLET | ORAL | 3 refills | Status: DC

## 2023-02-05 MED ORDER — PROGESTERONE MICRONIZED 100 MG PO CAPS: 100.0000 mg | ORAL_CAPSULE | Freq: Every day | ORAL | 3 refills | Status: DC

## 2023-02-05 NOTE — Telephone Encounter (Signed)
I can give her refills of her estradiol 0.5 mg (take 1/2 tablet daily) and Prometrium 100 mg nightly for one year.  (The estradiol lowest dosage of the pill is 0.5 mg.)   She can decide if she would like to discontinue or not.   There is no harm if she chooses to stop these. It is always harder to come off hormones in the warmer months of the year.

## 2023-02-05 NOTE — Telephone Encounter (Signed)
The colonoscopy report from 01/31/2008 with Dr. Rodena Piety with [facility] indicates that the colon mucosa was unremarkable.  It does state that the bowel prep was inadequate for the colonoscopy, which resulted in limited view of the colon mucosa.    It states that follow up colonoscopy is due in 5 years (which would be 2016) after doing a more aggressive 4 day bowel prep.   This will be scanned in to Epic.

## 2023-02-05 NOTE — Telephone Encounter (Signed)
Per BS comments/results from rx request on 11/8   "Patient opted to discontinue her estrogen at her last office visit.   Conley Simmonds, MD"  Will contact pt about refill requests from pharmacy in case it is coming from pharmacy alone or if pt is making requests.  Spoke w/ pt and she reports that she has been continuing w/ HRT since last visit w/ taking 1/2 of the estradiol 0.5mg  tab and the progesterone 100mg  caps at bedtime due to not wanting to quit it cold [place]. However, advised pt that I would inquire with Dr. Edward Jolly about any risks of stopping at once or if tapering would be recommended and if so, how so? Please advise.

## 2023-02-06 NOTE — Telephone Encounter (Signed)
Call placed to patient, left detailed message, ok per dpr.  Advised per Dr. Edward Jolly. Return call to office at [phone], option 4,  if any additional questions.    Encounter closed.

## 2023-02-06 NOTE — Telephone Encounter (Signed)
Patient left message requesting a copy of colonoscopy report be faxed to Dr. Olena Leatherwood at Urogynecology.   Dr. Edward Jolly- Do you still have the report?   Cc: Debarah Crape

## 2023-02-06 NOTE — Telephone Encounter (Signed)
LDVM on machine per DPR and pt's request w/ BS's recommendations.

## 2023-02-06 NOTE — Telephone Encounter (Signed)
The colonoscopy report should be in my outbox in my office.

## 2023-02-09 NOTE — Telephone Encounter (Signed)
Patient notified.   Dr. Olena Leatherwood notified by [facility] staff message.   Encounter closed.

## 2023-02-09 NOTE — Telephone Encounter (Signed)
Kara Webster -can you scan into patients chart and not send to scan center.

## 2023-02-12 NOTE — Progress Notes (Signed)
GYNECOLOGY  VISIT   HPI: 67 y.o.   Divorced  Caucasian female   G2P2 with No LMP recorded. Patient is postmenopausal.   here for: PUS     GYNECOLOGIC HISTORY: No LMP recorded. Patient is postmenopausal. Contraception:  PMP Menopausal hormone therapy:  estrace, progesterone Last 2 paps:  03/10/21 neg, 06/01/17 neg  History of abnormal Pap or positive HPV:  no Mammogram:   09/14/22 Breast Density Cat B, BI-RADS CAT 1 neg         OB History     Gravida  2   Para  2   Term      Preterm      AB      Living  2      SAB      IAB      Ectopic      Multiple      Live Births                 Patient Active Problem List   Diagnosis Date Noted   Bipolar I disorder with mania (HCC) 12/24/2020   Symptoms, such as flushing, sleeplessness, headache, lack of concentration, associated with the menopause 07/19/2015   Incomplete defecation 10/07/2013   Episodic mood disorder (HCC) 05/28/2013   History of substance abuse (HCC) 05/28/2013    Past Medical History:  Diagnosis Date   Depression    Hypothyroid    RLS (restless legs syndrome)    Squamous cell carcinoma of skin 10/18/2020   Mid Tip of Nose (in situ)    Past Surgical History:  Procedure Laterality Date   BLADDER SURGERY     TUBAL LIGATION      Current Outpatient Medications  Medication Sig Dispense Refill   busPIRone (BUSPAR) 15 MG tablet Take 1 tablet (15 mg total) by mouth 2 (two) times daily. 180 tablet 1   CALCIUM PO Take by mouth.     cholecalciferol (VITAMIN D3) 25 MCG (1000 UNIT) tablet Take 1,000 Units by mouth daily.     DULoxetine (CYMBALTA) 20 MG capsule Take 2 capsules (40 mg total) by mouth daily. 180 capsule 1   estradiol (ESTRACE) 0.5 MG tablet Take 1/2 tablet (0.25 mg) by mouth daily. 45 tablet 3   [START ON 03/20/2023] gabapentin (NEURONTIN) 300 MG capsule Take 1 capsule (300 mg total) by mouth at bedtime. 90 capsule 0   lamoTRIgine (LAMICTAL) 200 MG tablet Take 1 tablet (200 mg  total) by mouth daily. 90 tablet 1   levothyroxine (SYNTHROID) 50 MCG tablet Take 1 tablet (50 mcg total) by mouth daily. 60 tablet 2   lithium carbonate 300 MG capsule Decrease to 1 capsule at bedtime for 2 weeks, then stop.     MAGNESIUM PO Take by mouth.     Multiple Vitamin (MULTIVITAMIN) tablet Take 1 tablet by mouth daily.     progesterone (PROMETRIUM) 100 MG capsule Take 1 capsule (100 mg total) by mouth at bedtime. 90 capsule 3   propranolol (INDERAL) 10 MG tablet Take 1 tab po BID prn anxiety 60 tablet 0   No current facility-administered medications for this visit.     ALLERGIES: Aripiprazole, Codeine, and Cetirizine  Family History  Problem Relation Age of Onset   Cancer Mother        leukemia    Hypertension Mother    Cancer Father        lung    Alcohol abuse Father    Diabetes Sister    Hyperlipidemia  Sister    Hypertension Sister    Depression Brother    Diabetes Brother    Hypertension Brother    Alcohol abuse Maternal Uncle    Alcohol abuse Paternal Aunt    Alcohol abuse Maternal Grandfather    Alcohol abuse Brother     Social History   Socioeconomic History   Marital status: Divorced    Spouse name: Not on file   Number of children: Not on file   Years of education: Not on file   Highest education level: Not on file  Occupational History   Not on file  Tobacco Use   Smoking status: Former    Current packs/day: 0.00    Types: Cigarettes    Quit date: 06/01/1996    Years since quitting: 26.7   Smokeless tobacco: Never  Vaping Use   Vaping status: Never Used  Substance and Sexual Activity   Alcohol use: Not Currently   Drug use: Not Currently   Sexual activity: Not Currently    Partners: Male    Birth control/protection: Post-menopausal    Comment: 1st intercourse- 17, partners- greater than 5, no STD, no abnormal pap, no DES exposure  Other Topics Concern   Not on file  Social History Narrative   Not on file   Social Determinants of  Health   Financial Resource Strain: Not on file  Food Insecurity: Not on file  Transportation Needs: Not on file  Physical Activity: Not on file  Stress: Not on file  Social Connections: Not on file  Intimate Partner Violence: Not on file    Review of Systems  PHYSICAL EXAMINATION:   There were no vitals taken for this visit.    General appearance: alert, cooperative and appears stated age Head: Normocephalic, without obvious abnormality, atraumatic Neck: no adenopathy, supple, symmetrical, trachea midline and thyroid normal to inspection and palpation Lungs: clear to auscultation bilaterally Breasts: normal appearance, no masses or tenderness, No nipple retraction or dimpling, No nipple discharge or bleeding, No axillary or supraclavicular adenopathy Heart: regular rate and rhythm Abdomen: soft, non-tender, no masses,  no organomegaly Extremities: extremities normal, atraumatic, no cyanosis or edema Skin: Skin color, texture, turgor normal. No rashes or lesions Lymph nodes: Cervical, supraclavicular, and axillary nodes normal. No abnormal inguinal nodes palpated Neurologic: Grossly normal  Pelvic: External genitalia:  no lesions              Urethra:  normal appearing urethra with no masses, tenderness or lesions              Bartholins and Skenes: normal                 Vagina: normal appearing vagina with normal color and discharge, no lesions              Cervix: no lesions                Bimanual Exam:  Uterus:  normal size, contour, position, consistency, mobility, non-tender              Adnexa: no mass, fullness, tenderness              Rectal exam: {yes no:314532}.  Confirms.              Anus:  normal sphincter tone, no lesions  Chaperone was present for exam:  {BSCHAPERONE:31226::"Chantilly Linskey F, CMA"}  ASSESSMENT:    PLAN:    {LABS (Optional):23779}  ***  total time was spent for this  patient encounter, including preparation, face-to-face counseling with the  patient, coordination of care, and documentation of the encounter.

## 2023-02-13 ENCOUNTER — Encounter: Payer: Medicare HMO | Admitting: Obstetrics and Gynecology

## 2023-02-19 ENCOUNTER — Encounter: Payer: Self-pay | Admitting: Obstetrics

## 2023-02-19 ENCOUNTER — Ambulatory Visit: Payer: Medicare HMO | Admitting: Obstetrics

## 2023-02-19 VITALS — BP 130/64 | HR 56 | Ht 63.5 in | Wt 134.6 lb

## 2023-02-19 DIAGNOSIS — N816 Rectocele: Secondary | ICD-10-CM | POA: Diagnosis not present

## 2023-02-19 DIAGNOSIS — K5900 Constipation, unspecified: Secondary | ICD-10-CM

## 2023-02-19 DIAGNOSIS — N3941 Urge incontinence: Secondary | ICD-10-CM

## 2023-02-19 DIAGNOSIS — Z9889 Other specified postprocedural states: Secondary | ICD-10-CM | POA: Diagnosis not present

## 2023-02-19 LAB — POCT URINALYSIS DIPSTICK
Bilirubin, UA: NEGATIVE
Blood, UA: NEGATIVE
Glucose, UA: NEGATIVE
Ketones, UA: NEGATIVE
Leukocytes, UA: NEGATIVE
Nitrite, UA: NEGATIVE
Protein, UA: NEGATIVE
Spec Grav, UA: <=1.005 — AB (ref 1.010–1.025)
Urobilinogen, UA: 0.2 U/dL
pH, UA: 6.5 (ref 5.0–8.0)

## 2023-02-19 NOTE — Patient Instructions (Signed)
You have a stage 2 (out of 4) prolapse.  We discussed the fact that it is not life threatening but there are several treatment options. For treatment of pelvic organ prolapse, we discussed options for management including expectant management, conservative management, and surgical management, such as Kegels, a pessary, pelvic floor physical therapy, and specific surgical procedures.     Constipation: Our goal is to achieve formed bowel movements daily or every-other-day.  You may need to try different combinations of the following options to find what works best for you - everybody's body works differently so feel free to adjust the dosages as needed.  Some options to help maintain bowel health include:  Dietary changes (more leafy greens, vegetables and fruits; less processed foods) Fiber supplementation (Benefiber, FiberCon, Metamucil or Psyllium). Start slow and increase gradually to full dose. Over-the-counter agents such as: stool softeners (Docusate or Colace) and/or laxatives (Miralax, milk of magnesia)  "Power Pudding" is a natural mixture that may help your constipation.  To make blend 1 cup applesauce, 1 cup wheat bran, and 3/4 cup prune juice, refrigerate and then take 1 tablespoon daily with a large glass of water as needed.   Women should try to eat at least 21 to 25 grams of fiber a day, while men should aim for 30 to 38 grams a day. You can add fiber to your diet with food or a fiber supplement such as psyllium (metamucil), benefiber, or fibercon.   Here's a look at how much dietary fiber is found in some common foods. When buying packaged foods, check the Nutrition Facts label for fiber content. It can vary among brands.  Fruits Serving size Total fiber (grams)*  Raspberries 1 cup 8.0  Pear 1 medium 5.5  Apple, with skin 1 medium 4.5  Banana 1 medium 3.0  Orange 1 medium 3.0  Strawberries 1 cup 3.0   Vegetables Serving size Total fiber (grams)*  Green peas, boiled 1 cup 9.0   Broccoli, boiled 1 cup chopped 5.0  Turnip greens, boiled 1 cup 5.0  Brussels sprouts, boiled 1 cup 4.0  Potato, with skin, baked 1 medium 4.0  Sweet corn, boiled 1 cup 3.5  Cauliflower, raw 1 cup chopped 2.0  Carrot, raw 1 medium 1.5   Grains Serving size Total fiber (grams)*  Spaghetti, whole-wheat, cooked 1 cup 6.0  Barley, pearled, cooked 1 cup 6.0  Bran flakes 3/4 cup 5.5  Quinoa, cooked 1 cup 5.0  Oat bran muffin 1 medium 5.0  Oatmeal, instant, cooked 1 cup 5.0  Popcorn, air-popped 3 cups 3.5  Brown rice, cooked 1 cup 3.5  Bread, whole-wheat 1 slice 2.0  Bread, rye 1 slice 2.0   Legumes, nuts and seeds Serving size Total fiber (grams)*  Split peas, boiled 1 cup 16.0  Lentils, boiled 1 cup 15.5  Black beans, boiled 1 cup 15.0  Baked beans, canned 1 cup 10.0  Chia seeds 1 ounce 10.0  Almonds 1 ounce (23 nuts) 3.5  Pistachios 1 ounce (49 nuts) 3.0  Sunflower kernels 1 ounce 3.0  *Rounded to nearest 0.5 gram. Source: [employer] for [employer]	[place]    We discussed the symptoms of overactive bladder (OAB), which include urinary urgency, urinary frequency, night-time urination, with or without urge incontinence.  We discussed management including behavioral therapy (decreasing bladder irritants by following a bladder diet, urge suppression strategies, timed voids, bladder retraining), physical therapy, medication; and for refractory cases posterior tibial nerve stimulation, sacral neuromodulation, and intravesical  botulinum toxin injection.
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
## 2023-02-19 NOTE — Assessment & Plan Note (Addendum)
-   splints for bowel movements  - For treatment of pelvic organ prolapse, we discussed options for management including expectant management, conservative management, and surgical management, such as Kegels, a pessary, pelvic floor physical therapy, and specific surgical procedures. - encourgaed Kegel exercises - discussed importance of bowel consistency and treatment of constipation prior to surgical intervention

## 2023-02-19 NOTE — Assessment & Plan Note (Signed)
-   suprapubic incisions from anti-incontinence surgery from 1980s by Dr. Dewaine Conger in [place] per patient. Possible Burch colposuspension and rectus fascial retropubic sling with suprapubic catheter placement - no permanent material or mesh palpated or visualized on vaginal exam - discussed need for urodynamic testing if surgical intervention needed

## 2023-02-19 NOTE — Assessment & Plan Note (Addendum)
-   since childhood and worsened in the past 7-8 years with psych med changes, now BM 1x/week - For constipation, we reviewed the importance of a better bowel regimen.  We also discussed the importance of avoiding chronic straining, as it can exacerbate her pelvic floor symptoms; we discussed treating constipation and straining prior to surgery, as postoperative straining can lead to damage to the repair and recurrence of symptoms. We discussed initiating therapy with increasing fluid intake, fiber supplementation, stool softeners, and laxatives such as miralax.  - reviewed instructions and handout for titration of fiber supplementation and miralax to optimize stool consistency - encouraged squatting position for bowel movements - encouraged pt to schedule colonoscopy for colon cancer screening

## 2023-02-19 NOTE — Assessment & Plan Note (Signed)
-   POCT UA negative, bladder scan 16mL - discussed overactive bladder with Burch colposuspension and autologous slings - We discussed the symptoms of overactive bladder (OAB), which include urinary urgency, urinary frequency, nocturia, with or without urge incontinence.  While we do not know the exact etiology of OAB, several treatment options exist. We discussed management including behavioral therapy (decreasing bladder irritants, urge suppression strategies, timed voids, bladder retraining), physical therapy, medication; for refractory cases posterior tibial nerve stimulation, sacral neuromodulation, and intravesical botulinum toxin injection.  For anticholinergic medications, we discussed the potential side effects of anticholinergics including dry eyes, dry mouth, constipation, cognitive impairment and urinary retention. For Beta-3 agonist medication, we discussed the potential side effect of elevated blood pressure which is more likely to occur in individuals with uncontrolled hypertension. - encouraged caffeine reduction, Kegel exercises, bladder training - pt declines medications at this time

## 2023-02-22 DIAGNOSIS — F418 Other specified anxiety disorders: Secondary | ICD-10-CM | POA: Diagnosis not present

## 2023-02-22 DIAGNOSIS — F3289 Other specified depressive episodes: Secondary | ICD-10-CM | POA: Diagnosis not present

## 2023-02-27 DIAGNOSIS — F3289 Other specified depressive episodes: Secondary | ICD-10-CM | POA: Diagnosis not present

## 2023-02-27 DIAGNOSIS — F418 Other specified anxiety disorders: Secondary | ICD-10-CM | POA: Diagnosis not present

## 2023-03-06 DIAGNOSIS — F3289 Other specified depressive episodes: Secondary | ICD-10-CM | POA: Diagnosis not present

## 2023-03-06 DIAGNOSIS — F418 Other specified anxiety disorders: Secondary | ICD-10-CM | POA: Diagnosis not present

## 2023-03-12 DIAGNOSIS — E039 Hypothyroidism, unspecified: Secondary | ICD-10-CM | POA: Diagnosis not present

## 2023-03-12 DIAGNOSIS — R5383 Other fatigue: Secondary | ICD-10-CM | POA: Diagnosis not present

## 2023-03-12 DIAGNOSIS — Z1211 Encounter for screening for malignant neoplasm of colon: Secondary | ICD-10-CM | POA: Diagnosis not present

## 2023-04-13 ENCOUNTER — Encounter: Payer: Self-pay | Admitting: Gastroenterology

## 2023-04-20 ENCOUNTER — Telehealth: Payer: Self-pay

## 2023-04-20 DIAGNOSIS — R21 Rash and other nonspecific skin eruption: Secondary | ICD-10-CM

## 2023-04-20 NOTE — Telephone Encounter (Signed)
Med refill request: clobetasol cream 0.05%  Last AEX: 01/01/23 Next AEX: none scheduled Last MMG (if hormonal med) n/a Patient left RF request on RX line.  Spoke to patient:  patient complains of rash under breasts and requests RX that Dr. Seymour Bars prescribed in past (clobetasol cream 0.05% Rx'd 02/2020).  Sent to provider for approval or denial as appropriate.

## 2023-04-20 NOTE — Telephone Encounter (Signed)
-----   Message from McNeal L sent at 04/19/2023  3:11 PM EST ----- Regarding: VM left by pt This pt left a VM on the scheduling line wanting a nurse to call her about a prescription. If someone would please call her back [phone].  Thank you Marchelle Folks

## 2023-04-20 NOTE — Telephone Encounter (Signed)
 LVMTCB

## 2023-04-21 NOTE — Telephone Encounter (Signed)
Please have patient schedule a brief office visit for me to evaluate her skin rash.   Rx denied for now.

## 2023-04-23 NOTE — Telephone Encounter (Signed)
Spoke with patient, advised per Dr. Edward Jolly. Patient angry and upset that Rx can not be refilled. Patient states she has been on this medication for a long time and the medication is effective. Patient expressed she will not be returning, will get Rx from another provider.  Explained to patient need for OV for further evaluation of rash to ensure nothing more serious with the breast. Explained this medication has not been prescribed by Dr. Edward Jolly for this rash in the past. Patient states she does not care, declines OV. Advised I will send to Dr. Edward Jolly for review and f/u with any additional recommendations. Patient agreeable.   Routing FYI. Encounter closed.

## 2023-04-23 NOTE — Telephone Encounter (Signed)
See second telephone encounter dated 04/23/23.   Encounter closed.

## 2023-04-24 ENCOUNTER — Encounter: Payer: Self-pay | Admitting: Obstetrics

## 2023-04-24 ENCOUNTER — Ambulatory Visit: Payer: 59 | Admitting: Obstetrics

## 2023-04-24 VITALS — BP 118/77 | HR 66

## 2023-04-24 DIAGNOSIS — N816 Rectocele: Secondary | ICD-10-CM

## 2023-04-24 DIAGNOSIS — K5900 Constipation, unspecified: Secondary | ICD-10-CM

## 2023-04-24 DIAGNOSIS — R52 Pain, unspecified: Secondary | ICD-10-CM | POA: Diagnosis not present

## 2023-04-24 DIAGNOSIS — R159 Full incontinence of feces: Secondary | ICD-10-CM

## 2023-04-24 NOTE — Progress Notes (Signed)
 [facility] Return Visit  SUBJECTIVE  History of Present Illness: Kara Webster is a 68 y.o. female seen in follow-up for urgency urinary incontinence, history of midurethral sling, stage II pelvic organ prolapse, and constipation. Plan at last visit was fiber supplementation.   Started miralax 0.5 capful every 2 day with improved symptoms and decreased abdominal fullness. Reports bowel frequency with 1 capful/day Reports most bothersome symptoms of fecal smearing/accidental bowel leakage every 2 weeks after bowel movement  Pending colonoscopy  Past Medical History: Patient  has a past medical history of Depression, Hypothyroid, RLS (restless legs syndrome), and Squamous cell carcinoma of skin (10/18/2020).   Past Surgical History: She  has a past surgical history that includes Bladder surgery (1980) and Tubal ligation.   Medications: She has a current medication list which includes the following prescription(s): calcium, cholecalciferol, duloxetine , estradiol , gabapentin , levothyroxine , magnesium , multivitamin, and progesterone .   Allergies: Patient is allergic to aripiprazole, codeine, oxycodone hcl, and cetirizine.   Social History: Patient  reports that she quit smoking about 26 years ago. Her smoking use included cigarettes. She has never used smokeless tobacco. She reports that she does not currently use alcohol. She reports that she does not currently use drugs.     OBJECTIVE     Physical Exam: Vitals:   04/24/23 1101  BP: 118/77  Pulse: 66   Gen: No apparent distress, A&O x 3.  Detailed Urogynecologic Evaluation:  Deferred. Prior exam showed:      No data to display             ASSESSMENT AND PLAN    Ms. Messner is a 68 y.o. with:  1. Pelvic organ prolapse quantification stage 2 rectocele   2. Constipation, unspecified constipation type   3. Incontinence of feces, unspecified fecal incontinence type     Pelvic organ prolapse quantification  stage 2 rectocele Assessment & Plan: - splints for bowel movements  - For treatment of pelvic organ prolapse, we discussed options for management including expectant management, conservative management, and surgical management, such as Kegels, a pessary, pelvic floor physical therapy, and specific surgical procedures. - encourgaed Kegel exercises - referral to pelvic floor PT, proceed if no improvement after fiber supplementation and Kegel exercises - discussed importance of bowel consistency and treatment of constipation prior to surgical intervention - encouraged to consider trial of pessary to assess impact of prolapse treatment on bowel leakage   Orders: -     AMB referral to rehabilitation  Constipation, unspecified constipation type Assessment & Plan: - since childhood and worsened in the past 7-8 yrs with psych med changes with BM 1x/wk improved to daily with reduced abdominal bloating on miralax 0.5 capful every 2 days with Bristol IV or V stool  - switch to fiber supplementation to assess bowel symptoms - For constipation, we reviewed the importance of a better bowel regimen.  We also discussed the importance of avoiding chronic straining, as it can exacerbate her pelvic floor symptoms; we discussed treating constipation and straining prior to surgery, as postoperative straining can lead to damage to the repair and recurrence of symptoms. We discussed initiating therapy with increasing fluid intake, fiber supplementation, stool softeners, and laxatives such as miralax.  - encouraged squatting position for bowel movements - pending colonoscopy for colon cancer screening - reviewed increased risk of recurrence of pelvic organ prolapse   Orders: -     AMB referral to rehabilitation  Incontinence of feces, unspecified fecal incontinence type Assessment & Plan: -  reports fecal smearing - Treatment options include anti-diarrhea medication (loperamide/ Imodium OTC or prescription  lomotil), fiber supplements, physical therapy, and possible sacral neuromodulation or surgery.   - explained that it may or may not improve after treatment for prolapse - referral placed for pelvic floor PT - discussed SNM and success rate in the presence of sphincter injury, discussed risks and benefits of sphincteroplasty     Time spent: I spent 22 minutes dedicated to the care of this patient on the date of this encounter to include pre-visit review of records, face-to-face time with the patient discussing  and post visit documentation and ordering medication/ testing.    Lianne ONEIDA Gillis, MD

## 2023-04-24 NOTE — Assessment & Plan Note (Signed)
-   reports fecal smearing - Treatment options include anti-diarrhea medication (loperamide/ Imodium OTC or prescription lomotil), fiber supplements, physical therapy, and possible sacral neuromodulation or surgery.   - explained that it may or may not improve after treatment for prolapse - referral placed for pelvic floor PT - discussed SNM and success rate in the presence of sphincter injury, discussed risks and benefits of sphincteroplasty

## 2023-04-24 NOTE — Assessment & Plan Note (Addendum)
-   since childhood and worsened in the past 7-8 yrs with psych med changes with BM 1x/wk improved to daily with reduced abdominal bloating on miralax 0.5 capful every 2 days with Bristol IV or V stool  - switch to fiber supplementation to assess bowel symptoms - For constipation, we reviewed the importance of a better bowel regimen.  We also discussed the importance of avoiding chronic straining, as it can exacerbate her pelvic floor symptoms; we discussed treating constipation and straining prior to surgery, as postoperative straining can lead to damage to the repair and recurrence of symptoms. We discussed initiating therapy with increasing fluid intake, fiber supplementation, stool softeners, and laxatives such as miralax.  - encouraged squatting position for bowel movements - pending colonoscopy for colon cancer screening - reviewed increased risk of recurrence of pelvic organ prolapse

## 2023-04-24 NOTE — Assessment & Plan Note (Signed)
-   splints for bowel movements  - For treatment of pelvic organ prolapse, we discussed options for management including expectant management, conservative management, and surgical management, such as Kegels, a pessary, pelvic floor physical therapy, and specific surgical procedures. - encourgaed Kegel exercises - referral to pelvic floor PT, proceed if no improvement after fiber supplementation and Kegel exercises - discussed importance of bowel consistency and treatment of constipation prior to surgical intervention - encouraged to consider trial of pessary to assess impact of prolapse treatment on bowel leakage

## 2023-04-24 NOTE — Patient Instructions (Signed)
 Switch over to fiber supplementation.   Women should try to eat at least 21 to 25 grams of fiber a day, while men should aim for 30 to 38 grams a day. You can add fiber to your diet with food or a fiber supplement such as psyllium (metamucil), benefiber, or fibercon.   Here's a look at how much dietary fiber is found in some common foods. When buying packaged foods, check the Nutrition Facts label for fiber content. It can vary among brands.  Fruits Serving size Total fiber (grams)*  Raspberries 1 cup 8.0  Pear 1 medium 5.5  Apple, with skin 1 medium 4.5  Banana 1 medium 3.0  Orange 1 medium 3.0  Strawberries 1 cup 3.0   Vegetables Serving size Total fiber (grams)*  Green peas, boiled 1 cup 9.0  Broccoli, boiled 1 cup chopped 5.0  Turnip greens, boiled 1 cup 5.0  Brussels sprouts, boiled 1 cup 4.0  Potato, with skin, baked 1 medium 4.0  Sweet corn, boiled 1 cup 3.5  Cauliflower, raw 1 cup chopped 2.0  Carrot, raw 1 medium 1.5   Grains Serving size Total fiber (grams)*  Spaghetti, whole-wheat, cooked 1 cup 6.0  Barley, pearled, cooked 1 cup 6.0  Bran flakes 3/4 cup 5.5  Quinoa, cooked 1 cup 5.0  Oat bran muffin 1 medium 5.0  Oatmeal, instant, cooked 1 cup 5.0  Popcorn, air-popped 3 cups 3.5  Brown rice, cooked 1 cup 3.5  Bread, whole-wheat 1 slice 2.0  Bread, rye 1 slice 2.0   Legumes, nuts and seeds Serving size Total fiber (grams)*  Split peas, boiled 1 cup 16.0  Lentils, boiled 1 cup 15.5  Black beans, boiled 1 cup 15.0  Baked beans, canned 1 cup 10.0  Chia seeds 1 ounce 10.0  Almonds 1 ounce (23 nuts) 3.5  Pistachios 1 ounce (49 nuts) 3.0  Sunflower kernels 1 ounce 3.0  *Rounded to nearest 0.5 gram. Source: [employer] for Standard Reference, Legacy Release    Please call pelvic floor PT to schedule an appointment.

## 2023-05-03 ENCOUNTER — Other Ambulatory Visit: Payer: Self-pay

## 2023-05-03 ENCOUNTER — Encounter (HOSPITAL_COMMUNITY): Payer: Self-pay

## 2023-05-03 ENCOUNTER — Emergency Department (HOSPITAL_COMMUNITY)
Admission: EM | Admit: 2023-05-03 | Discharge: 2023-05-06 | Disposition: A | Discharge status: Other Acute Inpatient Hospital | Payer: Medicare HMO | Attending: Emergency Medicine | Admitting: Emergency Medicine

## 2023-05-03 DIAGNOSIS — E039 Hypothyroidism, unspecified: Secondary | ICD-10-CM | POA: Diagnosis not present

## 2023-05-03 DIAGNOSIS — R454 Irritability and anger: Secondary | ICD-10-CM | POA: Insufficient documentation

## 2023-05-03 DIAGNOSIS — Z79899 Other long term (current) drug therapy: Secondary | ICD-10-CM | POA: Insufficient documentation

## 2023-05-03 DIAGNOSIS — F0390 Unspecified dementia without behavioral disturbance: Secondary | ICD-10-CM | POA: Diagnosis not present

## 2023-05-03 DIAGNOSIS — R45850 Homicidal ideations: Secondary | ICD-10-CM | POA: Insufficient documentation

## 2023-05-03 DIAGNOSIS — F331 Major depressive disorder, recurrent, moderate: Secondary | ICD-10-CM | POA: Diagnosis present

## 2023-05-03 DIAGNOSIS — F6381 Intermittent explosive disorder: Secondary | ICD-10-CM | POA: Diagnosis not present

## 2023-05-03 DIAGNOSIS — Z87891 Personal history of nicotine dependence: Secondary | ICD-10-CM | POA: Diagnosis not present

## 2023-05-03 DIAGNOSIS — F411 Generalized anxiety disorder: Secondary | ICD-10-CM

## 2023-05-03 DIAGNOSIS — F3162 Bipolar disorder, current episode mixed, moderate: Secondary | ICD-10-CM | POA: Diagnosis not present

## 2023-05-03 HISTORY — DX: Bipolar disorder, unspecified: F31.9

## 2023-05-03 LAB — COMPREHENSIVE METABOLIC PANEL
ALT: 10 U/L (ref 0–44)
AST: 24 U/L (ref 15–41)
Albumin: 3.8 g/dL (ref 3.5–5.0)
Alkaline Phosphatase: 48 U/L (ref 38–126)
Anion gap: 8 (ref 5–15)
BUN: 13 mg/dL (ref 8–23)
CO2: 27 mmol/L (ref 22–32)
Calcium: 9.5 mg/dL (ref 8.9–10.3)
Chloride: 106 mmol/L (ref 98–111)
Creatinine, Ser: 0.96 mg/dL (ref 0.44–1.00)
GFR, Estimated: >60 mL/min (ref >60)
Glucose, Bld: 85 mg/dL (ref 70–99)
Potassium: 4.5 mmol/L (ref 3.5–5.1)
Sodium: 141 mmol/L (ref 135–145)
Total Bilirubin: 0.5 mg/dL (ref 0.0–1.2)
Total Protein: 6.3 g/dL — ABNORMAL LOW (ref 6.5–8.1)

## 2023-05-03 LAB — CBC
HCT: 38.8 % (ref 36.0–46.0)
Hemoglobin: 12.5 g/dL (ref 12.0–15.0)
MCH: 29.6 pg (ref 26.0–34.0)
MCHC: 32.2 g/dL (ref 30.0–36.0)
MCV: 91.7 fL (ref 80.0–100.0)
Platelets: 331 10*3/uL (ref 150–400)
RBC: 4.23 MIL/uL (ref 3.87–5.11)
RDW: 13 % (ref 11.5–15.5)
WBC: 5.7 10*3/uL (ref 4.0–10.5)
nRBC: 0 % (ref 0.0–0.2)

## 2023-05-03 LAB — RAPID URINE DRUG SCREEN, HOSP PERFORMED
Amphetamines: NOT DETECTED
Barbiturates: NOT DETECTED
Benzodiazepines: NOT DETECTED
Cocaine: NOT DETECTED
Opiates: NOT DETECTED
Tetrahydrocannabinol: NOT DETECTED

## 2023-05-03 LAB — ETHANOL: Alcohol, Ethyl (B): <10 mg/dL (ref <10)

## 2023-05-03 LAB — ACETAMINOPHEN LEVEL: Acetaminophen (Tylenol), Serum: <10 ug/mL — ABNORMAL LOW (ref 10–30)

## 2023-05-03 LAB — SALICYLATE LEVEL: Salicylate Lvl: <7 mg/dL — ABNORMAL LOW (ref 7.0–30.0)

## 2023-05-03 LAB — TSH: TSH: 0.216 u[IU]/mL — ABNORMAL LOW (ref 0.350–4.500)

## 2023-05-03 MED ORDER — LEVOTHYROXINE SODIUM 25 MCG PO TABS: 50.0000 ug | ORAL_TABLET | Freq: Every day | ORAL | Status: DC

## 2023-05-03 MED ORDER — GABAPENTIN 300 MG PO CAPS
300.0000 mg | ORAL_CAPSULE | Freq: Every day | ORAL | Status: DC
Start: 2023-05-03 — End: 2023-05-06
  Administered 2023-05-03 – 2023-05-05 (×3): 300 mg via ORAL
  Filled 2023-05-03 (×3): qty 1

## 2023-05-03 MED ORDER — LEVOTHYROXINE SODIUM 50 MCG PO TABS
50.0000 ug | ORAL_TABLET | Freq: Every day | ORAL | Status: DC
  Administered 2023-05-03 – 2023-05-06 (×4): 50 ug via ORAL
  Filled 2023-05-03 (×3): qty 1
  Filled 2023-05-03: qty 2

## 2023-05-03 MED ORDER — ZIPRASIDONE MESYLATE 20 MG IM SOLR
20.0000 mg | Freq: Once | INTRAMUSCULAR | Status: AC
  Administered 2023-05-03: 20 mg via INTRAMUSCULAR
  Filled 2023-05-03: qty 20

## 2023-05-03 NOTE — ED Notes (Signed)
Reports she hates her daughter and reports only taking duloxetine, gabapentin and lamictal now but was on lithium, buspar and others.
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
## 2023-05-03 NOTE — ED Notes (Signed)
Patient's belongings are located in locker #6.

## 2023-05-03 NOTE — ED Notes (Signed)
Sitter is at bedside at this time. Patient is changed into purple scrubs and is calm.
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
## 2023-05-03 NOTE — ED Provider Notes (Signed)
Assumed care from Dr. Suezanne Jacquet and patient was here under IVC due to the desire to want to hurt people.  She did require Geodon due to agitation and attempting to leave.  I independently interpreted patient's labs and CBC, CMP, drug screen, alcohol, acetaminophen and salicylate levels are all within normal limits.  Patient's vital signs were reassuring.  At this time patient is medically clear.   Gwyneth Sprout, MD 05/03/23 [phone]

## 2023-05-04 ENCOUNTER — Encounter (HOSPITAL_COMMUNITY): Payer: Self-pay | Admitting: Psychiatry

## 2023-05-04 DIAGNOSIS — F331 Major depressive disorder, recurrent, moderate: Secondary | ICD-10-CM

## 2023-05-04 DIAGNOSIS — F3162 Bipolar disorder, current episode mixed, moderate: Secondary | ICD-10-CM | POA: Diagnosis present

## 2023-05-04 DIAGNOSIS — F6381 Intermittent explosive disorder: Secondary | ICD-10-CM

## 2023-05-04 MED ORDER — DULOXETINE HCL 20 MG PO CPEP
40.0000 mg | ORAL_CAPSULE | Freq: Every day | ORAL | Status: DC
  Administered 2023-05-04 – 2023-05-06 (×3): 40 mg via ORAL
  Filled 2023-05-04 (×3): qty 2

## 2023-05-04 MED ORDER — LAMOTRIGINE 100 MG PO TABS: 100.0000 mg | ORAL_TABLET | Freq: Every day | ORAL | Status: DC

## 2023-05-04 MED ORDER — DIVALPROEX SODIUM 500 MG PO DR TAB
500.0000 mg | DELAYED_RELEASE_TABLET | Freq: Two times a day (BID) | ORAL | Status: DC
  Administered 2023-05-04 – 2023-05-06 (×4): 500 mg via ORAL
  Filled 2023-05-04 (×4): qty 1

## 2023-05-04 MED ORDER — DIVALPROEX SODIUM 250 MG PO DR TAB
250.0000 mg | DELAYED_RELEASE_TABLET | Freq: Every day | ORAL | Status: DC
  Administered 2023-05-04: 250 mg via ORAL
  Filled 2023-05-04: qty 1

## 2023-05-04 MED ORDER — ACETAMINOPHEN 325 MG PO TABS
650.0000 mg | ORAL_TABLET | Freq: Four times a day (QID) | ORAL | Status: DC | PRN
  Administered 2023-05-04 (×2): 650 mg via ORAL
  Filled 2023-05-04 (×2): qty 2

## 2023-05-04 NOTE — ED Notes (Signed)
Left VM for [facility] department for transport.

## 2023-05-04 NOTE — ED Notes (Signed)
IVC is current

## 2023-05-04 NOTE — ED Notes (Signed)
Patient is resting comfortably.

## 2023-05-04 NOTE — Progress Notes (Signed)
LCSW Progress Note  [id number]   LEXIANNA WEINRICH  05/04/2023  9:44 AM  Description:   Inpatient Psychiatric Referral  Patient was recommended inpatient per  [facility] Candis Shine (NP). There are no available beds at [facility], per [facility] RN. Patient was referred to the following out of network facilities:   Destination  Service Provider Address Phone Fax  [facility] Patient Placement [facility] [place], [place] [phone]  [facility] [address] [place], [place] [address] [phone]  [facility] [address] [place] [address] [phone]  [facility] [address] [place] [address] [phone]  [facility] [address] [place] [address] [phone]  [facility] [address] [place] [address] [phone]  [facility] EFAX [address] [place], [place] [phone]  [facility] [address] [place] [address] [phone]  [facility] [address] [place], [place] [address] [phone]  [facility] [address], [place] [address] [phone]  [facility] [address], [place] [address] [phone]      Situation ongoing, CSW to continue following and update chart as more information becomes available.      [place] Durelle Zepeda, MSW, LCSW  05/04/2023 9:44 AM

## 2023-05-04 NOTE — ED Provider Notes (Signed)
Emergency Medicine Observation Re-evaluation Note  Kara Webster is a 68 y.o. female, seen on rounds today.  Pt initially presented to the ED for complaints of Psychiatric Evaluation Currently, the patient is awaiting placement.  Asking for coffee this AM.  Tearful she is in ED. Calm and no events overnight.   Physical Exam  BP 127/67 (BP Location: Left Arm)   Pulse 75   Temp 98.3 F (36.8 C) (Oral)   Resp 20   Ht 5' 3.5" (1.613 m)   Wt 60.8 kg   SpO2 96%   BMI 23.36 kg/m  Physical Exam General: NAD Cardiac: RR Lungs: even unlabored Psych: tearful but calm  ED Course / MDM  EKG:   I have reviewed the labs performed to date as well as medications administered while in observation.  Recent changes in the last 24 hours include none .  Plan  Current plan is for inpt awaiting placement. Is under IVC    Alvira Monday, MD 05/04/23 [phone]

## 2023-05-04 NOTE — Consult Note (Signed)
[facility] Consult Follow-up  Patient Name: .Kara Webster  MRN: [id number]  DOB: [birth year]  Consult Order details:  Orders (From admission, onward)     Start     Ordered   05/03/23 1615  CONSULT TO CALL ACT TEAM       Ordering Provider: Gwyneth Sprout, MD  Provider:  (Not yet assigned)  Question:  Reason for Consult?  Answer:  Psych consult   05/03/23 1615             Mode of Visit: In person    Psychiatry Consult Evaluation  Service Date: May 04, 2023 LOS:  LOS: 0 days  Chief Complaint: "I'm just so sick of stupid shit"  Primary Psychiatric Diagnoses    Bipolar 1 disorder, mixed, moderate (HCC)  Assessment   Kara Webster is a 68 y.o. Caucasian female with past psychiatric history of bipolar 1 disorder, GAD, MDD, and intermittent explosive disorder, and pertinent past medical history/comorbidities that include hypothyroidism, who presented this encounter by way of self, for endorsements of growing instability of her mental health in the context of worsening thoughts of harming others out of anger. Patient currently is under involuntary commitment and medically cleared.  Upon reevaluation, patient presents with symptomology most consistent with the patient's chronic illness course of bipolar 1 disorder, mixed, moderate.  Evidence of this is appreciable from the patient's endorsements of tremendous difficulties with growing affective instability, growing difficulties with thoughts of harming others and/or herself, growing and worsening depression/anxiety/anger, and growing difficulties with her relationships with others in her life.  Will restart the patient Cymbalta at this time, in addition to increasing the patient's Depakote.  Patient has been accepted to [facility] for transfer tomorrow.  Diagnoses:  Active Hospital problems: Principal Problem:   Bipolar 1 disorder, mixed, moderate (HCC)    Plan   ## Psychiatric Medication Recommendations:    -Continue/increase Depakote ER 250 mg p.o. nightly --> Depakote DR 500 mg p.o. twice daily -Continue/restart Cymbalta 40 mg p.o. daily -Continue gabapentin 300 mg p.o. nightly  ## Medical Decision Making Capacity: Not specifically addressed in this encounter  ## Further Work-up:   -Recommend valproic acid level in 4 days for therapeutic monitoring; goal to obtain a level between 50 to 125 mcg/mL  ## Disposition:-- We recommend inpatient psychiatric hospitalization after medical hospitalization. Patient has been involuntarily committed on 05/03/2023.   ## Behavioral / Environmental: -Standard safety/agitation protocols    ## Safety and Observation Level:  - Based on my clinical evaluation, I estimate the patient to be at low risk of self harm in the current setting. - At this time, we recommend  1:1 Observation. This decision is based on my review of the chart including patient's history and current presentation, interview of the patient, mental status examination, and consideration of suicide risk including evaluating suicidal ideation, plan, intent, suicidal or self-harm behaviors, risk factors, and protective factors. This judgment is based on our ability to directly address suicide risk, implement suicide prevention strategies, and develop a safety plan while the patient is in the clinical setting. Please contact our team if there is a concern that risk level has changed.  CSSR Risk Category:C-SSRS RISK CATEGORY: No Risk  Suicide Risk Assessment: Patient has following modifiable risk factors for suicide: under treated depression , social isolation, recklessness, medication noncompliance, and lack of access to outpatient mental health resources, which we are addressing by treatment recommendations. Patient has following non-modifiable or demographic risk factors for suicide: separation  or divorce and psychiatric hospitalization Patient has the following protective factors against  suicide: Supportive family, Supportive friends, no history of suicide attempts, and no history of NSSIB  Thank you for this consult request. Recommendations have been communicated to the primary team.  We will continue to follow at this time.   Lenox Ponds, NP       History of Present Illness   Kara Webster is a 68 y.o. Caucasian female with past psychiatric history of bipolar 1 disorder, GAD, MDD, and intermittent explosive disorder, and pertinent past medical history/comorbidities that include hypothyroidism, who presented this encounter by way of self, for endorsements of growing instability of her mental health in the context of worsening thoughts of harming others out of anger. Patient currently is under involuntary commitment and medically cleared.  Patient seen today at the [facility] emergency department for face-to-face psychiatric evaluation.  Upon reevaluation, patient endorses that she continues to struggle with nonspecific homicidal thoughts of wanting to hurt, "just stupid motherfuckers, I guess, they just kill me, they drive me nuts," but today notably endorses admittingly, and appreciably with tearful affect, that she does have current thoughts of wishing to be dead, states, "honestly if it wasn't for my granddaughters, I'd be so done with everything, but they need me, I can't go with them needing me."   Patient endorses that she was able to sleep over the night for the most part, but this morning and today thus far has had troubles with appetite and eating.  Patient and this Clinical research associate discussed the new medication added by previous provider of Depakote, as well as were able to confirm her current regimen that she is on, able to affirm to this provider she is up-to-date on Cymbalta and gabapentin, no longer takes lithium, lamictal, and or BuSpar.  Patient endorses good current tolerability of the addition of Depakote, no side effects appreciable.  Patient endorses a depressed,  angry, and anxious mood with a largely congruent affect (intermittently more angry and tearful). Patient orientation is intact, no concerns for fluctuations in consciousness.  Discussed with patient that the plan remains the same, recommendation continues to be for inpatient mental health hospitalization, to which patient verbalized she agreed.  Discussed increasing the patient's Depakote, to which she reported she was amenable to this.  Per chart review/nursing: No safety concerns/behavioral incidents overnight, has been medication compliant, has been largely appropriate.  Review of Systems  Constitutional:  Positive for malaise/fatigue.  Musculoskeletal:  Positive for myalgias.  Psychiatric/Behavioral:  Positive for depression and suicidal ideas (Admits to thoughts of wishing to be dead). Negative for hallucinations and substance abuse. The patient is nervous/anxious. The patient does not have insomnia.   All other systems reviewed and are negative.   Psychiatric and Social History  Psychiatric History:   Information collected from chart review/patient  Prev Dx/Sx: Bipolar 1 disorder mixed, GAD, MDD, intermittent explosive disorder Current Psych Provider: None, recently lost her provider Ms. Montez Morita at [facility] psychiatry Home Meds (current): Lamictal, Cymbalta, gabapentin Previous Med Trials: Lithium, Lamictal, Cymbalta, gabapentin, BuSpar Therapy: Previously [facility], recently attempted [facility] counseling but did not find it helpful  Prior Psych Hospitalization: Yes, most recent one 2022 at [facility] Prior Self Harm: None endorsed Prior Violence: Reportedly required restraints for harming others at [facility]22 hospitalization  Family Psych History: None endorsed or reported Family Hx suicide: None endorsed or reported  Social History:  Developmental Hx: WDL Educational Hx: WDL 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 Living  Situation: Lives alone Spiritual Hx:  None endorsed or reported Access to weapons/lethal means: None endorsed or reported  Substance History Alcohol: States rarely Type of alcohol wine Last Drink states cannot remember Number of drinks per day states cannot remember History of alcohol withdrawal seizures none endorsed History of DT's none in Tobacco: None endorsed Illicit drugs: History of smoking pot, none recently Prescription drug abuse: None endorsed Rehab hx: None endorsed  Exam Findings  Physical Exam: As below Vital Signs:  Temp:  [98.1 F (36.7 C)-98.3 F (36.8 C)] 98.3 F (36.8 C) (02/14 0530) Pulse Rate:  [75-80] 75 (02/14 0530) Resp:  [20] 20 (02/14 0530) BP: (127-128)/(64-67) 127/67 (02/14 0530) SpO2:  [96 %-98 %] 96 % (02/14 0530) Weight:  [60.8 kg] 60.8 kg (02/13 1218) Blood pressure 127/67, pulse 75, temperature 98.3 F (36.8 C), temperature source Oral, resp. rate 20, height 5' 3.5" (1.613 m), weight 60.8 kg, SpO2 96%. Body mass index is 23.36 kg/m.  Physical Exam Vitals and nursing note reviewed.  Constitutional:      General: She is not in acute distress.    Appearance: She is normal weight. She is not ill-appearing, toxic-appearing or diaphoretic.     Comments: Sad, agitated, and intently related interpersonal style  Pulmonary:     Effort: Pulmonary effort is normal.  Skin:    General: Skin is warm and dry.  Neurological:     Mental Status: She is oriented to person, place, and time.     Motor: No tremor or seizure activity.  Psychiatric:        Attention and Perception: Attention and perception normal. She does not perceive auditory or visual hallucinations.        Mood and Affect: Mood is anxious and depressed. Affect is angry.        Speech: Speech normal.        Behavior: Behavior is agitated. Behavior is cooperative.        Thought Content: Thought content is not paranoid. Thought content includes homicidal (Vague) and suicidal (Thoughts of wishing to be dead) ideation. Thought  content does not include homicidal or suicidal plan.        Cognition and Memory: Cognition normal. Memory is impaired.        Judgment: Judgment is impulsive and inappropriate.    Mental Status Exam: General Appearance: Well Groomed and sad, agitated, and intently related interpersonal style  Orientation:  Full (Time, Place, and Person)  Memory:  Immediate;   variable, some mild deficits appreciable Recent;   variable, some mild deficits appreciable Remote;   variable, some mild deficits appreciable  Concentration:  Concentration: Fair and Attention Span: Fair  Recall:   Variable, some mild deficits appreciable  Attention  Fair  Eye Contact:  Fair  Speech:  Clear and Coherent and Normal Rate  Language:  Fair  Volume:  Normal  Mood: Anxious, depressed, angry  Affect:  Congruent  Thought Process:  Coherent, Goal Directed, and Linear  Thought Content:  Negative  Suicidal Thoughts:  Yes.  without intent/plan  Homicidal Thoughts:  Yes.  without intent/plan  Judgement:  Intact  Insight:  Lacking and Shallow  Psychomotor Activity:  Normal  Akathisia:  No  Fund of Knowledge:  Fair      Assets:  Manufacturing systems engineer Desire for Improvement Financial Resources/Insurance Housing Leisure Time Physical Health Resilience Social Support Talents/Skills Transportation Vocational/Educational  Cognition:  Impaired,  Mild  ADL's:  Intact  AIMS (if indicated):   0  Other History   These have been pulled in through the EMR, reviewed, and updated if appropriate.  Family History:  The patient's family history includes Alcohol abuse in her brother, father, maternal grandfather, maternal uncle, and paternal aunt; Cancer in her father and mother; Depression in her brother; Diabetes in her brother and sister; Hyperlipidemia in her sister; Hypertension in her brother, mother, and sister.  Medical History: Past Medical History:  Diagnosis Date   Bipolar 1 disorder (HCC)    Depression     Hypothyroid    RLS (restless legs syndrome)    Squamous cell carcinoma of skin 10/18/2020   Mid Tip of Nose (in situ)    Surgical History: Past Surgical History:  Procedure Laterality Date   BLADDER SURGERY  1980   x2   TUBAL LIGATION       Medications:   Current Facility-Administered Medications:    divalproex (DEPAKOTE) DR tablet 250 mg, 250 mg, Oral, QHS, Forcey, Sherifat, NP, 250 mg at 05/04/23 0139   gabapentin (NEURONTIN) capsule 300 mg, 300 mg, Oral, QHS, Plunkett, Whitney, MD, 300 mg at 05/03/23 1857   levothyroxine (SYNTHROID) tablet 50 mcg, 50 mcg, Oral, Q0600, Gwyneth Sprout, MD, 50 mcg at 05/04/23 1610  Current Outpatient Medications:    CALCIUM PO, Take by mouth., Disp: , Rfl:    cholecalciferol (VITAMIN D3) 25 MCG (1000 UNIT) tablet, Take 1,000 Units by mouth daily., Disp: , Rfl:    DULoxetine (CYMBALTA) 20 MG capsule, Take 2 capsules (40 mg total) by mouth daily., Disp: 180 capsule, Rfl: 1   estradiol (ESTRACE) 0.5 MG tablet, Take 1/2 tablet (0.25 mg) by mouth daily., Disp: 45 tablet, Rfl: 3   gabapentin (NEURONTIN) 300 MG capsule, Take 1 capsule (300 mg total) by mouth at bedtime. (Patient taking differently: Take 300 mg by mouth at bedtime. Pt takes for restless leg), Disp: 90 capsule, Rfl: 0   levothyroxine (SYNTHROID) 50 MCG tablet, Take 1 tablet (50 mcg total) by mouth daily., Disp: 60 tablet, Rfl: 2   MAGNESIUM PO, Take by mouth., Disp: , Rfl:    progesterone (PROMETRIUM) 100 MG capsule, Take 1 capsule (100 mg total) by mouth at bedtime., Disp: 90 capsule, Rfl: 3   Multiple Vitamin (MULTIVITAMIN) tablet, Take 1 tablet by mouth daily. (Patient not taking: Reported on 05/03/2023), Disp: , Rfl:   Allergies: Allergies  Allergen Reactions   Aripiprazole Other (See Comments)    Other reaction(s): dyskinesia   Oxycodone Hcl Other (See Comments)   Cetirizine Rash   Codeine Nausea And Vomiting    Lenox Ponds, NP

## 2023-05-04 NOTE — ED Notes (Signed)
Please call daughter

## 2023-05-04 NOTE — Progress Notes (Signed)
Per Lyla Son at [facility] Intake patient now can arrive at the facility today. Provider, as well RN made aware.   [place] Shiryl Ruddy LCSW-A   05/04/2023 3:19 PM

## 2023-05-04 NOTE — Progress Notes (Addendum)
Pt has been accepted to [facility] 05/05/2023. Bed assignment: Main campus  Pt meets inpatient criteria per: Norval Morton, NP   Attending Physician will be Loni Beckwith, MD  Report can be called to: [phone] (this is a pager, please leave call-back number when giving report)  Pt can arrive after 8 AM  Care Team Notified: Arsenio Loader NP, Shann Medal RN  [place] Burnie Hank LCSW-A   05/04/2023 10:00 AM

## 2023-05-04 NOTE — Consult Note (Addendum)
 [facility] Consult Note  Patient Name: Kara Webster MRN: [id number] DOB: 1955/12/14 DATE OF Consult: 05/04/2023  PRIMARY PSYCHIATRIC DIAGNOSES  - Major Depressive Disorder, Recurrent, Moderate  - Intermittent Explosive Disorder  - Rule out dementia  RECOMMENDATIONS  Admit to inpatient psych for safety and stabilization Medication recommendations: Start Depakote ER 250mg  PO QHS to help with anger and mood stabilization. The potential risks, side effects (including black box warning) benefits and alternative were discussed with the patient, and she verbalized understanding. Check Valproic acid level 5 days after starting Depakote ER. Non-Medication/therapeutic recommendations: Initiate a referral to Neurology to rule out Dementia due to reported cognitive issues.  Refer to outpatient psychiatric provider for medication management and therapy upon discharge from inpatient psych admission.   Thank you for involving [place] in the care of this patient. If you have any additional questions or concerns, please call [phone] and ask for me or the provider on-call.  TELEPSYCHIATRY ATTESTATION & CONSENT  As the provider for this telehealth consult, I attest that I verified the patient's identity using two separate identifiers, introduced myself to the patient, provided my credentials, disclosed my location, and performed this encounter via a HIPAA-compliant, real-time, face-to-face, two-way, interactive audio and video platform and with the full consent and agreement of the patient (or guardian as applicable.)  Patient physical location: [facility]. Telehealth provider physical location: home office in state of [place].  Video start time: 2333 (Central Time) Video end time: 2355 (Central Time)  IDENTIFYING DATA  Kara Webster is a 68 y.o. year-old female for whom a psychiatric consultation has been ordered by the primary provider. The patient was identified using two separate  identifiers.  CHIEF COMPLAINT/REASON FOR CONSULT  - Came to get medication - Tired of therapy - Anger issues - Feels ready to explode - Difficulty getting along with others - Forgetting how to do things which is frustrating  HISTORY OF PRESENT ILLNESS (HPI)  The patient, a 68 year old woman, presented with significant emotional distress and a history of anger management issues. She expressed frustration with her inability to find a consistent psychiatrist and dissatisfaction with therapy, feeling it has been ineffective. The patient reported a pervasive sense of anger, often feeling on the verge of exploding, and acknowledged previous hospitalizations due to her inability to manage these feelings. 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  Her relationship with her daughter, who lives across the street, is a significant source of stress. The patient is deeply concerned about the living conditions her grandchildren are subjected to, describing the environment as filthy and her daughter as neglectful. This situation contributes to her feelings of anger and helplessness, as she feels compelled to step in as a caretaker, which she does willingly out of love for her grandchildren.  The patient also reported symptoms of depression, including feelings of hopelessness, worthlessness, and excessive sleep, although she maintains a routine to care for her grandchildren. She expressed frustration with her current relationship, describing it as stagnant and a source of ongoing stress. Patient also reports having trouble remembering things. She reports finding difficult remembering how to cook her meals and other simple things which causes her to feel very frustrated and angry. Her dissatisfaction extends to her recent experiences with therapy, feeling  that the approaches offered have been unhelpful and trivial.  Regarding her mental health history, the  patient mentioned a previous inpatient psychiatric admission approximately two years ago. She has been on various medications, including duloxetine, and lamotrigine (unable to remember the names of the others), but discontinued many due to side effects, particularly tardive dyskinesia, which she experienced severely in her mouth and limbs. Despite these challenges, she continues to take duloxetine.  The patient expressed a desire for a change in her medication regimen to better manage her anger and depression. A trial of Depakote was discussed as a potential mood stabilizer, with plans to admit her for stabilization and medication adjustment. The patient was advised to consider the impact of her environment on her mental health and the possibility of involving child services due to her concerns about her grandchildren's welfare, although she fears repercussions from her daughter. Patient would not give any further information about her daughter as she is worried if someone calls child services, her daughter will never let her see her grandchildren again.  Overall, the patient is seeking relief from her emotional turmoil and a more effective treatment plan to address her psychiatric symptoms and improve her quality of life.  PAST PSYCHIATRIC HISTORY  - Hospitalized for anger issues approximately two years ago - Currently taking duloxetine, lamotrigine and others she cant remember - Reports past side effects from medications causing tardive dyskinesia - Expressed frustration with therapy and previous therapist - Denies history of suicide attempt  PAST MEDICAL HISTORY  Past Medical History:  Diagnosis Date   Bipolar 1 disorder (HCC)    Depression    Hypothyroid    RLS (restless legs syndrome)    Squamous cell carcinoma of skin 10/18/2020   Mid Tip of Nose (in situ)     HOME MEDICATIONS   Facility Ordered Medications  Medication   [COMPLETED] ziprasidone (GEODON) injection 20 mg   levothyroxine (SYNTHROID) tablet 50 mcg   gabapentin (NEURONTIN) capsule 300 mg   PTA Medications  Medication Sig   cholecalciferol (VITAMIN D3) 25 MCG (1000 UNIT) tablet Take 1,000 Units by mouth daily.   levothyroxine (SYNTHROID) 50 MCG tablet Take 1 tablet (50 mcg total) by mouth daily.   CALCIUM PO Take by mouth.   MAGNESIUM PO Take by mouth.   DULoxetine (CYMBALTA) 20 MG capsule Take 2 capsules (40 mg total) by mouth daily.   gabapentin (NEURONTIN) 300 MG capsule Take 1 capsule (300 mg total) by mouth at bedtime. (Patient taking differently: Take 300 mg by mouth at bedtime. Pt takes for restless leg)   progesterone (PROMETRIUM) 100 MG capsule Take 1 capsule (100 mg total) by mouth at bedtime.   estradiol (ESTRACE) 0.5 MG tablet Take 1/2 tablet (0.25 mg) by mouth daily.   Multiple Vitamin (MULTIVITAMIN) tablet Take 1 tablet by mouth daily. (Patient not taking: Reported on 05/03/2023)     ALLERGIES  Allergies  Allergen Reactions   Aripiprazole Other (See Comments)    Other reaction(s): dyskinesia   Oxycodone Hcl Other (See Comments)   Cetirizine Rash   Codeine Nausea And Vomiting    SOCIAL & SUBSTANCE USE HISTORY  Social History   Socioeconomic History   Marital status: Divorced    Spouse name: Not on file   Number of children: Not on file   Years of education: Not on file   Highest education level: Not on file  Occupational History   Not on file  Tobacco Use   Smoking status: Former    Current packs/day: 0.00    Types: Cigarettes    Quit date: 06/01/1996  Years since quitting: 26.9   Smokeless tobacco: Never  Vaping Use   Vaping status: Never Used  Substance and Sexual Activity   Alcohol use: Not Currently   Drug use: Not Currently   Sexual activity: Not Currently    Partners: Male    Birth control/protection: Post-menopausal    Comment: 1st intercourse- 17,  partners- greater than 5, no STD, no abnormal pap, no DES exposure  Other Topics Concern   Not on file  Social History Narrative   Not on file   Social Drivers of Health   Financial Resource Strain: Not on file  Food Insecurity: Not on file  Transportation Needs: Not on file  Physical Activity: Not on file  Stress: Not on file  Social Connections: Not on file   Social History   Tobacco Use  Smoking Status Former   Current packs/day: 0.00   Types: Cigarettes   Quit date: 06/01/1996   Years since quitting: 26.9  Smokeless Tobacco Never   Social History   Substance and Sexual Activity  Alcohol Use Not Currently   Social History   Substance and Sexual Activity  Drug Use Not Currently    Additional pertinent information .  FAMILY HISTORY  Family History  Problem Relation Age of Onset   Cancer Mother        leukemia    Hypertension Mother    Cancer Father        lung    Alcohol abuse Father    Diabetes Sister    Hyperlipidemia Sister    Hypertension Sister    Depression Brother    Diabetes Brother    Hypertension Brother    Alcohol abuse Brother    Alcohol abuse Maternal Grandfather    Alcohol abuse Maternal Uncle    Alcohol abuse Paternal Aunt    Bladder Cancer Neg Hx    Uterine cancer Neg Hx    Family Psychiatric History (if known):  daughter has ADHD  MENTAL STATUS EXAM (MSE)  Mental Status Exam: General Appearance:  wearing hospital scrubs  Orientation:  Full (Time, Place, and Person)  Memory:  Immediate;   Fair Recent;   Fair Remote;   Fair  Concentration:  Concentration: Fair and Attention Span: Fair  Recall:  Fair  Attention  Fair  Eye Contact:  Fair  Speech:  Clear and Coherent  Language:  Good  Volume:  Normal  Mood:  The mood was described by the patient as "depressed", "angry" and "hopeless."  Affect:  Congruent and Tearful  Thought Process:  Coherent  Thought Content:  Logical  Suicidal Thoughts:  No  Homicidal Thoughts:  No   Judgement:  Fair  Insight:  Fair  Psychomotor Activity:  Normal  Akathisia:  No  Fund of Knowledge:  Fair    Assets:  Communication Skills Desire for Improvement Housing  Cognition:  patient expresses difficulties in memory and task execution, impacting her daily functioning.  ADL's:  reports having difficulty  AIMS (if indicated):       VITALS  Blood pressure 128/64, pulse 80, temperature 98.1 F (36.7 C), temperature source Oral, resp. rate 20, height 5' 3.5" (1.613 m), weight 60.8 kg, SpO2 98%.  LABS  Admission on 05/03/2023  Component Date Value Ref Range Status   Sodium 05/03/2023 141  135 - 145 mmol/L Final   Potassium 05/03/2023 4.5  3.5 - 5.1 mmol/L Final   Chloride 05/03/2023 106  98 - 111 mmol/L Final   CO2 05/03/2023 27  22 -  32 mmol/L Final   Glucose, Bld 05/03/2023 85  70 - 99 mg/dL Final   Glucose reference range applies only to samples taken after fasting for at least 8 hours.   BUN 05/03/2023 13  8 - 23 mg/dL Final   Creatinine, Ser 05/03/2023 0.96  0.44 - 1.00 mg/dL Final   Calcium 40/98/1191 9.5  8.9 - 10.3 mg/dL Final   Total Protein 47/82/9562 6.3 (L)  6.5 - 8.1 g/dL Final   Albumin 13/10/6576 3.8  3.5 - 5.0 g/dL Final   AST 46/96/2952 24  15 - 41 U/L Final   ALT 05/03/2023 10  0 - 44 U/L Final   Alkaline Phosphatase 05/03/2023 48  38 - 126 U/L Final   Total Bilirubin 05/03/2023 0.5  0.0 - 1.2 mg/dL Final   GFR, Estimated 05/03/2023 >60  >60 mL/min Final   Comment: (NOTE) Calculated using the CKD-EPI Creatinine Equation (2021)    Anion gap 05/03/2023 8  5 - 15 Final   Performed at [facility], [address], [place], [place] [address]   Alcohol, Ethyl (B) 05/03/2023 <10  <10 mg/dL Final   Comment: (NOTE) Lowest detectable limit for serum alcohol is 10 mg/dL.  For medical purposes only. Performed at [facility], [address], [place], [place] [address]    Salicylate Lvl 05/03/2023 <7.0 (L)  7.0 - 30.0 mg/dL Final   Performed at  [facility], [address], [place], [place] [address]   Acetaminophen (Tylenol), Serum 05/03/2023 <10 (L)  10 - 30 ug/mL Final   Comment: (NOTE) Therapeutic concentrations vary significantly. A range of 10-30 ug/mL  may be an effective concentration for many patients. However, some  are best treated at concentrations outside of this range. Acetaminophen concentrations >150 ug/mL at 4 hours after ingestion  and >50 ug/mL at 12 hours after ingestion are often associated with  toxic reactions.  Performed at [facility], [address] [place], [place] [address]    WBC 05/03/2023 5.7  4.0 - 10.5 K/uL Final   RBC 05/03/2023 4.23  3.87 - 5.11 MIL/uL Final   Hemoglobin 05/03/2023 12.5  12.0 - 15.0 g/dL Final   HCT 34/74/2595 38.8  36.0 - 46.0 % Final   MCV 05/03/2023 91.7  80.0 - 100.0 fL Final   MCH 05/03/2023 29.6  26.0 - 34.0 pg Final   MCHC 05/03/2023 32.2  30.0 - 36.0 g/dL Final   RDW 63/87/5643 13.0  11.5 - 15.5 % Final   Platelets 05/03/2023 331  150 - 400 K/uL Final   nRBC 05/03/2023 0.0  0.0 - 0.2 % Final   Performed at [facility], [address], [place], [place] [address]   Opiates 05/03/2023 NONE DETECTED  NONE DETECTED Final   Cocaine 05/03/2023 NONE DETECTED  NONE DETECTED Final   Benzodiazepines 05/03/2023 NONE DETECTED  NONE DETECTED Final   Amphetamines 05/03/2023 NONE DETECTED  NONE DETECTED Final   Tetrahydrocannabinol 05/03/2023 NONE DETECTED  NONE DETECTED Final   Barbiturates 05/03/2023 NONE DETECTED  NONE DETECTED Final   Comment: (NOTE) DRUG SCREEN FOR MEDICAL PURPOSES ONLY.  IF CONFIRMATION IS NEEDED FOR ANY PURPOSE, NOTIFY LAB WITHIN 5 DAYS.  LOWEST DETECTABLE LIMITS FOR URINE DRUG SCREEN Drug Class                     Cutoff (ng/mL) Amphetamine and metabolites    1000 Barbiturate and metabolites    200 Benzodiazepine  200 Opiates and metabolites        300 Cocaine and metabolites        300 THC                             50 Performed at [facility], [address] [place], [place] [address]    TSH 05/03/2023 0.216 (L)  0.350 - 4.500 uIU/mL Final   Comment: Performed by a 3rd Generation assay with a functional sensitivity of <=0.01 uIU/mL. Performed at [facility], [address], [place], [place] [address]     PSYCHIATRIC REVIEW OF SYSTEMS (ROS)  ROS: Notable for the following relevant positive findings: ROS  Additional findings:      Musculoskeletal: No abnormal movements observed      Gait & Station: Laying/Sitting      Pain Screening: Denies      Nutrition & Dental Concerns: none reported  RISK FORMULATION/ASSESSMENT  Is the patient experiencing any suicidal or homicidal ideations: No       Explain if yes:  Protective factors considered for safety management: access to appropriate clinical services  Risk factors/concerns considered for safety management:  Depression Hopelessness Aggression  Is there a safety management plan with the patient and treatment team to minimize risk factors and promote protective factors: Yes           Explain: admit to psych, medication management Is crisis care placement or psychiatric hospitalization recommended: Yes     Based on my current evaluation and risk assessment, patient is determined at this time to be at:  Moderate Risk  *RISK ASSESSMENT Risk assessment is a dynamic process; it is possible that this patient's condition, and risk level, may change. This should be re-evaluated and managed over time as appropriate. Please re-consult psychiatric consult services if additional assistance is needed in terms of risk assessment and management. If your team decides to discharge this patient, please advise the patient how to best access emergency psychiatric services, or to call 911, if their condition worsens or they feel unsafe in any way.   Norval Morton, NP Telepsychiatry Consult Services

## 2023-05-04 NOTE — ED Notes (Signed)
Pt advised of pending transfer to [facility] and she is notifying family at this time via phone.

## 2023-05-05 NOTE — ED Notes (Signed)
 Called [facility] secure pager line and left message for a callback for report.

## 2023-05-05 NOTE — ED Notes (Signed)
 Per staffing, no sitters available

## 2023-05-05 NOTE — ED Notes (Signed)
 All belongings given to pt's family member.

## 2023-05-05 NOTE — ED Provider Notes (Signed)
 Emergency Medicine Observation Re-evaluation Note  Kara Webster is a 68 y.o. female, seen on rounds today.  Pt initially presented to the ED for complaints of Psychiatric Evaluation Currently, the patient is resting.  Physical Exam  BP 127/66 (BP Location: Left Arm)   Pulse 85 Comment: 85  Temp 98 F (36.7 C) (Oral)   Resp 17   Ht 5' 3.5" (1.613 m)   Wt 60.8 kg   SpO2 96%   BMI 23.36 kg/m  Physical Exam General: No acute distress Cardiac: Well-perfused Lungs: Nonlabored Psych: Calm  ED Course / MDM  EKG:   I have reviewed the labs performed to date as well as medications administered while in observation.  Recent changes in the last 24 hours include patient has been accepted to [facility].  Plan  Current plan is for awaiting Sheriff for transfer to [facility].    Terrilee Files, MD 05/05/23 1059

## 2023-05-05 NOTE — ED Notes (Signed)
 Left another voicemail on secure pager line for [facility] requesting report.

## 2023-05-06 DIAGNOSIS — F3162 Bipolar disorder, current episode mixed, moderate: Secondary | ICD-10-CM

## 2023-05-06 MED ORDER — DIVALPROEX SODIUM 500 MG PO DR TAB: 500.0000 mg | DELAYED_RELEASE_TABLET | Freq: Two times a day (BID) | ORAL | 0 refills | Status: DC

## 2023-05-06 MED ORDER — DULOXETINE HCL 40 MG PO CPEP: 40.0000 mg | ORAL_CAPSULE | Freq: Every day | ORAL | 0 refills | Status: DC

## 2023-05-06 MED ORDER — GABAPENTIN 300 MG PO CAPS: 300.0000 mg | ORAL_CAPSULE | Freq: Every day | ORAL | 0 refills | Status: AC

## 2023-05-06 MED ORDER — LEVOTHYROXINE SODIUM 50 MCG PO TABS: 50.0000 ug | ORAL_TABLET | Freq: Every day | ORAL | 0 refills | Status: AC

## 2023-05-06 NOTE — Consult Note (Signed)
 [facility] Consult Follow-[facility]  Patient Name: .Kara Webster  MRN: [id number]  DOB: 1955-11-11  Consult Order details:  Orders (From admission, onward)     Start     Ordered   05/03/23 1615  CONSULT TO CALL ACT TEAM       Ordering Provider: Gwyneth Sprout, MD  Provider:  (Not yet assigned)  Question:  Reason for Consult?  Answer:  Psych consult   05/03/23 1615             Mode of Visit: In person    Psychiatry Consult Evaluation  Service Date: May 04, 2023 LOS:  LOS: 3 days  Chief Complaint: "I'm ready to go home"  Primary Psychiatric Diagnoses    Bipolar 1 disorder, mixed, moderate (HCC)  Assessment   Kara Webster is a 68 y.o. Caucasian female with past psychiatric history of bipolar 1 disorder, GAD, MDD, and intermittent explosive disorder, and pertinent past medical history/comorbidities that include hypothyroidism, who presented this encounter by way of self, for endorsements of growing instability of her mental health in the context of worsening thoughts of harming others out of anger. Patient currently is under involuntary commitment and medically cleared.  Upon reevaluation, patient presents with symptomology most consistent with the patient's chronic illness course of bipolar 1 disorder, mixed, moderate.  Evidence of this is appreciable from the patient's endorsements of tremendous difficulties with growing affective instability, growing difficulties with thoughts of harming others and/or herself, growing and worsening depression/anxiety/anger, and growing difficulties with her relationships with others in her life.  Patient has been requesting discharge for over 24 hours. Today, patient continues to deny SI, HI, and AVH. Pt reports tremendous improvement in mood since Depakote initiation. Pt is requesting to continue her treatment in the outpatient setting versus inpatient. Please see details and plan below.     Diagnoses:  [facility] Hospital  problems: Principal Problem:   Bipolar 1 disorder, mixed, moderate (HCC)    Plan   ## Psychiatric Medication Recommendations:   -Continue Depakote DR 500 mg p.o. twice daily -Continue Cymbalta 40 mg p.o. daily -Continue gabapentin 300 mg p.o. nightly  ## Medical Decision Making Capacity: Not specifically addressed in this encounter  ## Further Work-up:  None at this time  ## Disposition:-- no barriers to discharge at this time.   ## Behavioral / Environmental: -Standard safety/agitation protocols    ## Safety and Observation Level:  - Based on my clinical evaluation, I estimate the patient to be at low risk of self harm in the current setting. - At this time, we recommend  1:1 Observation. This decision is based on my review of the chart including patient's history and current presentation, interview of the patient, mental status examination, and consideration of suicide risk including evaluating suicidal ideation, plan, intent, suicidal or self-harm behaviors, risk factors, and protective factors. This judgment is based on our ability to directly address suicide risk, implement suicide prevention strategies, and develop a safety plan while the patient is in the clinical setting. Please contact our team if there is a concern that risk level has changed.  CSSR Risk Category:C-SSRS RISK CATEGORY: No Risk  Suicide Risk Assessment: Patient has following modifiable risk factors for suicide:  and lack of access to outpatient mental health resources, which we are addressing by treatment recommendations and outpatient follow up instructions. Patient has following non-modifiable or demographic risk factors for suicide: separation or divorce and psychiatric hospitalization Patient has the following protective factors against suicide: Supportive  family, Supportive friends, no history of suicide attempts, and no history of NSSIB  Thank you for this consult request. Recommendations have been  communicated to the primary team.  We psych clear for discharge at this time.   Eligha Bridegroom, NP       History of Present Illness   Kara Webster is a 68 y.o. Caucasian female with past psychiatric history of bipolar 1 disorder, GAD, MDD, and intermittent explosive disorder, and pertinent past medical history/comorbidities that include hypothyroidism, who presented this encounter by way of self, for endorsements of growing instability of her mental health in the context of worsening thoughts of harming others out of anger. Patient currently is under involuntary commitment and medically cleared.  Patient seen today at the [facility] emergency department for face-to-face psychiatric evaluation.  Upon reevaluation, patient endorses wishes to discharge. She reports for the last 48 hours she has felt much better, denies any suicidal or homicidal ideations, and would like to continue treatment in the outpatient setting. Pt explains that [facility], and she has to abruptly find a new psychiatrist. She was working on getting her Therapist, music transferred to [facility] in [place], however she states "everyone was unprofessional" and she had to keep going back and forth on the phone with both offices just to try and get her information released. She feels like [facility] was being difficult, and the new office was being very rude to her. She had to go around 1 week without her medications before she "had a meltdown." Pt stated she felt so irritable about this situation she wanted to hurt someone. Pt stated she never had any homicidal ideations or specific plans but she was frustrated, concerned for herself and her safety, and presented to [facility] for medication stabilization.   Since presenting to [facility] 3 days ago patient was started on Depakote for mood stabilization and Cymbalta was increased. Pt stated she has felt more "stable, happy, and feels like a weight has been lifted  from her shoulders." Pt states she never has hurt anyone before, and doesn't plan on it now. She denies HI. Pt also states she has no hx of self injurious behaviors or suicide attempts. Pt stated she has gone through periods of hopelessness, similar to a few days ago, but reports no current SI. She denies access to any weapons or firearms at home. She does report many reasons for living, the most important being her daughter and two grandchildren who live next door. She feels like her daughter does not "care for the children properly" so the patient does watch her grandkids almost daily. Pt stated "I would never do anything to hurt myself my grandbabies need me." Pt does live alone, but states her best friend Darl Pikes would be the one to pick her up and stay with her for a few days.   Pt denies AVH. Denies alcohol or illicit substance use. Pt has been sleeping well since hospital admission. No problems with appetite. We did speak about outpatient follow up, and patient is a candidate for [facility] walk in hours. Pt was extremely thankful for this information, as she did not know about the [facility]. Pt stated she would be at [facility] tomorrow at 0730 for walk in outpatient appointment. Pt has no further concerns at this time and is requesting discharge. She denies SI/HI/AVH, has good follow up plan in place, and has responded well to treatment provided while in the ED.   Briefly spoke with  Darl Pikes, her significant other, who has no safety concerns at this time. Darl Pikes states the patient has never tried hurting herself or others, and if she is wanting to go home Darl Pikes supports that decision. Darl Pikes stated she will be picking her up and staying with the patient for the next few days.   Discussed patient case with Dr. Woodroe Mode, and at this time will psychiatrically clear patient for discharge. Pt has responded positively to medication changes, has no safety concerns at this time, and has a good follow up plan in place. Currently  patient presenting as low risk for self harm. While future psychiatric events cannot be accurately predicted, the patient does not currently require further acute psychiatric care and does not currently meet [facility] involuntary commitment criteria. It is recommended that the patient continue treatment in outpatient care. A follow up plan and crisis plan are in place, have been discussed with the patient, and the patient agrees to the plan at time of discharge.    Per chart review/nursing: No safety concerns/behavioral incidents overnight, has been medication compliant, has been largely appropriate.  Review of Systems  Constitutional:  WNL  Musculoskeletal:  Positive for myalgias.  Psychiatric/Behavioral:  Negative for suicidal or homicidal ideations. Negative for hallucinations and substance abuse. The patient does not have insomnia.   All other systems reviewed and are negative.   Psychiatric and Social History  Psychiatric History:   Information collected from chart review/patient  Prev Dx/Sx: Bipolar 1 disorder mixed, GAD, MDD, intermittent explosive disorder Current Psych Provider: None, recently lost her provider Ms. Montez Morita at [facility] psychiatry Home Meds (current): Lamictal, Cymbalta, gabapentin Previous Med Trials: Lithium, Lamictal, Cymbalta, gabapentin, BuSpar Therapy: Previously [facility], recently attempted [facility] counseling but did not find it helpful  Prior Psych Hospitalization: Yes, most recent one 2022 at [facility] Prior Self Harm: None endorsed Prior Violence: Reportedly required restraints for harming others at [facility] 2022 hospitalization  Family Psych History: None endorsed or reported Family Hx suicide: None endorsed or reported  Social History:  Developmental Hx: WDL Educational Hx: WDL 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 Living Situation: Lives alone Spiritual Hx: None endorsed or reported Access to weapons/lethal means: None  endorsed or reported  Substance History Alcohol: States rarely Type of alcohol wine Last Drink states cannot remember Number of drinks per day states cannot remember History of alcohol withdrawal seizures none endorsed History of DT's none in Tobacco: None endorsed Illicit drugs: History of smoking pot, none recently Prescription drug abuse: None endorsed Rehab hx: None endorsed  Exam Findings  Physical Exam: As below Vital Signs:  Temp:  [98.1 F (36.7 C)-98.3 F (36.8 C)] 98.3 F (36.8 C) (02/14 0530) Pulse Rate:  [75-80] 75 (02/14 0530) Resp:  [20] 20 (02/14 0530) BP: (127-128)/(64-67) 127/67 (02/14 0530) SpO2:  [96 %-98 %] 96 % (02/14 0530) Weight:  [60.8 kg] 60.8 kg (02/13 1218) Blood pressure 127/67, pulse 75, temperature 98.3 F (36.8 C), temperature source Oral, resp. rate 20, height 5' 3.5" (1.613 m), weight 60.8 kg, SpO2 96%. Body mass index is 23.36 kg/m.  Physical Exam Vitals and nursing note reviewed.  Constitutional:      General: She is not in acute distress.    Appearance: She is normal weight. She is not ill-appearing, toxic-appearing or diaphoretic.     Comments: Sad, agitated, and intently related interpersonal style  Pulmonary:     Effort: Pulmonary effort is normal.  Skin:    General: Skin is warm and dry.  Neurological:     Mental Status: She is oriented to person, place, and time.     Motor: No tremor or seizure activity.  Psychiatric:        Attention and Perception: Attention and perception normal. She does not perceive auditory or visual hallucinations.        Mood and Affect: Mood is euthymic. Affect is conguent.        Speech: Speech normal.        Behavior: Behavior is pleasant. Behavior is cooperative.        Thought Content: Thought content is not paranoid. Thought content WDL. Thought content does not include homicidal or suicidal plan.        Cognition and Memory: Cognition normal. Memory is impaired.        Judgment: Judgment is  good     Insight: Insight is good    Mental Status Exam: General Appearance: Well Groomed, pleasant, cooperative   Orientation:  Full (Time, Place, and Person)  Memory:  Immediate;   variable, some mild deficits appreciable Recent;   variable, some mild deficits appreciable Remote;   variable, some mild deficits appreciable  Concentration:  Concentration: Fair and Attention Span: Fair  Recall:  Variable, some mild deficits appreciable  Attention good  Eye Contact:  good  Speech:  Clear and Coherent and Normal Rate  Language:  Fair  Volume:  Normal  Mood: "much better"  Affect:  Congruent  Thought Process:  Coherent, Goal Directed, and Linear  Thought Content:  Negative  Suicidal Thoughts:  None  Homicidal Thoughts:  None  Judgement:  Intact  Insight:  improved, good  Psychomotor Activity:  Normal  Akathisia:  No  Fund of Knowledge:  Fair      Assets:  Manufacturing systems engineer Desire for Improvement Financial Resources/Insurance Housing Leisure Time Physical Health Resilience Social Support Talents/Skills Transportation Vocational/Educational  Cognition:  Impaired,  Mild  ADL's:  Intact  AIMS (if indicated):   0     Other History   These have been pulled in through the EMR, reviewed, and updated if appropriate.  Family History:  The patient's family history includes Alcohol abuse in her brother, father, maternal grandfather, maternal uncle, and paternal aunt; Cancer in her father and mother; Depression in her brother; Diabetes in her brother and sister; Hyperlipidemia in her sister; Hypertension in her brother, mother, and sister.  Medical History: Past Medical History:  Diagnosis Date   Bipolar 1 disorder (HCC)    Depression    Hypothyroid    RLS (restless legs syndrome)    Squamous cell carcinoma of skin 10/18/2020   Mid Tip of Nose (in situ)    Surgical History: Past Surgical History:  Procedure Laterality Date   BLADDER SURGERY  1980   x2   TUBAL  LIGATION       Medications:   Current Facility-Administered Medications:    divalproex (DEPAKOTE) DR tablet 250 mg, 250 mg, Oral, QHS, Forcey, Sherifat, NP, 250 mg at 05/04/23 0139   gabapentin (NEURONTIN) capsule 300 mg, 300 mg, Oral, QHS, Plunkett, Whitney, MD, 300 mg at 05/03/23 1857   levothyroxine (SYNTHROID) tablet 50 mcg, 50 mcg, Oral, Q0600, Gwyneth Sprout, MD, 50 mcg at 05/04/23 1610  Current Outpatient Medications:    CALCIUM PO, Take by mouth., Disp: , Rfl:    cholecalciferol (VITAMIN D3) 25 MCG (1000 UNIT) tablet, Take 1,000 Units by mouth daily., Disp: , Rfl:    DULoxetine (CYMBALTA) 20 MG capsule, Take 2 capsules (40 mg  total) by mouth daily., Disp: 180 capsule, Rfl: 1   estradiol (ESTRACE) 0.5 MG tablet, Take 1/2 tablet (0.25 mg) by mouth daily., Disp: 45 tablet, Rfl: 3   gabapentin (NEURONTIN) 300 MG capsule, Take 1 capsule (300 mg total) by mouth at bedtime. (Patient taking differently: Take 300 mg by mouth at bedtime. Pt takes for restless leg), Disp: 90 capsule, Rfl: 0   levothyroxine (SYNTHROID) 50 MCG tablet, Take 1 tablet (50 mcg total) by mouth daily., Disp: 60 tablet, Rfl: 2   MAGNESIUM PO, Take by mouth., Disp: , Rfl:    progesterone (PROMETRIUM) 100 MG capsule, Take 1 capsule (100 mg total) by mouth at bedtime., Disp: 90 capsule, Rfl: 3   Multiple Vitamin (MULTIVITAMIN) tablet, Take 1 tablet by mouth daily. (Patient not taking: Reported on 05/03/2023), Disp: , Rfl:   Allergies: Allergies  Allergen Reactions   Aripiprazole Other (See Comments)    Other reaction(s): dyskinesia   Oxycodone Hcl Other (See Comments)   Cetirizine Rash   Codeine Nausea And Vomiting

## 2023-05-06 NOTE — ED Notes (Signed)
BH NP at bedside 

## 2023-05-06 NOTE — ED Notes (Signed)
 Called [facility] and left a message on secure pager line for callback for report x 2

## 2023-05-06 NOTE — ED Notes (Signed)
 Notice of Commitment Change e-filed 05/06/23, copied to medical records, entered in log book

## 2023-05-06 NOTE — ED Notes (Signed)
Reviewed discharge instructions with patient. Follow-up care and medications reviewed. Patient  verbalized understanding. Patient A&Ox4, VSS, and ambulatory with steady gait upon discharge.  °

## 2023-05-06 NOTE — Discharge Instructions (Signed)
 Outpatient psychiatric Services  Walk in hours for medication management Monday, Wednesday, Thursday, and Friday from 8:00 AM to 11:00 AM Recommend arriving by by 7:30 AM.  It is first come first serve.    Walk in hours for therapy intake Monday and Wednesday only 8:00 AM to 11:00 AM Encouraged to arrive by 7:30 AM.  It is first come first serve   Inpatient patient psychiatric services The [facility] offers comprehensive behavioral heath care services for mental health and substance abuse treatment.  Social work can also assist with referral to or getting you into a rehabilitation program short or long term  Set designer Resources:  Intensive Outpatient Programs: [employer]      [address] [place], [place] [phone] Both a day and evening program       [facility] Outpatient     [address]        [place], [place] [address] [phone]         ADS: [facility] [address] [place] [phone]  [facility] ACCESS LINE: [phone] or [phone] [address] [place], [place] [address] [email or web address]   Substance Abuse Resources: [facility]  [phone] [facility] [phone] [facility] [phone] Floydene Flock [phone] Residential & Outpatient Substance Abuse Program  [phone]  Psychological Services: [facility]  [phone] [facility]  [phone] [facility], [phone] [place]. [address], [place], ACCESS LINE: [phone] or [phone], [email or web address]  Mobile Crisis Teams:                                        Therapeutic Alternatives         Mobile Crisis Care Unit [phone]             [facility] [address] Ginette Otto [phone]                                          Interventionist [address] [place] [phone]  Self-Help/Support Groups: [facility] [employer] of support groups [phone] (call for more info)  Narcotics Anonymous (NA) Caring [facility] [address] [place] - 2 meetings at this location  Residential Treatment Programs:  [facility]      [address]        [place]       [phone]         [facility] [address], [place] [address] [place], [place]  [address] [phone]  [facility] Treatment Facility  [address] [place], [place] [address] [phone] Admissions: 8am-3pm M-F  [facility]     [address]        [place], [place] [address]       [phone]         [facility] [address] Starling Manns [place], [place] [phone]  [facility] [address] [place], [place] [phone]  [facility] - Intensive Outpatient      [address]     [place], [place]       [phone]  [facility] ([facility]     [address] [place], [place] [phone] or [phone]  Residential Treatment Services (RTS), Medicaid [address] [place], [place] [phone]  [facility] [address]                                               [address] [place] [phone]  [facility] Resources: [phone]               General Therapy                                                Angie Fava, PhD        [address] [place], [place] [address]         [phone]   Insurance  [facility] Behavioral   [address] [place], [place] [address] [phone]  [facility] Recovery [address] [place], [place] [address] [phone] Insurance/Medicaid/sponsorship through [facility]                                               [address]                                        [place], [place] [address]    Therapy/tele-psych/case         [phone]          [facility] [address], [place]  [address]  Adolescent/group home/case management [phone]                                           Creola Corn PhD       General therapy       Insurance   [phone]         Dr. Lolly Mustache, [place], M-F [phone]  [facility]  [employer] [facility] Dept. [address]                 [address]         [address] [place] [address]  [place]                                               Cristobal Goldmann Phone:  [phone]  Phone:  [phone]                   Phone:  [phone]  [facility], [phone] [facility]- [phone]       -     [facility] in [place], [address],             [phone], Insurance

## 2023-05-06 NOTE — ED Notes (Signed)
 Called [facility], received Sgt. Nathan Cole's immediate voicemail, and left a detailed message re trip request to [facility] today.

## 2023-05-06 NOTE — ED Notes (Signed)
 Called [facility] secure pager line requesting report.

## 2023-05-06 NOTE — ED Notes (Signed)
 Pt reports feeling much better since taking depakote and requesting to speak to psychiatry about possibility of doing outpatient treatment vs inpatient treatment. BH NP notified.

## 2023-05-07 ENCOUNTER — Telehealth: Payer: Self-pay | Admitting: *Deleted

## 2023-05-07 NOTE — Telephone Encounter (Signed)
 Pt called related to Rx: lamotrigine and newly prescribed depakote interaction and if she should take them together...EDCM clarified with prescriber, Varney Baas) who advised pt to NOT take together and visit [facility] as discussed at discharge.

## 2023-05-09 ENCOUNTER — Telehealth (HOSPITAL_COMMUNITY): Payer: Self-pay | Admitting: Psychiatry

## 2023-05-09 NOTE — Telephone Encounter (Signed)
 D:  Pt had left vm inquiring about outpatient services for therapy.  A:  Returned pt's call, but there was no answer.  Left pt a vm with the front desk phone number and the MH-IOP case mgr's direct #.

## 2023-05-10 ENCOUNTER — Telehealth (HOSPITAL_COMMUNITY): Payer: Self-pay | Admitting: Psychiatry

## 2023-05-10 NOTE — Telephone Encounter (Signed)
 D:  Patient returned the case manager's call.  Pt reports she was told to call to get f/u with a therapist and psychiatrist.  Case manager informed pt about groups also.  Pt declined, stating she would like to just see a therapist and psychiatrist at this time.  A:  Provided pt with support and the front desk phone #.  Strongly encouraged pt to call case mgr back if she changes her mind in the future about group.  R:  Pt receptive.

## 2023-05-15 ENCOUNTER — Ambulatory Visit (AMBULATORY_SURGERY_CENTER): Payer: Medicare HMO | Admitting: *Deleted

## 2023-05-15 VITALS — Ht 63.5 in | Wt 135.0 lb

## 2023-05-15 DIAGNOSIS — Z1211 Encounter for screening for malignant neoplasm of colon: Secondary | ICD-10-CM

## 2023-05-15 MED ORDER — SUFLAVE 178.7 G PO SOLR: 1.0000 | Freq: Once | ORAL | 0 refills | Status: AC

## 2023-05-15 NOTE — Progress Notes (Signed)
 Pt's name and DOB verified at the beginning of the pre-visit wit 2 identifiers  Pt denies any difficulty with ambulating,sitting, laying down or rolling side to side  Pt has no issues with ambulation   Pt has no issues moving head neck or swallowing  No egg or soy allergy known to patient   No issues known to pt with past sedation with any surgeries or procedures  Pt denies having issues being intubated  No FH of Malignant Hyperthermia  Pt is not on diet pills or shots  Pt is not on home 02   Pt is not on blood thinners   Pt denies issues with constipation   Pt has frequent issues with constipation RN instructed pt to use Miralax per bottles instructions a week before prep days. Pt states they will  Pt is not on dialysis  Pt denise any abnormal heart rhythms   Pt denies any upcoming cardiac testing  Patient's chart reviewed by Cathlyn Parsons CNRA prior to pre-visit and patient appropriate for the [facility].  Pre-visit completed and red dot placed by patient's name on their procedure day (on provider's schedule).    Visit by phon  Pt states weight is 135 lb   IInstructions reviewed. Pt given [facility], [facility] main # and MD on call # prior to instructions.  Pt states understanding of instructions. Instructed to review again prior to procedure. Pt states they will.   Informed pt that they will receive a text or  call from [facility] regarding there prep med.

## 2023-05-17 ENCOUNTER — Ambulatory Visit: Payer: Medicare HMO | Admitting: Obstetrics and Gynecology

## 2023-05-23 ENCOUNTER — Encounter (HOSPITAL_COMMUNITY): Payer: Self-pay

## 2023-05-23 ENCOUNTER — Ambulatory Visit (HOSPITAL_COMMUNITY): Payer: Medicare HMO | Admitting: Family

## 2023-05-24 DIAGNOSIS — F319 Bipolar disorder, unspecified: Secondary | ICD-10-CM | POA: Diagnosis not present

## 2023-05-24 DIAGNOSIS — F411 Generalized anxiety disorder: Secondary | ICD-10-CM | POA: Diagnosis not present

## 2023-05-25 ENCOUNTER — Encounter: Payer: Self-pay | Admitting: Gastroenterology

## 2023-05-29 ENCOUNTER — Encounter: Payer: Self-pay | Admitting: Certified Registered Nurse Anesthetist

## 2023-05-30 ENCOUNTER — Telehealth: Payer: Self-pay | Admitting: Gastroenterology

## 2023-05-30 ENCOUNTER — Ambulatory Visit (INDEPENDENT_AMBULATORY_CARE_PROVIDER_SITE_OTHER): Admitting: Obstetrics and Gynecology

## 2023-05-30 ENCOUNTER — Encounter: Payer: Self-pay | Admitting: Obstetrics and Gynecology

## 2023-05-30 ENCOUNTER — Ambulatory Visit: Admitting: Obstetrics and Gynecology

## 2023-05-30 VITALS — BP 104/60 | HR 58 | Temp 97.5°F | Wt 129.0 lb

## 2023-05-30 DIAGNOSIS — B372 Candidiasis of skin and nail: Secondary | ICD-10-CM

## 2023-05-30 MED ORDER — CLOTRIMAZOLE-BETAMETHASONE 1-0.05 % EX CREA: 1.0000 | TOPICAL_CREAM | Freq: Two times a day (BID) | CUTANEOUS | 0 refills | Status: AC | PRN

## 2023-05-30 NOTE — Telephone Encounter (Signed)
 Inbound call from patient, states she ate about a tablespoon of raw cabbage. Patient would like to know if she can continue procedure tomorrow at 10:30 AM.

## 2023-05-30 NOTE — Telephone Encounter (Signed)
 Called pt to let her know that we could procedure tomorrow. Also went over prep time for procedure tomorrow with patient. Pt verbalized understanding and had no further concerns at the end of the call.

## 2023-05-30 NOTE — Progress Notes (Signed)
 68 y.o. [id number] female on HRT here for rash.  Pt reports a rash under left breast. It looks like a "Heat rash" and she states that was given a cream in the past that really helped. Has tried different creams.   No LMP recorded. Patient is postmenopausal.    OB History  Gravida Para Term Preterm AB Living  2 2 2   2   SAB IAB Ectopic Multiple Live Births      2    # Outcome Date GA Lbr Len/2nd Weight Sex Type Anes PTL Lv  2 Term      Vag-Spont   LIV  1 Term      Vag-Spont   LIV    Past Medical History:  Diagnosis Date   Bipolar 1 disorder (HCC)    Constipation    See's MD for issue and it is resolving per pt OK with issue for  a month now   Depression    Hearing loss    Hypothyroid    Rectocele    RLS (restless legs syndrome)    Squamous cell carcinoma of skin 10/18/2020   Mid Tip of Nose (in situ)    Past Surgical History:  Procedure Laterality Date   BLADDER SURGERY  1980   x2   COLONOSCOPY     TUBAL LIGATION      Current Outpatient Medications on File Prior to Visit  Medication Sig Dispense Refill   CALCIUM PO Take by mouth.     cholecalciferol (VITAMIN D3) 25 MCG (1000 UNIT) tablet Take 1,000 Units by mouth daily.     Cyanocobalamin (CVS B12 GUMMIES PO) Take by mouth.     divalproex (DEPAKOTE) 500 MG DR tablet Take 1 tablet (500 mg total) by mouth 2 (two) times daily. 28 tablet 0   DULoxetine 40 MG CPEP Take 1 capsule (40 mg total) by mouth daily. 14 capsule 0   estradiol (ESTRACE) 0.5 MG tablet Take 1/2 tablet (0.25 mg) by mouth daily. 45 tablet 3   gabapentin (NEURONTIN) 300 MG capsule Take 1 capsule (300 mg total) by mouth at bedtime. 14 capsule 0   levothyroxine (SYNTHROID) 50 MCG tablet Take 1 tablet (50 mcg total) by mouth daily at 6 (six) AM. 14 tablet 0   MAGNESIUM PO Take by mouth.     Multiple Vitamin (MULTIVITAMIN) capsule Take 1 capsule by mouth daily.     polyethylene glycol powder (GLYCOLAX/MIRALAX) 17 GM/SCOOP powder Take 1 Container by mouth  once.     progesterone (PROMETRIUM) 100 MG capsule Take 1 capsule (100 mg total) by mouth at bedtime. 90 capsule 3   SUFLAVE 178.7 g SOLR Take by mouth.     No current facility-administered medications on file prior to visit.    Allergies  Allergen Reactions   Aripiprazole Other (See Comments)    Other reaction(s): dyskinesia   Oxycodone Hcl Other (See Comments)   Cetirizine Rash   Codeine Nausea And Vomiting      PE Today's Vitals   05/30/23 1412  BP: 104/60  Pulse: (!) 58  Temp: (!) 97.5 F (36.4 C)  TempSrc: Oral  SpO2: 98%  Weight: 129 lb (58.5 kg)   Body mass index is 22.49 kg/m.  Physical Exam Vitals reviewed.  Constitutional:      General: She is not in acute distress.    Appearance: Normal appearance.  HENT:     Head: Normocephalic and atraumatic.     Nose: Nose normal.  Eyes:  Extraocular Movements: Extraocular movements intact.     Conjunctiva/sclera: Conjunctivae normal.  Pulmonary:     Effort: Pulmonary effort is normal.  Chest:     Comments: Faint erythema of left mammary fold. Musculoskeletal:        General: Normal range of motion.     Cervical back: Normal range of motion.  Neurological:     General: No focal deficit present.     Mental Status: She is alert.  Psychiatric:        Mood and Affect: Mood normal.        Behavior: Behavior normal.    Assessment and Plan:        Candidal intertrigo -     Clotrimazole-Betamethasone; Apply 1 Application topically 2 (two) times daily as needed for up to 14 days. Apply thin layer to affected area.  Dispense: 45 g; Refill: 0    Rosalyn Gess, MD

## 2023-05-31 ENCOUNTER — Encounter: Payer: Self-pay | Admitting: Gastroenterology

## 2023-05-31 ENCOUNTER — Ambulatory Visit (AMBULATORY_SURGERY_CENTER): Payer: Self-pay | Admitting: Gastroenterology

## 2023-05-31 VITALS — BP 119/69 | HR 60 | Temp 97.2°F | Resp 14 | Ht 63.5 in | Wt 135.0 lb

## 2023-05-31 DIAGNOSIS — D120 Benign neoplasm of cecum: Secondary | ICD-10-CM | POA: Diagnosis not present

## 2023-05-31 DIAGNOSIS — Q439 Congenital malformation of intestine, unspecified: Secondary | ICD-10-CM

## 2023-05-31 DIAGNOSIS — D122 Benign neoplasm of ascending colon: Secondary | ICD-10-CM

## 2023-05-31 DIAGNOSIS — Z1211 Encounter for screening for malignant neoplasm of colon: Secondary | ICD-10-CM | POA: Diagnosis not present

## 2023-05-31 MED ORDER — SODIUM CHLORIDE 0.9 % IV SOLN: 500.0000 mL | INTRAVENOUS | Status: DC

## 2023-05-31 NOTE — Progress Notes (Signed)
 Report given to PACU, vss

## 2023-05-31 NOTE — Progress Notes (Signed)
 Called to room to assist during endoscopic procedure.  Patient ID and intended procedure confirmed with present staff. Received instructions for my participation in the procedure from the performing physician.

## 2023-05-31 NOTE — Progress Notes (Signed)
 Pt's states no medical or surgical changes since previsit or office visit.

## 2023-05-31 NOTE — Op Note (Signed)
 [facility] Patient Name: Kara Webster Procedure Date: 05/31/2023 11:45 AM MRN: [id number] Endoscopist: Viviann Spare P. Adela Lank , MD, [id number] Age: 68 Referring MD:  Date of Birth: 1956-01-07 Gender: Female Account #: [id number] Procedure:                Colonoscopy Indications:              Screening for colorectal malignant neoplasm Medicines:                Monitored Anesthesia Care Procedure:                Pre-Anesthesia Assessment:                           - Prior to the procedure, a History and Physical                            was performed, and patient medications and                            allergies were reviewed. The patient's tolerance of                            previous anesthesia was also reviewed. The risks                            and benefits of the procedure and the sedation                            options and risks were discussed with the patient.                            All questions were answered, and informed consent                            was obtained. Prior Anticoagulants: The patient has                            taken no anticoagulant or antiplatelet agents. ASA                            Grade Assessment: II - A patient with mild systemic                            disease. After reviewing the risks and benefits,                            the patient was deemed in satisfactory condition to                            undergo the procedure.                           After obtaining informed consent, the colonoscope  was passed under direct vision. Throughout the                            procedure, the patient's blood pressure, pulse, and                            oxygen saturations were monitored continuously. The                            [id number]-HQ190L Colonoscope [id number] was introduced                            through the anus and advanced to the the cecum,                            identified by  appendiceal orifice and ileocecal                            valve. The colonoscopy was performed without                            difficulty. The patient tolerated the procedure                            well. The quality of the bowel preparation was                            good. The ileocecal valve, appendiceal orifice, and                            rectum were photographed. Scope In: 11:51:00 AM Scope Out: 12:16:18 PM Scope Withdrawal Time: 0 hours 18 minutes 13 seconds  Total Procedure Duration: 0 hours 25 minutes 18 seconds  Findings:                 The perianal and digital rectal examinations were                            normal.                           A 4 mm polyp was found in the cecum. The polyp was                            sessile. The polyp was removed with a cold snare.                            Resection and retrieval were complete.                           A diminutive polyp was found in the ascending                            colon. The polyp was sessile. The polyp was removed  with a cold snare. Resection and retrieval were                            complete.                           The colon was long and extremely tortuous.                            Pediatric colonoscope used for this exam.                           The exam was otherwise without abnormality. Complications:            No immediate complications. Estimated blood loss:                            Minimal. Estimated Blood Loss:     Estimated blood loss was minimal. Impression:               - One 4 mm polyp in the cecum, removed with a cold                            snare. Resected and retrieved.                           - One diminutive polyp in the ascending colon,                            removed with a cold snare. Resected and retrieved.                           - Tortuous colon.                           - The examination was otherwise  normal. Recommendation:           - Patient has a contact number available for                            emergencies. The signs and symptoms of potential                            delayed complications were discussed with the                            patient. Return to normal activities tomorrow.                            Written discharge instructions were provided to the                            patient.                           - Resume previous diet.                           -  Continue present medications.                           - Await pathology results. Viviann Spare P. Ketura Sirek, MD 05/31/2023 12:20:02 PM This report has been signed electronically.

## 2023-05-31 NOTE — Patient Instructions (Signed)

## 2023-05-31 NOTE — Progress Notes (Signed)
  Gastroenterology History and Physical   Primary Care Physician:  Patient, [name]   Reason for Procedure:   Colon cancer screening  Plan:    colonoscopy     HPI: Kara Webster is a 68 y.o. female  here for colonoscopy screening - last exam I can see on file was 2009 - poor prep, [facility].   Patient denies any bowel symptoms at this time. No family history of colon cancer known. Otherwise feels well without any cardiopulmonary symptoms.   I have discussed risks / benefits of anesthesia and endoscopic procedure with Sheral Apley and they wish to proceed with the exams as outlined today.    Past Medical History:  Diagnosis Date   Bipolar 1 disorder (HCC)    Constipation    See's MD for issue and it is resolving per pt OK with issue for  a month now   Depression    Hearing loss    Hypothyroid    Rectocele    RLS (restless legs syndrome)    Squamous cell carcinoma of skin 10/18/2020   Mid Tip of Nose (in situ)    Past Surgical History:  Procedure Laterality Date   BLADDER SURGERY  1980   x2   COLONOSCOPY     TUBAL LIGATION      Prior to Admission medications   Medication Sig Start Date End Date Taking? Authorizing Provider  cholecalciferol (VITAMIN D3) 25 MCG (1000 UNIT) tablet Take 1,000 Units by mouth daily.   Yes [provider]  Cyanocobalamin (CVS B12 GUMMIES PO) Take by mouth.   Yes [provider]  divalproex (DEPAKOTE) 500 MG DR tablet Take 1 tablet (500 mg total) by mouth 2 (two) times daily. 05/06/23  Yes Eligha Bridegroom, NP  DULoxetine 40 MG CPEP Take 1 capsule (40 mg total) by mouth daily. 05/07/23  Yes Eligha Bridegroom, NP  estradiol (ESTRACE) 0.5 MG tablet Take 1/2 tablet (0.25 mg) by mouth daily. 02/05/23  Yes Patton Salles, MD  gabapentin (NEURONTIN) 300 MG capsule Take 1 capsule (300 mg total) by mouth at bedtime. 05/06/23  Yes Eligha Bridegroom, NP  levothyroxine (SYNTHROID) 50 MCG tablet Take 1 tablet (50 mcg  total) by mouth daily at 6 (six) AM. 05/07/23  Yes Eligha Bridegroom, NP  MAGNESIUM PO Take by mouth.   Yes [provider]  Multiple Vitamin (MULTIVITAMIN) capsule Take 1 capsule by mouth daily.   Yes [provider]  polyethylene glycol powder (GLYCOLAX/MIRALAX) 17 GM/SCOOP powder Take 1 Container by mouth once.   Yes [provider]  progesterone (PROMETRIUM) 100 MG capsule Take 1 capsule (100 mg total) by mouth at bedtime. 02/05/23  Yes Patton Salles, MD  CALCIUM PO Take by mouth.    [provider]  clotrimazole-betamethasone (LOTRISONE) cream Apply 1 Application topically 2 (two) times daily as needed for up to 14 days. Apply thin layer to affected area. 05/30/23 06/13/23  Rosalyn Gess, MD    Current Outpatient Medications  Medication Sig Dispense Refill   cholecalciferol (VITAMIN D3) 25 MCG (1000 UNIT) tablet Take 1,000 Units by mouth daily.     Cyanocobalamin (CVS B12 GUMMIES PO) Take by mouth.     divalproex (DEPAKOTE) 500 MG DR tablet Take 1 tablet (500 mg total) by mouth 2 (two) times daily. 28 tablet 0   DULoxetine 40 MG CPEP Take 1 capsule (40 mg total) by mouth daily. 14 capsule 0   estradiol (ESTRACE)  0.5 MG tablet Take 1/2 tablet (0.25 mg) by mouth daily. 45 tablet 3   gabapentin (NEURONTIN) 300 MG capsule Take 1 capsule (300 mg total) by mouth at bedtime. 14 capsule 0   levothyroxine (SYNTHROID) 50 MCG tablet Take 1 tablet (50 mcg total) by mouth daily at 6 (six) AM. 14 tablet 0   MAGNESIUM PO Take by mouth.     Multiple Vitamin (MULTIVITAMIN) capsule Take 1 capsule by mouth daily.     polyethylene glycol powder (GLYCOLAX/MIRALAX) 17 GM/SCOOP powder Take 1 Container by mouth once.     progesterone (PROMETRIUM) 100 MG capsule Take 1 capsule (100 mg total) by mouth at bedtime. 90 capsule 3   CALCIUM PO Take by mouth.     clotrimazole-betamethasone (LOTRISONE) cream Apply 1 Application topically 2 (two) times daily as needed for up  to 14 days. Apply thin layer to affected area. 45 g 0   Current Facility-Administered Medications  Medication Dose Route Frequency Provider Last Rate Last Admin   0.9 %  sodium chloride infusion  500 mL Intravenous Continuous Dechelle Attaway, Willaim Rayas, MD        Allergies as of 05/31/2023 - Review Complete 05/31/2023  Allergen Reaction Noted   Aripiprazole Other (See Comments) 02/09/2021   Oxycodone hcl Other (See Comments) 04/24/2023   Cetirizine Rash 07/15/2019   Codeine Nausea And Vomiting 05/27/2013    Family History  Problem Relation Age of Onset   Cancer Mother        leukemia    Hypertension Mother    Cancer Father        lung    Alcohol abuse Father    Diabetes Sister    Hyperlipidemia Sister    Hypertension Sister    Depression Brother    Diabetes Brother    Hypertension Brother    Alcohol abuse Brother    Alcohol abuse Maternal Uncle    Alcohol abuse Paternal Aunt    Alcohol abuse Maternal Grandfather    Bladder Cancer Neg Hx    Uterine cancer Neg Hx    Colon cancer Neg Hx    Colon polyps Neg Hx    Esophageal cancer Neg Hx    Rectal cancer Neg Hx    Stomach cancer Neg Hx     Social History   Socioeconomic History   Marital status: Divorced    Spouse name: Not on file   Number of children: Not on file   Years of education: Not on file   Highest education level: Not on file  Occupational History   Not on file  Tobacco Use   Smoking status: Former    Current packs/day: 0.00    Types: Cigarettes    Quit date: 06/01/1996    Years since quitting: 27.0   Smokeless tobacco: Never  Vaping Use   Vaping status: Never Used  Substance and Sexual Activity   Alcohol use: Not Currently   Drug use: Not Currently    Types: Marijuana, Cocaine, Amphetamines, Heroin   Sexual activity: Not Currently    Partners: Male    Birth control/protection: Post-menopausal    Comment: 1st intercourse- 17, partners- greater than 5, no STD, no abnormal pap, no DES exposure  Other  Topics Concern   Not on file  Social History Narrative   Not on file   Social Drivers of Health   Financial Resource Strain: Not on file  Food Insecurity: Not on file  Transportation Needs: Not on file  Physical Activity: Not on file  Stress: Not on file  Social Connections: Not on file  Intimate Partner Violence: Not on file    Review of Systems: All other review of systems negative except as mentioned in the HPI.  Physical Exam: Vital signs BP 117/73   Pulse 76   Temp (!) 97.2 F (36.2 C) (Temporal)   Ht 5' 3.5" (1.613 m)   Wt 135 lb (61.2 kg)   SpO2 100%   BMI 23.54 kg/m   General:   Alert,  Well-developed, pleasant and cooperative in NAD Lungs:  Clear throughout to auscultation.   Heart:  Regular rate and rhythm Abdomen:  Soft, nontender and nondistended.   Neuro/Psych:  Alert and cooperative. Normal mood and affect. A and O x 3  Harlin Rain, MD [facility]

## 2023-06-01 ENCOUNTER — Telehealth: Payer: Self-pay

## 2023-06-01 NOTE — Telephone Encounter (Signed)
  Follow up Call-     05/31/2023   10:56 AM  Call back number  Post procedure Call Back phone  # [phone]  Permission to leave phone message Yes     Patient questions:  Do you have a fever, pain , or abdominal swelling? No. Pain Score  0 *  Have you tolerated food without any problems? Yes.    Have you been able to return to your normal activities? Yes.    Do you have any questions about your discharge instructions: Diet   No. Medications  No. Follow up visit  No.  Do you have questions or concerns about your Care? No.  Actions: * If pain score is 4 or above: No action needed, pain <4.

## 2023-06-02 DIAGNOSIS — D122 Benign neoplasm of ascending colon: Secondary | ICD-10-CM | POA: Diagnosis not present

## 2023-06-02 DIAGNOSIS — D120 Benign neoplasm of cecum: Secondary | ICD-10-CM | POA: Diagnosis not present

## 2023-06-04 ENCOUNTER — Encounter: Payer: Self-pay | Admitting: Gastroenterology

## 2023-06-04 LAB — SURGICAL PATHOLOGY

## 2023-06-08 ENCOUNTER — Ambulatory Visit (HOSPITAL_COMMUNITY)
Admission: EM | Admit: 2023-06-08 | Discharge: 2023-06-08 | Disposition: A | Discharge status: Home / Self Care | Attending: Nurse Practitioner | Admitting: Nurse Practitioner

## 2023-06-08 DIAGNOSIS — F319 Bipolar disorder, unspecified: Secondary | ICD-10-CM | POA: Diagnosis not present

## 2023-06-08 DIAGNOSIS — Z5189 Encounter for other specified aftercare: Secondary | ICD-10-CM | POA: Diagnosis not present

## 2023-06-08 NOTE — Progress Notes (Addendum)
   06/08/23 1630  [facility] Triage Screening (Walk-ins at [facility] only)  How Did You Hear About [place]? Self  What Is the Reason for Your Visit/Call Today? Pt presents to [facility] voluntarily unaccompanied. Pt states she has not been able to find a therapist (in person), specifically for Bipolar. Pt states she is having a lot of anxiety trying to find someone to talk to. Pt denies SI< HI, AVH, Abuse, Alcohol/Drug Use.  How Long Has This Been Causing You Problems? 1 wk - 1 month  Have You Recently Had Any Thoughts About Hurting Yourself? No  Are You Planning to Commit Suicide/Harm Yourself At This time? No  Have you Recently Had Thoughts About Hurting Someone Karolee Ohs? No  Are You Planning To Harm Someone At This Time? No  Physical Abuse Denies  Verbal Abuse Denies  Sexual Abuse Denies  Exploitation of patient/patient's resources Denies  Self-Neglect Denies  Are you currently experiencing any auditory, visual or other hallucinations? No  Have You Used Any Alcohol or Drugs in the Past 24 Hours? No  Do you have any current medical co-morbidities that require immediate attention? No  Clinician description of patient physical appearance/behavior: Pt is calm and cooperative, guarded  What Do You Feel Would Help You the Most Today? Treatment for Depression or other mood problem  If access to [facility] was not available, would you have sought care in the Emergency Department? No  Determination of Need Routine (7 days)  Options For Referral Outpatient Therapy

## 2023-06-08 NOTE — Discharge Instructions (Addendum)
 Please call from the list of resources attached to get a therapist. If you start experiencing worsening of symptoms, having suicidal thoughts, homicidal thoughts, or perceptual disturbances, call [phone], go to the nearest ER, or come back to this center.

## 2023-06-08 NOTE — Discharge Summary (Signed)
 Kara Webster to be D/C'd Home per NP order. An After Visit Summary was printed and given to the patient by provider. Patient escorted out and D/C home via private auto.  Dickie La  06/08/2023 5:35 PM

## 2023-06-08 NOTE — ED Provider Notes (Signed)
 Behavioral Health Urgent Care Medical Screening Exam  Patient Name: Kara Webster MRN: [id number] Date of Evaluation: 06/08/23 Chief Complaint:  Needing a therapist Diagnosis:  Final diagnoses:  Therapy   History of Present illness: Kara Webster is a 68 y.o. female with a prior mental health history of bipolar disorder who presents to the [facility] urgent care requesting to see a therapist.  Assessment: Patient reports during encounter that when she presented to the ED on 05/03/2023, there was an initial decision for her to be transported to [facility], but she changed her mind, decided to find a therapist in the community, for management of her bipolar disorder.  Patient reports that she is just wanting somebody that she can speak with periodically, and is not interested in a virtual therapist, and would like someone in person.   Patient reports that she has a mental health provider currently, states that name is Corie Chiquito, and reports that her current provider has prescribed her Depakote 250 mg twice daily, Lamictal which she states she does not know the dosage, and duloxetine which she also does not know the dosage, but states that she has been taking for a long time, with the exception of Depakote which she has only been on for a few months.  She reports that the medications are helping to stabilize her mood, but she is required as therapist, and has not been able to find 1 in the community by herself.  She denies SI, denies HI, denies AVH. Denies paranoia, denies delusional thinking. Denies first rank symptoms.  Patient denies depressive symptoms, states that she is sleeping well, does not have any trouble enjoying things that make her happy, denies any trouble with her concentration, appetite, or overall mood currently.  Denies excessive worrying, or feelings of sadness, denies mania or hypomania related to a diagnosis of bipolar disorder.  Reports that her  symptoms are currently stable.  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 She has also been provided with a list of other therapists in the area to call, and verbalizes understanding.  Patient verbalizes no other concerns, states that safety is not a concern at this time.  She is educated that in the event that she begins to have suicidal ideations, homicidal ideations, or perceptual disturbances, she is to call 911, 22, go to the nearest ER, or come back to this urgent care center.  Patient verbalizes understanding, left this location in no distress.   Flowsheet Row ED from 06/08/2023 in [facility] ED from 05/03/2023 in [facility] Emergency Department at [facility] ED from 12/24/2020 in [facility]  C-SSRS RISK CATEGORY No Risk No Risk High Risk       Psychiatric Specialty Exam  Presentation  General Appearance:Appropriate for Environment  Eye Contact:Good  Speech:Clear and Coherent  Speech Volume:Normal  Handedness:Right   Mood and Affect  Mood:Euthymic  Affect:Congruent   Thought Process  Thought Processes:Coherent  Descriptions of Associations:Intact  Orientation:Full (Time, Place and Person)  Thought Content:WDL    Hallucinations:None  Ideas of Reference:None  Suicidal Thoughts:No  Homicidal Thoughts:No   Sensorium  Memory:Immediate Good  Judgment:Good  Insight:Good   Executive Functions  Concentration:Good  Attention Span:Good  Recall:Good  Fund of Knowledge:Good  Language:Good   Psychomotor Activity  Psychomotor Activity:Normal   Assets  Assets:Communication Skills   Sleep  Sleep:Good  Number of hours: No data recorded  Physical Exam: Physical Exam Vitals and nursing note reviewed.  Neurological:     General: No focal deficit  present.     Mental Status: She is oriented to person, place, and time.  Psychiatric:        Mood and Affect: Mood normal.        Behavior: Behavior normal.        Thought Content: Thought content normal.        Judgment: Judgment normal.    Review of Systems  Psychiatric/Behavioral:  Negative for depression, hallucinations, memory loss, substance abuse and suicidal ideas. The patient is not nervous/anxious and does not have insomnia.   All other systems reviewed and are negative.  Blood pressure 118/72, pulse (!) 57, temperature (!) 97.3 F (36.3 C), temperature source Oral, resp. rate 16, SpO2 100%. There is no height or weight on file to calculate BMI.  Musculoskeletal: Strength & Muscle Tone: within normal limits Gait & Station: normal Patient leans: N/A   [facility] MSE Discharge Disposition for Follow up and Recommendations: Based on my evaluation the patient does not appear to have an emergency medical condition and can be discharged with resources and follow up care in outpatient services for Individual Therapy  -Provided with resources for therapy, states will call her insurance to also check if she can have therapy at this location on the second floor. Starleen Blue, NP 06/08/2023, 5:51 PM

## 2023-06-14 DIAGNOSIS — F310 Bipolar disorder, current episode hypomanic: Secondary | ICD-10-CM | POA: Diagnosis not present

## 2023-06-18 DIAGNOSIS — H40013 Open angle with borderline findings, low risk, bilateral: Secondary | ICD-10-CM | POA: Diagnosis not present

## 2023-06-18 DIAGNOSIS — H524 Presbyopia: Secondary | ICD-10-CM | POA: Diagnosis not present

## 2023-06-28 DIAGNOSIS — F319 Bipolar disorder, unspecified: Secondary | ICD-10-CM | POA: Diagnosis not present

## 2023-06-28 DIAGNOSIS — F411 Generalized anxiety disorder: Secondary | ICD-10-CM | POA: Diagnosis not present

## 2023-07-02 ENCOUNTER — Telehealth: Payer: Self-pay | Admitting: Gastroenterology

## 2023-07-02 DIAGNOSIS — H903 Sensorineural hearing loss, bilateral: Secondary | ICD-10-CM | POA: Diagnosis not present

## 2023-07-02 DIAGNOSIS — F310 Bipolar disorder, current episode hypomanic: Secondary | ICD-10-CM | POA: Diagnosis not present

## 2023-07-02 NOTE — Telephone Encounter (Signed)
 Patient states that over the last month, she has experienced constant epigastric burning. Has not really found correlation between specific foods and symptoms. Denies any  nausea or vomiting. No regurgitation. Patient states that she has tried lactaid and Nexium 20 mg daily over the counter. States the Nexium "calms it down just a little bit" but otherwise, continues with symptoms. Patient has been scheduled for office visit with Everett Hitt, NP on 07/17/23. In the meantime, I have advised patient that she may take up to Nexium 40 mg twice daily. She should avoid spicy, fatty, acidic food. Avoid caffeine, alcohol etc. Discussed eating small, frequent meals and avoidance of laying down within 3-4 hours of eating. Patient verbalizes understanding/

## 2023-07-02 NOTE — Telephone Encounter (Signed)
 Requesting t speak with a nurse to discuss abdominal burning. States has been going on since march. Has only taken over the counter meds. Please advise.   Thank you

## 2023-07-17 ENCOUNTER — Ambulatory Visit: Admitting: Nurse Practitioner

## 2023-07-17 ENCOUNTER — Other Ambulatory Visit (INDEPENDENT_AMBULATORY_CARE_PROVIDER_SITE_OTHER)

## 2023-07-17 ENCOUNTER — Encounter: Payer: Self-pay | Admitting: Nurse Practitioner

## 2023-07-17 VITALS — BP 100/60 | HR 68 | Ht 63.0 in | Wt 132.0 lb

## 2023-07-17 DIAGNOSIS — R1013 Epigastric pain: Secondary | ICD-10-CM | POA: Diagnosis not present

## 2023-07-17 DIAGNOSIS — Z860101 Personal history of adenomatous and serrated colon polyps: Secondary | ICD-10-CM

## 2023-07-17 DIAGNOSIS — R0989 Other specified symptoms and signs involving the circulatory and respiratory systems: Secondary | ICD-10-CM | POA: Diagnosis not present

## 2023-07-17 LAB — CBC WITH DIFFERENTIAL/PLATELET
Basophils Absolute: 0.1 10*3/uL (ref 0.0–0.1)
Basophils Relative: 0.7 % (ref 0.0–3.0)
Eosinophils Absolute: 0.1 10*3/uL (ref 0.0–0.7)
Eosinophils Relative: 1.2 % (ref 0.0–5.0)
HCT: 41.1 % (ref 36.0–46.0)
Hemoglobin: 13.5 g/dL (ref 12.0–15.0)
Lymphocytes Relative: 32.5 % (ref 12.0–46.0)
Lymphs Abs: 2.4 10*3/uL (ref 0.7–4.0)
MCHC: 32.7 g/dL (ref 30.0–36.0)
MCV: 92 fl (ref 78.0–100.0)
Monocytes Absolute: 0.6 10*3/uL (ref 0.1–1.0)
Monocytes Relative: 8.1 % (ref 3.0–12.0)
Neutro Abs: 4.2 10*3/uL (ref 1.4–7.7)
Neutrophils Relative %: 57.5 % (ref 43.0–77.0)
Platelets: 290 10*3/uL (ref 150.0–400.0)
RBC: 4.47 Mil/uL (ref 3.87–5.11)
RDW: 14.5 % (ref 11.5–15.5)
WBC: 7.4 10*3/uL (ref 4.0–10.5)

## 2023-07-17 LAB — COMPREHENSIVE METABOLIC PANEL WITH GFR
ALT: 19 U/L (ref 0–35)
AST: 29 U/L (ref 0–37)
Albumin: 3.9 g/dL (ref 3.5–5.2)
Alkaline Phosphatase: 44 U/L (ref 39–117)
BUN: 24 mg/dL — ABNORMAL HIGH (ref 6–23)
CO2: 32 meq/L (ref 19–32)
Calcium: 9.9 mg/dL (ref 8.4–10.5)
Chloride: 104 meq/L (ref 96–112)
Creatinine, Ser: 0.83 mg/dL (ref 0.40–1.20)
GFR: 73.9 mL/min (ref >60.00)
Glucose, Bld: 94 mg/dL (ref 70–99)
Potassium: 4.6 meq/L (ref 3.5–5.1)
Sodium: 140 meq/L (ref 135–145)
Total Bilirubin: 0.4 mg/dL (ref 0.2–1.2)
Total Protein: 6.3 g/dL (ref 6.0–8.3)

## 2023-07-17 MED ORDER — ESOMEPRAZOLE MAGNESIUM 40 MG PO CPDR: 40.0000 mg | DELAYED_RELEASE_CAPSULE | Freq: Two times a day (BID) | ORAL | 1 refills | Status: DC

## 2023-07-17 NOTE — Progress Notes (Addendum)
 07/17/2023 Kara Webster [id number] 29-May-1955   Chief Complaint: Epigastric burning  History of Present Illness: Kara Webster is a 68 year old female with a past medical history of depression, bipolar, hypothyroidism, restless leg syndrome, squamous cell skin cancer, restless leg syndrome and colon polyps. She presents today for further evaluation regarding epigastric burning which she stated started a few hours after completing a colonoscopy 05/31/2023 by Dr. General Kenner which identified 2 small tubular adenomatous polyps removed from the ascending and cecum and the colon was tortuous.  No nausea or vomiting. She took Nexium  20 mg OTC with minimal improvement. She contacted our office on 07/02/2023 and was instructed to take Nexium  40 mg twice daily and to maintain a GERD diet. She presents today for further follow-up. She stated shortly after completing the colonoscopy she felt pain to the epigastric area which has persisted since then and is worse in the morning and at night.  No obvious heartburn.  She avoids spicy foods and is eating more bread which is comforting and is gained a few pounds.  She also noted her lips and tongue were dry and she saw her dentist yesterday who prescribed an oral rinse.  She stopped taking Nexium  4 days ago.  She infrequently takes Aleve for aches and pains.  She previously drink 2 cups of coffee every morning but stopped a few weeks ago.  No alcohol use.  She has intermittent constipation but for the past 5 days she has passed a soft bowel movement daily but noted for the past 2 to 3 days her stools are black in color.  She took a few doses of Pepto-Bismol over the past week but not for the past 2 days.  No bright red rectal bleeding.  No history of stomach ulcers or GI bleed.     Latest Ref Rng & Units 05/03/2023    1:45 PM 05/09/2021    8:45 AM 11/11/2019   11:58 AM  CBC  WBC 4.0 - 10.5 K/uL 5.7  6.0  5.7   Hemoglobin 12.0 - 15.0 g/dL 40.9  81.1  91.4    Hematocrit 36.0 - 46.0 % 38.8  42.4  42.4   Platelets 150 - 400 K/uL 331  354  326        Latest Ref Rng & Units 05/03/2023    1:45 PM 05/29/2022   11:57 AM 05/09/2021    8:45 AM  CMP  Glucose 70 - 99 mg/dL 85   88   BUN 8 - 23 mg/dL 13   11   Creatinine 7.82 - 1.00 mg/dL 9.56  2.13  0.86   Sodium 135 - 145 mmol/L 141   145   Potassium 3.5 - 5.1 mmol/L 4.5   4.9   Chloride 98 - 111 mmol/L 106   104   CO2 22 - 32 mmol/L 27   35   Calcium 8.9 - 10.3 mg/dL 9.5   57.8   Total Protein 6.5 - 8.1 g/dL 6.3   6.9   Total Bilirubin 0.0 - 1.2 mg/dL 0.5   0.5   Alkaline Phos 38 - 126 U/L 48     AST 15 - 41 U/L 24   16   ALT 0 - 44 U/L 10   7     Colonoscopy 05/31/2023 by Dr. General Kenner: - One 4 mm polyp in the cecum, removed with a cold snare. Resected and retrieved.  - One diminutive polyp in the ascending colon, removed with a cold  snare. Resected and retrieved.  - Tortuous colon.  - The examination was otherwise normal - 7 year recall colonoscopy  1. Surgical [P], colon, ascending and cecum, polyp (2) :       FRAGMENTS OF TUBULAR ADENOMA.       NEGATIVE FOR HIGH-GRADE DYSPLASIA.   Past Medical History:  Diagnosis Date   Bipolar 1 disorder (HCC)    Constipation    See's MD for issue and it is resolving per pt OK with issue for  a month now   Depression    Hearing loss    Hypothyroid    Rectocele    RLS (restless legs syndrome)    Squamous cell carcinoma of skin 10/18/2020   Mid Tip of Nose (in situ)   Past Surgical History:  Procedure Laterality Date   BLADDER SURGERY  1980   x2   COLONOSCOPY     TUBAL LIGATION     Current Outpatient Medications on File Prior to Visit  Medication Sig Dispense Refill   CALCIUM PO Take by mouth.     cholecalciferol (VITAMIN D3) 25 MCG (1000 UNIT) tablet Take 1,000 Units by mouth daily.     Cyanocobalamin  ([employer] B12 GUMMIES PO) Take by mouth.     divalproex  (DEPAKOTE  ER) 500 MG 24 hr tablet Take 500 mg by mouth 2 (two) times daily.      DULoxetine  (CYMBALTA ) 20 MG capsule Take 40 mg by mouth daily.     estradiol  (ESTRACE ) 0.5 MG tablet Take 1/2 tablet (0.25 mg) by mouth daily. 45 tablet 3   gabapentin  (NEURONTIN ) 300 MG capsule Take 1 capsule (300 mg total) by mouth at bedtime. 14 capsule 0   lamoTRIgine  (LAMICTAL ) 200 MG tablet Take 200 mg by mouth daily.     levothyroxine  (SYNTHROID ) 50 MCG tablet Take 1 tablet (50 mcg total) by mouth daily at 6 (six) AM. 14 tablet 0   MAGNESIUM  PO Take by mouth.     Multiple Vitamin (MULTIVITAMIN) capsule Take 1 capsule by mouth daily.     polyethylene glycol powder (GLYCOLAX/MIRALAX) 17 GM/SCOOP powder Take 1 Container by mouth once.     progesterone  (PROMETRIUM ) 100 MG capsule Take 1 capsule (100 mg total) by mouth at bedtime. 90 capsule 3   No current facility-administered medications on file prior to visit.   Allergies  Allergen Reactions   Aripiprazole Other (See Comments)    Other reaction(s): dyskinesia   Oxycodone Hcl Other (See Comments)   Cetirizine Rash   Codeine Nausea And Vomiting   Current Medications, Allergies, Past Medical History, Past Surgical History, Family History and Social History were reviewed in [employer] record.  Review of Systems:   Constitutional: Negative for fever, sweats, chills or weight loss.  Respiratory: Negative for shortness of breath.   Cardiovascular: Negative for chest pain, palpitations and leg swelling.  Gastrointestinal: See HPI.  Musculoskeletal: Negative for back pain or muscle aches.  Neurological: Negative for dizziness, headaches or paresthesias.   Physical Exam: Ht 5\' 3"  (1.6 m) Comment: height measured without shoes  Wt 132 lb (59.9 kg)   BMI 23.38 kg/m  General: 68 year old female in no acute distress. Head: Normocephalic and atraumatic. Eyes: No scleral icterus. Conjunctiva pink . Ears: Normal auditory acuity. Mouth: Dentition intact. No ulcers or lesions.  Lungs: Clear throughout to  auscultation. Heart: Regular rate and rhythm, no murmur. Abdomen: Soft, nondistended.  Epigastric tenderness without rebound or guarding.  Bruit to the epigastric area (patient ate lunch approximately 1  hour prior to exam). No masses or hepatomegaly. Normal bowel sounds x 4 quadrants.  Rectal: Deferred.  Musculoskeletal: Symmetrical with no gross deformities. Extremities: No edema. Neurological: Alert oriented x 4. No focal deficits.  Psychological: Alert and cooperative. Normal mood and affect  Assessment and Recommendations:  68 year old female with burning epigastric pain which started within hours after completing a screening colonoscopy 05/31/2023.  Epigastric burning pain decreased after initiating Nexium  20 mg daily then dose was increased to 40 mg twice daily.  She stopped taking Nexium  4 days ago.  She endorses passing black stools for the past few days in setting of taking Pepto-Bismol a few doses within the past week. -CBC and CMP - Patient instructed not to take any further Pepto-Bismol - Continue Nexium  40 mg twice daily - Gaviscon 1 tablespoon p.o. 3 times daily - Stool heme cards x 3 - EGD benefits and risks discussed including risk with sedation, risk of bleeding, perforation and infection.  Patient did not wish to pursue an EGD at this time.  To rediscuss scheduling EGD if her labs show anemia or if her epigastric pain persists/worsens - She will contact our office if her symptoms worsen  Central upper abdominal bruit auscultated on exam today, patient endorsed eating lunch 1 hour prior to exam. - Patient to follow-up in 2 to 3 weeks fasting to repeat abdominal exam, if abdominal bruit auscultated at that time we will schedule abdominal CTA  Two small tubular adenomatous polyps identified per colonoscopy 05/2023. - Next colon polyp surveillance colonoscopy due 05/2030  History of depression/bipolar disorder  ADDENDUM: Lab results received, hemoglobin stable at 13.5,  hematocrit 41.1 with BUN mildly elevated at 24 with a baseline BUN 13 two months ago.

## 2023-07-17 NOTE — Progress Notes (Signed)
 Agree with assessment and plan is outlined. Really sorry to hear this happened. Post colonoscopy, but rather unusual symptoms to be related to her procedure. I agree with checking her labs and if this persists this my medical therapy then would recommend upper endoscopy.

## 2023-07-17 NOTE — Patient Instructions (Signed)
 Your provider has requested that you go to the basement level for lab work before leaving today. Press "B" on the elevator. The lab is located at the first door on the left as you exit the elevator.  We have sent the following medications to your pharmacy for you to pick up at your convenience:  Esomeprazole.  Take 1 tablespoon of Gaviscon OTC thee times a day.  Follow the instructions on the Hemoccult cards and mail them back to us  when you are finished or you may take them directly to the lab in the basement of the [place] building. We will call you with the results.   Discontinue Pepto Bismol  _______________________________________________________  If your blood pressure at your visit was 140/90 or greater, please contact your primary care physician to follow up on this.  _______________________________________________________  If you are age 50 or older, your body mass index should be between 23-30. Your Body mass index is 23.38 kg/m. If this is out of the aforementioned range listed, please consider follow up with your Primary Care Provider.  If you are age 45 or younger, your body mass index should be between 19-25. Your Body mass index is 23.38 kg/m. If this is out of the aformentioned range listed, please consider follow up with your Primary Care Provider.   ________________________________________________________  The [facility] providers would like to encourage you to use MYCHART to communicate with providers for non-urgent requests or questions.  Due to long hold times on the telephone, sending your provider a message by [facility] may be a faster and more efficient way to get a response.  Please allow 48 business hours for a response.  Please remember that this is for non-urgent requests.  _______________________________________________________

## 2023-07-30 ENCOUNTER — Ambulatory Visit (INDEPENDENT_AMBULATORY_CARE_PROVIDER_SITE_OTHER)

## 2023-07-30 DIAGNOSIS — R1013 Epigastric pain: Secondary | ICD-10-CM

## 2023-07-31 LAB — HEMOCCULT SLIDES (X 3 CARDS)
Fecal Occult Blood: NEGATIVE
OCCULT 1: NEGATIVE
OCCULT 2: NEGATIVE
OCCULT 3: NEGATIVE
OCCULT 4: NEGATIVE
OCCULT 5: NEGATIVE

## 2023-08-01 ENCOUNTER — Ambulatory Visit: Payer: Self-pay | Admitting: Nurse Practitioner

## 2023-08-02 DIAGNOSIS — F319 Bipolar disorder, unspecified: Secondary | ICD-10-CM | POA: Diagnosis not present

## 2023-08-02 DIAGNOSIS — F411 Generalized anxiety disorder: Secondary | ICD-10-CM | POA: Diagnosis not present

## 2023-08-22 ENCOUNTER — Other Ambulatory Visit (HOSPITAL_COMMUNITY): Payer: Self-pay

## 2023-08-23 ENCOUNTER — Ambulatory Visit: Admitting: Nurse Practitioner

## 2023-08-24 ENCOUNTER — Other Ambulatory Visit: Payer: Self-pay | Admitting: Family Medicine

## 2023-08-24 DIAGNOSIS — Z1231 Encounter for screening mammogram for malignant neoplasm of breast: Secondary | ICD-10-CM

## 2023-09-12 ENCOUNTER — Other Ambulatory Visit: Payer: Self-pay | Admitting: Nurse Practitioner

## 2023-09-17 ENCOUNTER — Ambulatory Visit

## 2023-09-17 ENCOUNTER — Ambulatory Visit
Admission: RE | Admit: 2023-09-17 | Discharge: 2023-09-17 | Disposition: A | Discharge status: Home / Self Care | Source: Ambulatory Visit | Attending: Family Medicine | Admitting: Family Medicine

## 2023-09-17 DIAGNOSIS — Z1231 Encounter for screening mammogram for malignant neoplasm of breast: Secondary | ICD-10-CM | POA: Diagnosis not present

## 2023-10-08 DIAGNOSIS — E039 Hypothyroidism, unspecified: Secondary | ICD-10-CM | POA: Diagnosis not present

## 2023-10-08 DIAGNOSIS — G4710 Hypersomnia, unspecified: Secondary | ICD-10-CM | POA: Diagnosis not present

## 2023-10-08 DIAGNOSIS — R251 Tremor, unspecified: Secondary | ICD-10-CM | POA: Diagnosis not present

## 2023-10-18 DIAGNOSIS — E039 Hypothyroidism, unspecified: Secondary | ICD-10-CM | POA: Diagnosis not present

## 2023-10-18 DIAGNOSIS — F319 Bipolar disorder, unspecified: Secondary | ICD-10-CM | POA: Diagnosis not present

## 2023-11-15 DIAGNOSIS — F319 Bipolar disorder, unspecified: Secondary | ICD-10-CM | POA: Diagnosis not present

## 2023-11-15 DIAGNOSIS — G4700 Insomnia, unspecified: Secondary | ICD-10-CM | POA: Diagnosis not present

## 2023-11-15 DIAGNOSIS — F411 Generalized anxiety disorder: Secondary | ICD-10-CM | POA: Diagnosis not present

## 2023-11-18 DIAGNOSIS — E039 Hypothyroidism, unspecified: Secondary | ICD-10-CM | POA: Diagnosis not present

## 2023-11-18 DIAGNOSIS — F319 Bipolar disorder, unspecified: Secondary | ICD-10-CM | POA: Diagnosis not present

## 2023-11-22 ENCOUNTER — Other Ambulatory Visit: Payer: Self-pay | Admitting: Nurse Practitioner

## 2023-11-22 DIAGNOSIS — F411 Generalized anxiety disorder: Secondary | ICD-10-CM | POA: Diagnosis not present

## 2023-11-30 ENCOUNTER — Ambulatory Visit (INDEPENDENT_AMBULATORY_CARE_PROVIDER_SITE_OTHER): Admitting: Clinical

## 2023-11-30 DIAGNOSIS — Z0389 Encounter for observation for other suspected diseases and conditions ruled out: Secondary | ICD-10-CM
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
## 2023-12-10 DIAGNOSIS — F411 Generalized anxiety disorder: Secondary | ICD-10-CM | POA: Diagnosis not present

## 2023-12-12 ENCOUNTER — Telehealth: Payer: Self-pay | Admitting: Clinical

## 2023-12-12 NOTE — Telephone Encounter (Signed)
 TC to Kara Webster to follow up on options for other therapists and ADHD testing.  Kara Webster reported she was able to meet another therapist at [facility] who she feels connected with so she will continue at [facility].  Kara Webster requested to cancel our appt for 12/20/2023.    Kara Webster also reported she was able to talk with [facility] about the ADHD evaluation so she completed it and she was informed that the initial results did not indicated ADHD.  However, she has not spoken with the psychiatrist about it so she will follow up with the psychiatrist as well.  This Clinician cancelled our appointment and wished her well.

## 2023-12-18 DIAGNOSIS — E039 Hypothyroidism, unspecified: Secondary | ICD-10-CM | POA: Diagnosis not present

## 2023-12-18 DIAGNOSIS — F319 Bipolar disorder, unspecified: Secondary | ICD-10-CM | POA: Diagnosis not present

## 2023-12-20 ENCOUNTER — Ambulatory Visit: Admitting: Clinical

## 2023-12-24 DIAGNOSIS — F411 Generalized anxiety disorder: Secondary | ICD-10-CM | POA: Diagnosis not present

## 2024-01-07 DIAGNOSIS — F411 Generalized anxiety disorder: Secondary | ICD-10-CM | POA: Diagnosis not present

## 2024-01-08 ENCOUNTER — Telehealth: Payer: Self-pay | Admitting: Obstetrics

## 2024-01-08 ENCOUNTER — Other Ambulatory Visit: Payer: Self-pay | Admitting: Obstetrics

## 2024-01-08 DIAGNOSIS — N390 Urinary tract infection, site not specified: Secondary | ICD-10-CM | POA: Diagnosis not present

## 2024-01-08 DIAGNOSIS — R159 Full incontinence of feces: Secondary | ICD-10-CM

## 2024-01-08 DIAGNOSIS — R399 Unspecified symptoms and signs involving the genitourinary system: Secondary | ICD-10-CM | POA: Diagnosis not present

## 2024-01-08 DIAGNOSIS — N3941 Urge incontinence: Secondary | ICD-10-CM

## 2024-01-08 DIAGNOSIS — N816 Rectocele: Secondary | ICD-10-CM

## 2024-01-08 DIAGNOSIS — K5900 Constipation, unspecified: Secondary | ICD-10-CM

## 2024-01-08 NOTE — Telephone Encounter (Signed)
 Pt called in to say she didn't do PT and needs to get a new referral sent in.  PT will not schedule due to being closed.

## 2024-01-08 NOTE — Progress Notes (Signed)
 Pelvic floor PT referral resent.

## 2024-01-18 DIAGNOSIS — F319 Bipolar disorder, unspecified: Secondary | ICD-10-CM | POA: Diagnosis not present

## 2024-01-18 DIAGNOSIS — E039 Hypothyroidism, unspecified: Secondary | ICD-10-CM | POA: Diagnosis not present

## 2024-01-19 ENCOUNTER — Other Ambulatory Visit: Payer: Self-pay | Admitting: Nurse Practitioner

## 2024-01-21 DIAGNOSIS — F411 Generalized anxiety disorder: Secondary | ICD-10-CM | POA: Diagnosis not present

## 2024-01-23 DIAGNOSIS — Z85828 Personal history of other malignant neoplasm of skin: Secondary | ICD-10-CM | POA: Diagnosis not present

## 2024-01-23 DIAGNOSIS — L814 Other melanin hyperpigmentation: Secondary | ICD-10-CM | POA: Diagnosis not present

## 2024-01-23 DIAGNOSIS — Z08 Encounter for follow-up examination after completed treatment for malignant neoplasm: Secondary | ICD-10-CM | POA: Diagnosis not present

## 2024-01-23 DIAGNOSIS — D1801 Hemangioma of skin and subcutaneous tissue: Secondary | ICD-10-CM | POA: Diagnosis not present

## 2024-01-23 DIAGNOSIS — L821 Other seborrheic keratosis: Secondary | ICD-10-CM | POA: Diagnosis not present

## 2024-01-24 ENCOUNTER — Ambulatory Visit: Admitting: Nurse Practitioner

## 2024-02-01 DIAGNOSIS — Z136 Encounter for screening for cardiovascular disorders: Secondary | ICD-10-CM | POA: Diagnosis not present

## 2024-02-01 DIAGNOSIS — Z0000 Encounter for general adult medical examination without abnormal findings: Secondary | ICD-10-CM | POA: Diagnosis not present

## 2024-02-01 DIAGNOSIS — G2581 Restless legs syndrome: Secondary | ICD-10-CM | POA: Diagnosis not present

## 2024-02-01 DIAGNOSIS — F319 Bipolar disorder, unspecified: Secondary | ICD-10-CM | POA: Diagnosis not present

## 2024-02-01 DIAGNOSIS — R159 Full incontinence of feces: Secondary | ICD-10-CM | POA: Diagnosis not present

## 2024-02-01 DIAGNOSIS — E039 Hypothyroidism, unspecified: Secondary | ICD-10-CM | POA: Diagnosis not present

## 2024-02-05 DIAGNOSIS — F411 Generalized anxiety disorder: Secondary | ICD-10-CM | POA: Diagnosis not present

## 2024-02-07 ENCOUNTER — Other Ambulatory Visit: Payer: Self-pay

## 2024-02-07 NOTE — Telephone Encounter (Addendum)
 Med refill request: estradiol  & progesterone   Last AEX: 01/01/23 Next AEX: 02/11/24 Last MMG (if hormonal med) 09/17/23 BIRADS Cat 1 neg Refill authorized: last rx 02/05/23 #45 with 3 refills for both. Please advise

## 2024-02-08 DIAGNOSIS — R413 Other amnesia: Secondary | ICD-10-CM | POA: Diagnosis not present

## 2024-02-08 MED ORDER — ESTRADIOL 0.5 MG PO TABS: ORAL_TABLET | ORAL | 0 refills | Status: DC

## 2024-02-08 MED ORDER — PROGESTERONE MICRONIZED 100 MG PO CAPS: 100.0000 mg | ORAL_CAPSULE | Freq: Every day | ORAL | 0 refills | Status: DC

## 2024-02-11 ENCOUNTER — Encounter: Payer: Self-pay | Admitting: Obstetrics and Gynecology

## 2024-02-11 ENCOUNTER — Other Ambulatory Visit (HOSPITAL_COMMUNITY)
Admission: RE | Admit: 2024-02-11 | Discharge: 2024-02-11 | Disposition: A | Discharge status: Home / Self Care | Source: Ambulatory Visit | Attending: Obstetrics and Gynecology | Admitting: Obstetrics and Gynecology

## 2024-02-11 ENCOUNTER — Other Ambulatory Visit: Payer: Self-pay | Admitting: Family Medicine

## 2024-02-11 ENCOUNTER — Ambulatory Visit (INDEPENDENT_AMBULATORY_CARE_PROVIDER_SITE_OTHER): Admitting: Obstetrics and Gynecology

## 2024-02-11 VITALS — BP 118/78 | HR 60 | Ht 64.5 in | Wt 135.0 lb

## 2024-02-11 DIAGNOSIS — Z1151 Encounter for screening for human papillomavirus (HPV): Secondary | ICD-10-CM | POA: Diagnosis not present

## 2024-02-11 DIAGNOSIS — Z124 Encounter for screening for malignant neoplasm of cervix: Secondary | ICD-10-CM | POA: Insufficient documentation

## 2024-02-11 DIAGNOSIS — Z780 Asymptomatic menopausal state: Secondary | ICD-10-CM

## 2024-02-11 DIAGNOSIS — R159 Full incontinence of feces: Secondary | ICD-10-CM

## 2024-02-11 DIAGNOSIS — Z9189 Other specified personal risk factors, not elsewhere classified: Secondary | ICD-10-CM | POA: Diagnosis not present

## 2024-02-11 DIAGNOSIS — N952 Postmenopausal atrophic vaginitis: Secondary | ICD-10-CM

## 2024-02-11 DIAGNOSIS — B009 Herpesviral infection, unspecified: Secondary | ICD-10-CM

## 2024-02-11 DIAGNOSIS — Z79890 Hormone replacement therapy (postmenopausal): Secondary | ICD-10-CM

## 2024-02-11 DIAGNOSIS — Z01419 Encounter for gynecological examination (general) (routine) without abnormal findings: Secondary | ICD-10-CM | POA: Insufficient documentation

## 2024-02-11 DIAGNOSIS — R413 Other amnesia: Secondary | ICD-10-CM

## 2024-02-11 MED ORDER — ESTRADIOL 0.5 MG PO TABS: ORAL_TABLET | ORAL | 3 refills | Status: AC

## 2024-02-11 MED ORDER — ESTRADIOL 0.01 % VA CREA: TOPICAL_CREAM | VAGINAL | 1 refills | Status: AC

## 2024-02-11 MED ORDER — PROGESTERONE MICRONIZED 100 MG PO CAPS: 100.0000 mg | ORAL_CAPSULE | Freq: Every day | ORAL | 3 refills | Status: AC

## 2024-02-11 NOTE — Progress Notes (Unsigned)
 68 y.o. G10P2002 Divorced Caucasian female here for a breast and pelvic exam.    The patient is also followed for HRT. Wants to continue.    No vaginal bleeding.    Has seen Dr. Guadlupe for pelvic organ prolapse, hx midurethral sling, urinary urgency incontinence, constipation, and accidental leakage of stool.  Referral placed for pelvic floor therapy.  Patient is anxiously awaiting this consultation.   Saw dermatology recently.   PCP: Patient, [name]   No LMP recorded. Patient is postmenopausal.           Sexually active: No.  The current method of family planning is post menopausal status.    Menopausal hormone therapy:  Estrace  & progesterone   Exercising: Yes.    Walking dog  Smoker:  Former   OB History     Gravida  2   Para  2   Term  2   Preterm      AB      Living  2      SAB      IAB      Ectopic      Multiple      Live Births  2           HEALTH MAINTENANCE: Last 2 paps: 03/10/21 neg, 06/01/17 neg History of abnormal Pap or positive HPV:  no Mammogram:  09/17/23 Breast Density cat B, BIRADS Cat 1 neg  Colonoscopy:  05/31/23  Bone Density:  01/05/23  Result  osteopenia of spine and left hip     There is no immunization history on file for this patient.    reports that she quit smoking about 27 years ago. Her smoking use included cigarettes. She has never used smokeless tobacco. She reports that she does not currently use alcohol. She reports that she does not currently use drugs after having used the following drugs: Marijuana, Cocaine, Amphetamines, and Heroin.  Past Medical History:  Diagnosis Date   Bipolar 1 disorder (HCC)    Constipation    See's MD for issue and it is resolving per pt OK with issue for  a month now   Depression    Hearing loss    Hypothyroid    Rectocele    RLS (restless legs syndrome)    Squamous cell carcinoma of skin 10/18/2020   Mid Tip of Nose (in situ)    Past Surgical History:  Procedure  Laterality Date   BLADDER SURGERY  1980   x2   COLONOSCOPY     TUBAL LIGATION      Current Outpatient Medications  Medication Sig Dispense Refill   CALCIUM PO Take by mouth.     cholecalciferol (VITAMIN D3) 25 MCG (1000 UNIT) tablet Take 1,000 Units by mouth daily.     divalproex  (DEPAKOTE  ER) 500 MG 24 hr tablet Take 500 mg by mouth 2 (two) times daily.     DULoxetine  (CYMBALTA ) 20 MG capsule Take 40 mg by mouth daily.     esomeprazole  (NEXIUM ) 40 MG capsule TAKE 1 CAPSULE BY MOUTH 2 TIMES A DAY BEFORE A MEAL 60 capsule 1   estradiol  (ESTRACE ) 0.5 MG tablet Take 1/2 tablet (0.25 mg) by mouth daily. 45 tablet 0   gabapentin  (NEURONTIN ) 300 MG capsule Take 1 capsule (300 mg total) by mouth at bedtime. 14 capsule 0   lamoTRIgine  (LAMICTAL ) 200 MG tablet Take 200 mg by mouth daily.     levothyroxine  (SYNTHROID ) 50 MCG tablet Take 1 tablet (50 mcg  total) by mouth daily at 6 (six) AM. 14 tablet 0   MAGNESIUM  PO Take by mouth.     Multiple Vitamin (MULTIVITAMIN) capsule Take 1 capsule by mouth daily.     omeprazole (PRILOSEC) 40 MG capsule 1 capsule 1/2 to 1 hour before morning meal Orally Once a day     polyethylene glycol powder (GLYCOLAX/MIRALAX) 17 GM/SCOOP powder Take 1 Container by mouth once.     progesterone  (PROMETRIUM ) 100 MG capsule Take 1 capsule (100 mg total) by mouth at bedtime. 90 capsule 0   No current facility-administered medications for this visit.    ALLERGIES: Aripiprazole, Oxycodone hcl, Cetirizine, and Codeine  Family History  Problem Relation Age of Onset   Cancer Mother        leukemia    Hypertension Mother    Cancer Father        lung    Alcohol abuse Father    Diabetes Sister    Hyperlipidemia Sister    Hypertension Sister    Depression Brother    Diabetes Brother    Hypertension Brother    Alcohol abuse Brother    Alcohol abuse Maternal Uncle    Alcohol abuse Paternal Aunt    Alcohol abuse Maternal Grandfather    Bladder Cancer Neg Hx    Uterine  cancer Neg Hx    Colon cancer Neg Hx    Colon polyps Neg Hx    Esophageal cancer Neg Hx    Rectal cancer Neg Hx    Stomach cancer Neg Hx     Review of Systems  All other systems reviewed and are negative.   PHYSICAL EXAM:  BP 118/78 (BP Location: Left Arm, Patient Position: Sitting)   Pulse 60   Ht 5' 4.5 (1.638 m)   Wt 135 lb (61.2 kg)   SpO2 99%   BMI 22.81 kg/m     General appearance: alert, cooperative and appears stated age Head: normocephalic, without obvious abnormality, atraumatic Neck: no adenopathy, supple, symmetrical, trachea midline and thyroid  normal to inspection and palpation Lungs: clear to auscultation bilaterally Breasts: normal appearance, no masses or tenderness, No nipple retraction or dimpling, No nipple discharge or bleeding, No axillary adenopathy Heart: regular rate and rhythm Abdomen: soft, non-tender; no masses, no organomegaly Extremities: extremities normal, atraumatic, no cyanosis or edema Skin: skin color, texture, turgor normal. No rashes or lesions Lymph nodes: cervical, supraclavicular, and axillary nodes normal. Neurologic: grossly normal  Pelvic: External genitalia:  no lesions              No abnormal inguinal nodes palpated.              Urethra:  normal appearing urethra with no masses, tenderness or lesions              Bartholins and Skenes: normal                 Vagina: normal appearing vagina with normal color and discharge, no lesions.  Second degree rectocele.              Cervix: no lesions              Pap taken: yes Bimanual Exam:  Uterus:  normal size, contour, position, consistency, mobility, non-tender              Adnexa: no mass, fullness, tenderness              Rectal exam: yes.  Confirms.  Anus:  normal sphincter tone, no lesions  Chaperone was present for exam:  Kari HERO, CMA  ASSESSMENT: Encounter for breast and pelvic exam.  GYN exam for high risk Medicare condition.  Hx HSV. Cervical cancer  screening.  Menopause.  HRT.  Vaginal atrophy. Osteopenia.  Second degree rectocele.  Fecal incontinence.    PLAN: Mammogram screening discussed. Self breast awareness reviewed. Pap and HRV collected:  yes Guidelines for Calcium, Vitamin D , regular exercise program including cardiovascular and weight bearing exercise. Medication refills:  Estrace  0.5 mg, 1/2 tab, po daily and Prometrium  100 ng q hs.  Estradiol  cream 1/2 gram nightly for 2 weeks and ten 1/2 gram at hs  2 -3 times per week.  Discused WHI and use of HRT which can increase risk of PE, DVT, MI, stroke and breast cancer.  Benefits of treating vasomotor symptoms and reducing risk of osteoporosis also reviewed. Dexa from 2024 reviewed.   Dexa ordered at [facility] for 2026. Due in October. Patient provided phone number to call and schedule. Try Metamucil instead of Miralax for fecal incontinence.  Follow up:  yearly and prn.     Additional counseling given.  yes. 30 min  total time was spent for this patient encounter, including preparation, face-to-face counseling with the patient, coordination of care, and documentation of the encounter in addition to doing the breast and pelvic exam and pap.

## 2024-02-11 NOTE — Patient Instructions (Addendum)
 Phone number for scheduling bone density at [facility], [phone].    Psyllium Capsules What is this medication? PSYLLIUM (SIL i yum) may support digestion and heart health. It works by increasing the bulk of your stool. This increases pressure, which helps the muscles in your intestine move stool. It also reduces the amount of cholesterol your body absorbs from food. This medicine may be used for other purposes; ask your health care provider or pharmacist if you have questions. COMMON BRAND NAME(S): GenFiber, Konsyl, Metamucil, Metamucil MultiHealth, Natural Fiber Laxative, Reguloid What should I tell my care team before I take this medication? They need to know if you have any of these conditions: Nausea or vomiting Stomach pain Sudden change in bowel habits that lasts more than 2 weeks Trouble swallowing An unusual or allergic reaction to psyllium, other medications, foods, dyes, or preservatives Pregnant or trying to get pregnant Breastfeeding How should I use this medication? Take this medication by mouth with a full glass of water. Follow the directions on the package labeling, or take as directed by your care team. Take your medication at regular intervals. Do not take your medication more often than directed. Talk to your care team about the use of this medication in children. While this medication may be prescribed for children as young as 13 years of age for selected conditions, precautions do apply. Overdosage: If you think you have taken too much of this medicine contact a poison control center or emergency room at once. NOTE: This medicine is only for you. Do not share this medicine with others. What if I miss a dose? If you miss a dose, take it as soon as you can. If it is almost time for your next dose, take only that dose. Do not take double or extra doses. What may interact with this medication? Interactions are not expected. Take this product at least 2 hours before or  after other medications. This list may not describe all possible interactions. Give your health care provider a list of all the medicines, herbs, non-prescription drugs, or dietary supplements you use. Also tell them if you smoke, drink alcohol, or use illegal drugs. Some items may interact with your medicine. What should I watch for while using this medication? Check with your care team if your symptoms do not start to get better or if they get worse. Stop using this medication and contact your care team if you have rectal bleeding or if you have to treat your constipation for more than 1 week. These could be signs of a more serious condition. Drink several glasses of water a day while you are taking this medication. This will help to relieve constipation and prevent dehydration. What side effects may I notice from receiving this medication? Side effects that you should report to your care team as soon as possible: Allergic reactions--skin rash, itching, hives, swelling of the face, lips, tongue, or throat Choking--chest pain, trouble swallowing or breathing, vomiting Side effects that usually do not require medical attention (report to your care team if they continue or are bothersome): Bloating Diarrhea Gas Nausea Stomach pain This list may not describe all possible side effects. Call your doctor for medical advice about side effects. You may report side effects to FDA at 1-800-FDA-1088. Where should I keep my medication? Keep out of the reach of children and pets. Store at room temperature between 15 and 30 degrees C (59 and 86 degrees F). Protect from moisture. Get rid of any unused medication after  the expiration date. To get rid of medications that are no longer needed or have expired: Take the medication to a medication take-back program. Check with your pharmacy or law enforcement to find a location. If you cannot return the medication, check the label or package insert to see if the  medication should be thrown out in the garbage or flushed down the toilet. If you are not sure, ask your care team. If it is safe to put it in the trash, pour the medication out of the container. Mix the medication with cat litter, dirt, coffee grounds, or other unwanted substance. Seal the mixture in a bag or container. Put it in the trash. NOTE: This sheet is a summary. It may not cover all possible information. If you have questions about this medicine, talk to your doctor, pharmacist, or health care provider.  2025 Elsevier/Gold Standard (2023-07-09 00:00:00)  EXERCISE AND DIET:  We recommended that you start or continue a regular exercise program for good health. Regular exercise means any activity that makes your heart beat faster and makes you sweat.  We recommend exercising at least 30 minutes per day at least 3 days a week, preferably 4 or 5.  We also recommend a diet low in fat and sugar.  Inactivity, poor dietary choices and obesity can cause diabetes, heart attack, stroke, and kidney damage, among others.    ALCOHOL AND SMOKING:  Women should limit their alcohol intake to no more than 7 drinks/beers/glasses of wine (combined, not each!) per week. Moderation of alcohol intake to this level decreases your risk of breast cancer and liver damage. And of course, no recreational drugs are part of a healthy lifestyle.  And absolutely no smoking or even second hand smoke. Most people know smoking can cause heart and lung diseases, but did you know it also contributes to weakening of your bones? Aging of your skin?  Yellowing of your teeth and nails?  CALCIUM AND VITAMIN D :  Adequate intake of calcium and Vitamin D  are recommended.  The recommendations for exact amounts of these supplements seem to change often, but generally speaking 600 mg of calcium (either carbonate or citrate) and 800 units of Vitamin D  per day seems prudent. Certain women may benefit from higher intake of Vitamin D .  If you are  among these women, your doctor will have told you during your visit.    PAP SMEARS:  Pap smears, to check for cervical cancer or precancers,  have traditionally been done yearly, although recent scientific advances have shown that most women can have pap smears less often.  However, every woman still should have a physical exam from her gynecologist every year. It will include a breast check, inspection of the vulva and vagina to check for abnormal growths or skin changes, a visual exam of the cervix, and then an exam to evaluate the size and shape of the uterus and ovaries.  And after 66 years of age, a rectal exam is indicated to check for rectal cancers. We will also provide age appropriate advice regarding health maintenance, like when you should have certain vaccines, screening for sexually transmitted diseases, bone density testing, colonoscopy, mammograms, etc.   MAMMOGRAMS:  All women over 1 years old should have a yearly mammogram. Many facilities now offer a 3D mammogram, which may cost around $50 extra out of pocket. If possible,  we recommend you accept the option to have the 3D mammogram performed.  It both reduces the number of women who  will be called back for extra views which then turn out to be normal, and it is better than the routine mammogram at detecting truly abnormal areas.    COLONOSCOPY:  Colonoscopy to screen for colon cancer is recommended for all women at age 9.  We know, you hate the idea of the prep.  We agree, BUT, having colon cancer and not knowing it is worse!!  Colon cancer so often starts as a polyp that can be seen and removed at colonscopy, which can quite literally save your life!  And if your first colonoscopy is normal and you have no family history of colon cancer, most women don't have to have it again for 10 years.  Once every ten years, you can do something that may end up saving your life, right?  We will be happy to help you get it scheduled when you are  ready.  Be sure to check your insurance coverage so you understand how much it will cost.  It may be covered as a preventative service at no cost, but you should check your particular policy.

## 2024-02-12 ENCOUNTER — Ambulatory Visit: Payer: Self-pay | Admitting: Obstetrics and Gynecology

## 2024-02-12 LAB — CYTOLOGY - PAP
Adequacy: ABSENT
Comment: NEGATIVE
Diagnosis: NEGATIVE
High risk HPV: NEGATIVE

## 2024-02-17 DIAGNOSIS — E039 Hypothyroidism, unspecified: Secondary | ICD-10-CM | POA: Diagnosis not present

## 2024-02-17 DIAGNOSIS — F319 Bipolar disorder, unspecified: Secondary | ICD-10-CM | POA: Diagnosis not present

## 2024-02-18 NOTE — Telephone Encounter (Signed)
 EPIC message to Urogyn to f/u on scheduling.

## 2024-02-18 NOTE — Telephone Encounter (Signed)
 Per Urogyn, referred to pelvic floor PT, patient is scheduled for consult on 02/25/24.   Spoke with patient, denies any new symptoms. States she has cut back on caffeine. Advised patient she was referred to specialist, Dr. Guadlupe at [facility]. Advised to f/u with Urogynecology if symptoms persist. If new symptoms develop, return call to [facility]. Advised I will update Dr. Nikki and f/u with any additional recommendations. Patient did acknowledge she is experiencing some changes with her memory and her daughter is assisting with theses appts, has a MRI scheduled on 03/03/24.   Routing to Dr. Nikki for review.

## 2024-02-18 NOTE — Telephone Encounter (Signed)
 Per review of EPIC, patient has pending referral to Urogynecology placed 01/08/24. Referral has been reviewed by Dr. Guadlupe, not scheduled to date.

## 2024-02-19 NOTE — Telephone Encounter (Signed)
 Patient notified. Advised no additional recommendations. Patient verbalizes understanding.  Encounter closed.

## 2024-02-25 ENCOUNTER — Encounter: Payer: Self-pay | Admitting: Family Medicine

## 2024-02-25 ENCOUNTER — Ambulatory Visit: Admitting: Physical Therapy

## 2024-02-25 ENCOUNTER — Other Ambulatory Visit: Payer: Self-pay

## 2024-02-25 ENCOUNTER — Encounter: Payer: Self-pay | Admitting: Physical Therapy

## 2024-02-25 DIAGNOSIS — R2689 Other abnormalities of gait and mobility: Secondary | ICD-10-CM | POA: Diagnosis present

## 2024-02-25 DIAGNOSIS — M6281 Muscle weakness (generalized): Secondary | ICD-10-CM | POA: Diagnosis present

## 2024-02-25 NOTE — Therapy (Signed)
 OUTPATIENT PHYSICAL THERAPY FEMALE PELVIC EVALUATION     Patient Name: Kara Webster MRN: [id number] DOB:[birth year], 68 y.o., female Today's Date: 02/25/2024   END OF SESSION:      PT End of Session - 02/25/24 1154     Visit Number 1    Date for Recertification  08/25/24    Authorization Type [employer] [id number]  no author req    Progress Note Due on Visit 10    PT Start Time 1106    PT Stop Time 1153    PT Time Calculation (min) 47 min    Activity Tolerance Patient tolerated treatment well    Behavior During Therapy WFL for tasks assessed/performed              Past Medical History:  Diagnosis Date   Bipolar 1 disorder (HCC)     Constipation      See's MD for issue and it is resolving per pt OK with issue for  a month now   Depression     Hearing loss     Hypothyroid     Rectocele     RLS (restless legs syndrome)     Squamous cell carcinoma of skin 10/18/2020    Mid Tip of Nose (in situ)             Past Surgical History:  Procedure Laterality Date   BLADDER SURGERY   1980    x2   COLONOSCOPY       TUBAL LIGATION                Patient Active Problem List    Diagnosis Date Noted   Bipolar 1 disorder, mixed, moderate (HCC) 05/04/2023   Incontinence of feces 04/24/2023   Urge urinary incontinence 02/19/2023   History of pelvic surgery 02/19/2023   Pelvic organ prolapse quantification stage 2 rectocele 02/19/2023   Bipolar I disorder with mania (HCC) 12/24/2020   Symptoms, such as flushing, sleeplessness, headache, lack of concentration, associated with the menopause 07/19/2015   Constipation 10/07/2013   Episodic mood disorder 05/28/2013   History of substance abuse (HCC) 05/28/2013      PCP: Dayna Motto, DO   REFERRING PROVIDER: Guadlupe Lianne DASEN, MD    REFERRING DIAG: N81.6 (ICD-10-CM) - Pelvic organ prolapse quantification stage 2 rectocele K59.00 (ICD-10-CM) - Constipation, unspecified constipation type R15.9 (ICD-10-CM) - Incontinence of feces,  unspecified fecal incontinence type N39.41 (ICD-10-CM) - Urge urinary incontinence   THERAPY DIAG: R26.89 Other abnormalities of gait and mobility                               M 62.81 Muscle weakness generalized     Rationale for Evaluation and Treatment: Rehabilitation   ONSET DATE: up to 5 years, worse in the last year   SUBJECTIVE:  SUBJECTIVE STATEMENT: Patient reports that she had 2 bladder tacks long time ago- 40 years ago and 30 years ago. Now when she has to use the bathroom, she has to go, will leak, one she starts, she cannot stop.  Had constipation for years and now it is a lot better, she was taking different kinds of medications and she had problem with diarrhea.  Not feeing well in general, she has trouble remembering things, awaiting MRI. 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 No PRECAUTIONS: None   RED FLAGS: None       WEIGHT BEARING RESTRICTIONS: No   FALLS:  Has patient fallen in last 6 months? no   OCCUPATION: retired   ACTIVITY LEVEL : sometimes walks her dog   PLOF: Independent, lives by herself   PATIENT GOALS: to be able to use the bathroom normally, not leak   PERTINENT HISTORY:  Bladder surgery, tubal ligation, bipolar disorder, constipation, depression, hypothyroidism, rectocele grade 2 Sexual abuse: No   BOWEL MOVEMENT: Pain with bowel movement: No Type of bowel movement:Type (Bristol Stool Scale) 4 but varies, Frequency no, Strain yes, and Splinting yes sometimes ( rectocele) Fully empty rectum: No not always Leakage: Yes:  Urgency: No Pads: no Fiber  supplement/laxative No   URINATION: Pain with urination: No Fully empty bladder: Yes:  Stream: Strong Urgency: No Frequency: every 30 mins, 1 hr Nocturia: 1  Fluid Intake: water- is not sure if 1-4 bottles (32 oz ) caffeine gives her too much urgency Leakage: Urge to void  Pads: Yes: 2-3   INTERCOURSE: not active               PREGNANCY: Vaginal deliveries 2 Tearing No Episiotomy No C-section deliveries 0 Currently pregnant No   PROLAPSE: Pressure sometimes     OBJECTIVE:  Note: Objective measures were completed at Evaluation unless otherwise noted.   02/25/24: PATIENT SURVEYS:    PFIQ-7: to be completed   COGNITION: Overall cognitive status: Within functional limits for tasks assessed                          SENSATION: Light touch: Appears intact     FUNCTIONAL TESTS:  Squat: pelvic sway bilat Single leg stance: bilateral hip dip             Rt: weakness             Lt: weakness Curl-up test: needs to use arms Sit-up test: needs to use arms  Active straight leg raise: positive for weakness     GAIT: Assistive device utilized: None Comments: WNL   POSTURE: rounded shoulders and forward head     LUMBARAROM/PROM: full     PALPATION: General: restrictions throughout abdomen   Abdominal: weakness, 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  Breathing: upper chest Tenderness: no Scar tissue: no Diastasis: no                 External Perineal Exam: dryness and decreased estrogen in tissues                             Internal Pelvic Floor: able to lift a little, pelvic floor weakness present  as well as anterior and posterior vaginal wall laxity   Patient confirms identification and approves PT to assess internal pelvic floor and treatment Yes   PELVIC MMT:   MMT eval  Vaginal 1/5, 3 s   Internal Anal Sphincter 3/5   External Anal Sphincter 2/5   Puborectalis    (Blank rows = not tested)         TONE: low   PROLAPSE: Anterior and posterior vaginal wall  laxity   TODAY'S TREATMENT:                                                                                                                              DATE:  02/25/24 EVAL  Examination completed, findings reviewed, pt educated on POC, HEP, and female pelvic floor anatomy, reasoning with pelvic floor assessment internally with pt consent. Pt motivated to participate in PT and agreeable to attempt recommendations.            PATIENT EDUCATION/ there acts  Education details: Pt was educated on relevant anatomy, exam findings, home exercise program, plan of care, expectations of PT and bladder diary, healthy bladder habits  Person educated: Patient Education method: Explanation, Demonstration, Tactile cues, Verbal cues, and Handouts Education comprehension: verbalized understanding   HOME EXERCISE PROGRAM: Access Code: [email or web address] URL: [email or web address] Date: 02/25/2024 Prepared by: Cori Faisal Stradling  Patient Education - Urinary Urge Control Techniques - Bladder Log - Bowel Emptying Techniques   ASSESSMENT:   CLINICAL IMPRESSION: Patient is a 68 y.o. female who was seen today for physical therapy evaluation and treatment for pelvic organ prolapse and urinary incontinence as well as fecal incontinence. Exam findings notable to pelvic floor weakness, external tissue dryness, anterior and posterior vaginal wall laxity, stool in rectum. Patient has bilateral hip weakness and hand tremor as well. She has a history of 2 bladder tacks, has some restrictions throughout her abdomen, abdominal scar and abdominal weakness. Patient has had urge incontinence for the last 5 years and it worsened in the last year or so. Patient is not sure what normal bladder habits are and has had some memory problems, getting imaging soon. She will benefit from PT to address deficits   OBJECTIVE IMPAIRMENTS: decreased activity tolerance, decreased coordination, decreased endurance, decreased mobility,  decreased ROM, decreased strength, increased fascial restrictions, increased muscle spasms, impaired flexibility, impaired tone, improper body mechanics, postural dysfunction, and pain.    ACTIVITY LIMITATIONS: continence and toileting   PARTICIPATION LIMITATIONS: community activity   PERSONAL FACTORS: Behavior pattern and Time since onset of injury/illness/exacerbation are also affecting patient's functional outcome.    REHAB POTENTIAL: Good   CLINICAL DECISION MAKING: Evolving/moderate complexity   EVALUATION COMPLEXITY: Moderate     GOALS: Goals reviewed with patient? Yes   SHORT TERM GOALS: Target date: 03/24/2024     Pt will be independent with HEP in order to improve activity tolerance.    Baseline: Goal status: INITIAL   2.  Patient will be  educated on healthy bladder habits   Baseline:  Goal status: INITIAL   3.  Patient will fill out bladder diary    Baseline:  Goal status: INITIAL   4.  Patient will be I with healthy bowel PT recommendations Baseline:  Goal status: INITIAL   5.  Patient will be educated on abdominal massage and cupping for her scar Baseline:  Goal status: INITIAL   LONG TERM GOALS: Target date: 05/19/2024     Pt will be independent with advanced HEP in order to improve activity tolerance.    Baseline:  Goal status: INITIAL   2.  Bilateral hip strength to 5/5 Baseline:  Goal status: INITIAL   3.  Patient will soak 0 pads/ day Baseline:  Goal status: INITIAL   4.  Patient will report voiding every 2 hrs Baseline:  Goal status: INITIAL   5.  Patient will demonstrate better pelvic floor strength to at least 4/5 MMT Baseline:  Goal status: INITIAL   6.  Patient will report 0 fecal incontinence episodes Baseline:  Goal status: INITIAL   PLAN:   PT FREQUENCY: 1-2x/week   PT DURATION: 3 months    PLANNED INTERVENTIONS: 97164- PT Re-evaluation, 97110-Therapeutic exercises, 97530- Therapeutic activity, 97112- Neuromuscular  re-education, 97535- Self Care, 02859- Manual therapy, [phone]- Gait training, [phone]- Aquatic Therapy, [phone]- Electrical stimulation (unattended), [phone]- Traction (mechanical), [id number]- Ionotophoresis 4mg /ml Dexamethasone , 79439 (1-2 muscles), 20561 (3+ muscles)- Dry Needling, Patient/Family education, Balance training, Taping, Joint mobilization, Joint manipulation, Spinal manipulation, Spinal mobilization, Scar mobilization, Vestibular training, Cryotherapy, Moist heat, and Biofeedback   PLAN FOR NEXT SESSION: continue global strengthening, pressure management exercises with exhale, review urge suppression, constipation strategies    Cori Florida, PT, DPT  [facility] [address] [place], [place] [address] Phone # [phone] Fax [phone]

## 2024-03-03 ENCOUNTER — Inpatient Hospital Stay: Admission: RE | Admit: 2024-03-03 | Discharge: 2024-03-03 | Attending: Family Medicine

## 2024-03-03 DIAGNOSIS — R251 Tremor, unspecified: Secondary | ICD-10-CM | POA: Diagnosis not present

## 2024-03-03 DIAGNOSIS — R413 Other amnesia: Secondary | ICD-10-CM

## 2024-03-04 ENCOUNTER — Encounter: Payer: Self-pay | Admitting: Physical Therapy

## 2024-03-04 ENCOUNTER — Ambulatory Visit: Admitting: Physical Therapy

## 2024-03-04 DIAGNOSIS — M6281 Muscle weakness (generalized): Secondary | ICD-10-CM

## 2024-03-04 DIAGNOSIS — R2689 Other abnormalities of gait and mobility: Secondary | ICD-10-CM | POA: Diagnosis not present

## 2024-03-04 NOTE — Therapy (Signed)
 OUTPATIENT PHYSICAL THERAPY FEMALE PELVIC TREATMENT     Patient Name: Kara Webster MRN: [id number] DOB:November 03, 1955, 68 y.o., female Today's Date: 02/25/2024   END OF SESSION:      PT End of Session - 03/04/24 0929     Visit Number 2    Date for Recertification  08/25/24    Authorization Type [employer] [id number]  no author req    Progress Note Due on Visit 10    PT Start Time 0930    PT Stop Time 1045    PT Time Calculation (min) 75 min    Activity Tolerance Patient tolerated treatment well    Behavior During Therapy WFL for tasks assessed/performed              Past Medical History:  Diagnosis Date   Bipolar 1 disorder (HCC)     Constipation      See's MD for issue and it is resolving per pt OK with issue for  a month now   Depression     Hearing loss     Hypothyroid     Rectocele     RLS (restless legs syndrome)     Squamous cell carcinoma of skin 10/18/2020    Mid Tip of Nose (in situ)             Past Surgical History:  Procedure Laterality Date   BLADDER SURGERY   1980    x2   COLONOSCOPY       TUBAL LIGATION                Patient Active Problem List    Diagnosis Date Noted   Bipolar 1 disorder, mixed, moderate (HCC) 05/04/2023   Incontinence of feces 04/24/2023   Urge urinary incontinence 02/19/2023   History of pelvic surgery 02/19/2023   Pelvic organ prolapse quantification stage 2 rectocele 02/19/2023   Bipolar I disorder with mania (HCC) 12/24/2020   Symptoms, such as flushing, sleeplessness, headache, lack of concentration, associated with the menopause 07/19/2015   Constipation 10/07/2013   Episodic mood disorder 05/28/2013   History of substance abuse (HCC) 05/28/2013      PCP: Dayna Motto, DO   REFERRING PROVIDER: Guadlupe Lianne DASEN, MD    REFERRING DIAG: N81.6 (ICD-10-CM) - Pelvic organ prolapse quantification stage 2 rectocele K59.00 (ICD-10-CM) - Constipation, unspecified constipation type R15.9 (ICD-10-CM) - Incontinence of feces,  unspecified fecal incontinence type N39.41 (ICD-10-CM) - Urge urinary incontinence   THERAPY DIAG: R26.89 Other abnormalities of gait and mobility                               M 62.81 Muscle weakness generalized     Rationale for Evaluation and Treatment: Rehabilitation   ONSET DATE: up to 5 years, worse in the last year   SUBJECTIVE:  SUBJECTIVE STATEMENT: Patient had MRI yesterday Drinks water throughout the day Sips water Was not able to look at printout      Patient reports that she had 2 bladder tacks long time ago- 40 years ago and 30 years ago. Now when she has to use the bathroom, she has to go, will leak, when she starts, she cannot stop.  Had constipation for years and now it is a lot better, she was taking different kinds of medications and she had problem with diarrhea.  Not feeing well in general, she has trouble remembering things, awaiting MRI. Sometimes exhausted and sleeps all day Taking medicine for depression and mood swings, can make her sleepy as well. 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 No PRECAUTIONS: None   RED FLAGS: None       WEIGHT BEARING RESTRICTIONS: No   FALLS:  Has patient fallen in last 6 months? no   OCCUPATION: retired   ACTIVITY LEVEL : sometimes walks her dog   PLOF: Independent, lives by herself   PATIENT GOALS: to be able to use the bathroom normally, not leak   PERTINENT HISTORY:  Bladder surgery, tubal ligation, bipolar disorder, constipation, depression, hypothyroidism, rectocele grade 2 Sexual abuse: No   BOWEL MOVEMENT: Pain with bowel movement: No Type of bowel movement:Type (Bristol Stool Scale) 4 but varies, Frequency no, Strain yes, and  Splinting yes sometimes ( rectocele) Fully empty rectum: No not always Leakage: Yes:  Urgency: No Pads: no Fiber supplement/laxative No   URINATION: Pain with urination: No Fully empty bladder: Yes:  Stream: Strong Urgency: No Frequency: every 30 mins, 1 hr- when she drinks anything, makes her pee Nocturia: 1  Fluid Intake: water- is not sure if 1-4 bottles (32 oz ) caffeine gives her too much urgency Leakage: Urge to void  Pads: Yes: 2-3   INTERCOURSE: not active               PREGNANCY: Vaginal deliveries 2 Tearing No Episiotomy No C-section deliveries 0 Currently pregnant No   PROLAPSE: Pressure sometimes     OBJECTIVE:  Note: Objective measures were completed at Evaluation unless otherwise noted.   02/25/24: PATIENT SURVEYS:    PFIQ-7: to be completed   COGNITION: Overall cognitive status: Within functional limits for tasks assessed                          SENSATION: Light touch: Appears intact     FUNCTIONAL TESTS:  Squat: pelvic sway bilat Single leg stance: bilateral hip dip             Rt: weakness             Lt: weakness Curl-up test: needs to use arms Sit-up test: needs to use arms  Active straight leg raise: positive for weakness     GAIT: Assistive device utilized: None Comments: WNL   POSTURE: rounded shoulders and forward head     LUMBARAROM/PROM: full     PALPATION: General: restrictions throughout abdomen   Abdominal: weakness, unable to dem a crunch  Breathing: upper chest Tenderness: no Scar tissue: no Diastasis: no                 External Perineal Exam: dryness and decreased estrogen in tissues  Internal Pelvic Floor: able to lift a little, pelvic floor weakness present as well as anterior and posterior vaginal wall laxity   Patient confirms identification and approves PT to assess internal pelvic floor and treatment Yes   PELVIC MMT:   MMT eval  Vaginal 1/5, 3 s   Internal Anal  Sphincter 3/5   External Anal Sphincter 2/5   Puborectalis    (Blank rows = not tested)         TONE: low   PROLAPSE: Anterior and posterior vaginal wall laxity   TODAY'S TREATMENT:                                                                                                                              DATE:  03/04/2024 Review of progress Urge suppression strategies- education Seated ball press with exhale 20 reps bilateral Standing extensions with red theraband with exhale 10 reps  Hip adduction with ball with red theraband hold with exhale 10 reps Pick up #10 kb off bench with exhale ( significant VC's, TC's need for more optimal coordination with breath)     02/25/24 EVAL  Examination completed, findings reviewed, pt educated on POC, HEP, and female pelvic floor anatomy, reasoning with pelvic floor assessment internally with pt consent. Pt motivated to participate in PT and agreeable to attempt recommendations.            PATIENT EDUCATION/ there acts  Education details: Pt was educated on relevant anatomy, exam findings, home exercise program, plan of care, expectations of PT and bladder diary, healthy bladder habits  Person educated: Patient Education method: Explanation, Demonstration, Tactile cues, Verbal cues, and Handouts Education comprehension: verbalized understanding   HOME EXERCISE PROGRAM: Access Code: [email or web address] URL: [email or web address] Date: 02/25/2024 Prepared by: Cori Khamron Gellert  Patient Education - Urinary Urge Control Techniques - Bladder Log - Bowel Emptying Techniques   ASSESSMENT:   CLINICAL IMPRESSION: Patient is a 68 y.o. female who was seen today for physical therapy treatment for pelvic organ prolapse and urinary incontinence as well as fecal incontinence. She did well with education and her initial exercises focusing on deep core engagement with pelvic floor lift. Fatigued a little during session and had a little difficulty  with coordination with breathing. VC's for dropped shoulders needed. Patient with bilateral arm weakness and compensates with her breath. Helped patient download medbridge Go app for easier access to HEPPatient present with global weakness. She is HOH. She will benefit from PT to address deficits   OBJECTIVE IMPAIRMENTS: decreased activity tolerance, decreased coordination, decreased endurance, decreased mobility, decreased ROM, decreased strength, increased fascial restrictions, increased muscle spasms, impaired flexibility, impaired tone, improper body mechanics, postural dysfunction, and pain.    ACTIVITY LIMITATIONS: continence and toileting   PARTICIPATION LIMITATIONS: community activity   PERSONAL FACTORS: Behavior pattern and Time since onset of injury/illness/exacerbation are also affecting patient's functional outcome.    REHAB POTENTIAL: Good   CLINICAL DECISION MAKING: Evolving/moderate complexity  EVALUATION COMPLEXITY: Moderate     GOALS: Goals reviewed with patient? Yes   SHORT TERM GOALS: Target date: 03/24/2024     Pt will be independent with HEP in order to improve activity tolerance.    Baseline: Goal status: INITIAL   2.  Patient will be educated on healthy bladder habits   Baseline:  Goal status: INITIAL   3.  Patient will fill out bladder diary    Baseline:  Goal status: INITIAL   4.  Patient will be I with healthy bowel PT recommendations Baseline:  Goal status: INITIAL   5.  Patient will be educated on abdominal massage and cupping for her scar Baseline:  Goal status: INITIAL   LONG TERM GOALS: Target date: 05/19/2024     Pt will be independent with advanced HEP in order to improve activity tolerance.    Baseline:  Goal status: INITIAL   2.  Bilateral hip strength to 5/5 Baseline:  Goal status: INITIAL   3.  Patient will soak 0 pads/ day Baseline:  Goal status: INITIAL   4.  Patient will report voiding every 2 hrs Baseline:  Goal  status: INITIAL   5.  Patient will demonstrate better pelvic floor strength to at least 4/5 MMT Baseline:  Goal status: INITIAL   6.  Patient will report 0 fecal incontinence episodes Baseline:  Goal status: INITIAL   PLAN:   PT FREQUENCY: 1-2x/week   PT DURATION: 3 months    PLANNED INTERVENTIONS: 97164- PT Re-evaluation, 97110-Therapeutic exercises, 97530- Therapeutic activity, 97112- Neuromuscular re-education, 97535- Self Care, 02859- Manual therapy, [phone]- Gait training, [phone]- Aquatic Therapy, [phone]- Electrical stimulation (unattended), [phone]- Traction (mechanical), [id number]- Ionotophoresis 4mg /ml Dexamethasone , 20560 (1-2 muscles), 20561 (3+ muscles)- Dry Needling, Patient/Family education, Balance training, Taping, Joint mobilization, Joint manipulation, Spinal manipulation, Spinal mobilization, Scar mobilization, Vestibular training, Cryotherapy, Moist heat, and Biofeedback   PLAN FOR NEXT SESSION: continue arm strengthening, global strengthening, pressure management exercises with exhale, review urge suppression, constipation strategies    Cori Florida, PT, DPT  [facility] [address], [address] [place], [place] [address] Phone # [phone] Fax [phone]

## 2024-03-11 ENCOUNTER — Ambulatory Visit: Admitting: Physical Therapy

## 2024-03-11 ENCOUNTER — Encounter: Payer: Self-pay | Admitting: Physical Therapy

## 2024-03-11 DIAGNOSIS — R2689 Other abnormalities of gait and mobility: Secondary | ICD-10-CM

## 2024-03-11 DIAGNOSIS — M6281 Muscle weakness (generalized): Secondary | ICD-10-CM

## 2024-03-11 NOTE — Therapy (Signed)
 OUTPATIENT PHYSICAL THERAPY FEMALE PELVIC TREATMENT     Patient Name: Kara Webster MRN: [id number] DOB:January 13, 1956, 68 y.o., female Today's Date: 02/25/2024   END OF SESSION:      PT End of Session - 03/11/24 1110     Visit Number 3    Date for Recertification  08/25/24    Authorization Type [employer] [id number]  no author req    Progress Note Due on Visit 10    PT Start Time 1108    PT Stop Time 1146    PT Time Calculation (min) 38 min    Activity Tolerance Patient tolerated treatment well    Behavior During Therapy WFL for tasks assessed/performed               Past Medical History:  Diagnosis Date   Bipolar 1 disorder (HCC)     Constipation      See's MD for issue and it is resolving per pt OK with issue for  a month now   Depression     Hearing loss     Hypothyroid     Rectocele     RLS (restless legs syndrome)     Squamous cell carcinoma of skin 10/18/2020    Mid Tip of Nose (in situ)             Past Surgical History:  Procedure Laterality Date   BLADDER SURGERY   1980    x2   COLONOSCOPY       TUBAL LIGATION                Patient Active Problem List    Diagnosis Date Noted   Bipolar 1 disorder, mixed, moderate (HCC) 05/04/2023   Incontinence of feces 04/24/2023   Urge urinary incontinence 02/19/2023   History of pelvic surgery 02/19/2023   Pelvic organ prolapse quantification stage 2 rectocele 02/19/2023   Bipolar I disorder with mania (HCC) 12/24/2020   Symptoms, such as flushing, sleeplessness, headache, lack of concentration, associated with the menopause 07/19/2015   Constipation 10/07/2013   Episodic mood disorder 05/28/2013   History of substance abuse (HCC) 05/28/2013      PCP: Dayna Motto, DO   REFERRING PROVIDER: Guadlupe Lianne DASEN, MD    REFERRING DIAG: N81.6 (ICD-10-CM) - Pelvic organ prolapse quantification stage 2 rectocele K59.00 (ICD-10-CM) - Constipation, unspecified constipation type R15.9 (ICD-10-CM) - Incontinence of feces,  unspecified fecal incontinence type N39.41 (ICD-10-CM) - Urge urinary incontinence   THERAPY DIAG: R26.89 Other abnormalities of gait and mobility                               M 62.81 Muscle weakness generalized     Rationale for Evaluation and Treatment: Rehabilitation   ONSET DATE: up to 5 years, worse in the last year   SUBJECTIVE:  SUBJECTIVE STATEMENT: Patient reports that she feels about the same Has been able to do some exercises- but not very much Has been very busy Tried to do exercises on the couch MRI showed nothing Constipation is fine, is regular, is surprised Drinks 3 glasses of water / day     Last  Patient had MRI yesterday Drinks water throughout the day Sips water Was not able to look at printout     From eval Patient reports that she had 2 bladder tacks long time ago- 40 years ago and 30 years ago. Now when she has to use the bathroom, she has to go, will leak, when she starts, she cannot stop.  Had constipation for years and now it is a lot better, she was taking different kinds of medications and she had problem with diarrhea.  Not feeing well in general, she has trouble remembering things, awaiting MRI. Sometimes exhausted and sleeps all day Taking medicine for depression and mood swings, can make her sleepy as well. 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 No PRECAUTIONS: None   RED FLAGS: None       WEIGHT BEARING RESTRICTIONS: No   FALLS:  Has patient fallen in last 6 months? no   OCCUPATION: retired   ACTIVITY LEVEL : sometimes walks her dog   PLOF: Independent, lives by herself   PATIENT GOALS: to be able to use the bathroom normally, not leak   PERTINENT  HISTORY:  Bladder surgery, tubal ligation, bipolar disorder, constipation, depression, hypothyroidism, rectocele grade 2 Sexual abuse: No   BOWEL MOVEMENT: Pain with bowel movement: No Type of bowel movement:Type (Bristol Stool Scale) 4 but varies, Frequency no, Strain yes, and Splinting yes sometimes ( rectocele) Fully empty rectum: No not always Leakage: Yes:  Urgency: No Pads: no Fiber supplement/laxative No   URINATION: Pain with urination: No Fully empty bladder: Yes:  Stream: Strong Urgency: No Frequency: every 30 mins, 1 hr- when she drinks anything, makes her pee Nocturia: 1  Fluid Intake: water- is not sure if 1-4 bottles (32 oz ) caffeine gives her too much urgency Leakage: Urge to void  Pads: Yes: 2-3   INTERCOURSE: not active               PREGNANCY: Vaginal deliveries 2 Tearing No Episiotomy No C-section deliveries 0 Currently pregnant No   PROLAPSE: Pressure sometimes     OBJECTIVE:  Note: Objective measures were completed at Evaluation unless otherwise noted.   02/25/24: PATIENT SURVEYS:    PFIQ-7: to be completed   COGNITION: Overall cognitive status: Within functional limits for tasks assessed                          SENSATION: Light touch: Appears intact     FUNCTIONAL TESTS:  Squat: pelvic sway bilat Single leg stance: bilateral hip dip             Rt: weakness             Lt: weakness Curl-up test: needs to use arms Sit-up test: needs to use arms  Active straight leg raise: positive for weakness     GAIT: Assistive device utilized: None Comments: WNL   POSTURE: rounded shoulders and forward head     LUMBARAROM/PROM: full     PALPATION: General: restrictions throughout  abdomen   Abdominal: weakness, unable to dem a crunch  Breathing: upper chest Tenderness: no Scar tissue: no Diastasis: no                 External Perineal Exam: dryness and decreased estrogen in tissues                             Internal Pelvic  Floor: able to lift a little, pelvic floor weakness present as well as anterior and posterior vaginal wall laxity   Patient confirms identification and approves PT to assess internal pelvic floor and treatment Yes   PELVIC MMT:   MMT eval  Vaginal 1/5, 3 s   Internal Anal Sphincter 3/5   External Anal Sphincter 2/5   Puborectalis    (Blank rows = not tested)         TONE: low   PROLAPSE: Anterior and posterior vaginal wall laxity   TODAY'S TREATMENT:                                                                                                                              DATE:  03/11/2024 Diaphragmatic breathing reed with theraband around lower ribs and relaxed shoulders (difficult)  Urge suppression strategies- education  Seated ball press with exhale 20 reps bilateral ( with theraband around lower ribs )   Hip adduction with ball with green theraband hold with exhale 10 reps Leg press with exhale- slow to engage her core better- difficult ( patient needs VC's and TC's) #55 and #75 Hip abduction machine 3 plates bilateral 20 reps VC's to breathe      03/04/2024 Review of progress Urge suppression strategies- education Seated ball press with exhale 20 reps bilateral Standing extensions with red theraband with exhale 10 reps  Hip adduction with ball with red theraband hold with exhale 10 reps Pick up #10 kb off bench with exhale ( significant VC's, TC's need for more optimal coordination with breath)     02/25/24 EVAL  Examination completed, findings reviewed, pt educated on POC, HEP, and female pelvic floor anatomy, reasoning with pelvic floor assessment internally with pt consent. Pt motivated to participate in PT and agreeable to attempt recommendations.            PATIENT EDUCATION/ there acts  Education details: Pt was educated on relevant anatomy, exam findings, home exercise program, plan of care, expectations of PT and bladder diary, healthy bladder  habits  Person educated: Patient Education method: Explanation, Demonstration, Tactile cues, Verbal cues, and Handouts Education comprehension: verbalized understanding   HOME EXERCISE PROGRAM: Access Code: [email or web address] URL: [email or web address] Date: 02/25/2024 Prepared by: Cori Greydon Betke  Patient Education - Urinary Urge Control Techniques - Bladder Log - Bowel Emptying Techniques   ASSESSMENT:   CLINICAL IMPRESSION: Patient was seen today for treatment of prolapse. Patient with difficulty with diaphragmatic breathing and needs reminders to relax shoulders, tends to  compensate. Has bilateral arm weakness. We discussed progress, HEP and recommended consistency. Treatment session focused on exercises for pressure management to improve prolapse. Patient had some difficulty with exhaling while exercising. Patient is progressing well towards goals and will benefit from continued PT to address deficits, reduce prolapse and urinary urgency and improve quality of life.          Last session Patient is a 68 y.o. female who was seen today for physical therapy treatment for pelvic organ prolapse and urinary incontinence as well as fecal incontinence. She did well with education and her initial exercises focusing on deep core engagement with pelvic floor lift. Fatigued a little during session and had a little difficulty with coordination with breathing. VC's for dropped shoulders needed. Patient with bilateral arm weakness and compensates with her breath. Helped patient download medbridge Go app for easier access to HEP. Patient present with global weakness. She is HOH. She will benefit from PT to address deficits   OBJECTIVE IMPAIRMENTS: decreased activity tolerance, decreased coordination, decreased endurance, decreased mobility, decreased ROM, decreased strength, increased fascial restrictions, increased muscle spasms, impaired flexibility, impaired tone, improper body mechanics,  postural dysfunction, and pain.    ACTIVITY LIMITATIONS: continence and toileting   PARTICIPATION LIMITATIONS: community activity   PERSONAL FACTORS: Behavior pattern and Time since onset of injury/illness/exacerbation are also affecting patient's functional outcome.    REHAB POTENTIAL: Good   CLINICAL DECISION MAKING: Evolving/moderate complexity   EVALUATION COMPLEXITY: Moderate     GOALS: Goals reviewed with patient? Yes   SHORT TERM GOALS: Target date: 03/24/2024     Pt will be independent with HEP in order to improve activity tolerance.    Baseline: Goal status: INITIAL   2.  Patient will be educated on healthy bladder habits   Baseline:  Goal status: INITIAL   3.  Patient will fill out bladder diary    Baseline:  Goal status: INITIAL   4.  Patient will be I with healthy bowel PT recommendations Baseline:  Goal status: INITIAL   5.  Patient will be educated on abdominal massage and cupping for her scar Baseline:  Goal status: INITIAL   LONG TERM GOALS: Target date: 05/19/2024     Pt will be independent with advanced HEP in order to improve activity tolerance.    Baseline:  Goal status: INITIAL   2.  Bilateral hip strength to 5/5 Baseline:  Goal status: INITIAL   3.  Patient will soak 0 pads/ day Baseline:  Goal status: INITIAL   4.  Patient will report voiding every 2 hrs Baseline:  Goal status: INITIAL   5.  Patient will demonstrate better pelvic floor strength to at least 4/5 MMT Baseline:  Goal status: INITIAL   6.  Patient will report 0 fecal incontinence episodes Baseline:  Goal status: INITIAL   PLAN:   PT FREQUENCY: 1-2x/week   PT DURATION: 3 months    PLANNED INTERVENTIONS: 97164- PT Re-evaluation, 97110-Therapeutic exercises, 97530- Therapeutic activity, 97112- Neuromuscular re-education, 97535- Self Care, 02859- Manual therapy, [phone]- Gait training, [phone]- Aquatic Therapy, [phone]- Electrical stimulation (unattended), [phone]-  Traction (mechanical), [id number]- Ionotophoresis 4mg /ml Dexamethasone , 79439 (1-2 muscles), 20561 (3+ muscles)- Dry Needling, Patient/Family education, Balance training, Taping, Joint mobilization, Joint manipulation, Spinal manipulation, Spinal mobilization, Scar mobilization, Vestibular training, Cryotherapy, Moist heat, and Biofeedback   PLAN FOR NEXT SESSION: continue arm strengthening, global strengthening, pressure management exercises with exhale, review urge suppression  Gino Garrabrant, PT, DPT  [facility] [address],  [address] [place], [place] [address] Phone # [phone] Fax [phone]

## 2024-03-21 ENCOUNTER — Other Ambulatory Visit: Payer: Self-pay | Admitting: Nurse Practitioner

## 2024-03-24 ENCOUNTER — Ambulatory Visit: Attending: Obstetrics | Admitting: Physical Therapy

## 2024-03-24 DIAGNOSIS — R2689 Other abnormalities of gait and mobility: Secondary | ICD-10-CM | POA: Diagnosis present

## 2024-03-24 DIAGNOSIS — M6281 Muscle weakness (generalized): Secondary | ICD-10-CM | POA: Diagnosis present

## 2024-03-24 NOTE — Patient Instructions (Signed)
  15-20 mins in the evenings based on tolerance.  Please clear any positions with doctor as needed.  Please stop any position if negative symptoms occur (dizziness/lightheadedness/fatigue/increased pain, etc.)  

## 2024-03-24 NOTE — Therapy (Signed)
 OUTPATIENT PHYSICAL THERAPY FEMALE PELVIC TREATMENT     Patient Name: Kara Webster MRN: [id number] DOB:[birth year], 69 y.o., female Today's Date: 02/25/2024   END OF SESSION:      PT End of Session - 03/24/24 1204     Visit Number 4    Date for Recertification  08/25/24    Authorization Type [employer] [id number]  no author req    Progress Note Due on Visit 10    PT Start Time 1204   arrival   PT Stop Time 1229    PT Time Calculation (min) 25 min    Activity Tolerance Patient tolerated treatment well    Behavior During Therapy WFL for tasks assessed/performed                Past Medical History:  Diagnosis Date   Bipolar 1 disorder (HCC)     Constipation      See's MD for issue and it is resolving per pt OK with issue for  a month now   Depression     Hearing loss     Hypothyroid     Rectocele     RLS (restless legs syndrome)     Squamous cell carcinoma of skin 10/18/2020    Mid Tip of Nose (in situ)             Past Surgical History:  Procedure Laterality Date   BLADDER SURGERY   1980    x2   COLONOSCOPY       TUBAL LIGATION                Patient Active Problem List    Diagnosis Date Noted   Bipolar 1 disorder, mixed, moderate (HCC) 05/04/2023   Incontinence of feces 04/24/2023   Urge urinary incontinence 02/19/2023   History of pelvic surgery 02/19/2023   Pelvic organ prolapse quantification stage 2 rectocele 02/19/2023   Bipolar I disorder with mania (HCC) 12/24/2020   Symptoms, such as flushing, sleeplessness, headache, lack of concentration, associated with the menopause 07/19/2015   Constipation 10/07/2013   Episodic mood disorder 05/28/2013   History of substance abuse (HCC) 05/28/2013      PCP: Dayna Motto, DO   REFERRING PROVIDER: Guadlupe Lianne DASEN, MD    REFERRING DIAG: N81.6 (ICD-10-CM) - Pelvic organ prolapse quantification stage 2 rectocele K59.00 (ICD-10-CM) - Constipation, unspecified constipation type R15.9 (ICD-10-CM) - Incontinence of  feces, unspecified fecal incontinence type N39.41 (ICD-10-CM) - Urge urinary incontinence   THERAPY DIAG: R26.89 Other abnormalities of gait and mobility                               M 62.81 Muscle weakness generalized     Rationale for Evaluation and Treatment: Rehabilitation   ONSET DATE: up to 5 years, worse in the last year   SUBJECTIVE:  SUBJECTIVE STATEMENT: Reports she has started leaking now and wasn't prior to PT. The urgency is has been there and not improving. Constipation is improving. No fecal leakage. Pelvic heaviness still present but not sure if constant but often. Doesn't feel worse.  Caffeine has been found to be a trigger for leakage and urge. Is now wearing panty liner every day and sometimes changing mid day. Wakes dry.     Last:  Patient reports that she feels about the same Has been able to do some exercises- but not very much Has been very busy Tried to do exercises on the couch MRI showed nothing Constipation is fine, is regular, is surprised Drinks 3 glasses of water / day   From eval Patient reports that she had 2 bladder tacks long time ago- 40 years ago and 30 years ago. Now when she has to use the bathroom, she has to go, will leak, when she starts, she cannot stop.  Had constipation for years and now it is a lot better, she was taking different kinds of medications and she had problem with diarrhea.  Not feeing well in general, she has trouble remembering things, awaiting MRI. Sometimes exhausted and sleeps all day Taking medicine for depression and mood swings, can make her sleepy as well. 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 No PRECAUTIONS: None   RED FLAGS: None        WEIGHT BEARING RESTRICTIONS: No   FALLS:  Has patient fallen in last 6 months? no   OCCUPATION: retired   ACTIVITY LEVEL : sometimes walks her dog   PLOF: Independent, lives by herself   PATIENT GOALS: to be able to use the bathroom normally, not leak   PERTINENT HISTORY:  Bladder surgery, tubal ligation, bipolar disorder, constipation, depression, hypothyroidism, rectocele grade 2 Sexual abuse: No   BOWEL MOVEMENT: Pain with bowel movement: No Type of bowel movement:Type (Bristol Stool Scale) 4 but varies, Frequency no, Strain yes, and Splinting yes sometimes ( rectocele) Fully empty rectum: No not always Leakage: Yes:  Urgency: No Pads: no Fiber supplement/laxative No   URINATION: Pain with urination: No Fully empty bladder: Yes:  Stream: Strong Urgency: No Frequency: every 30 mins, 1 hr- when she drinks anything, makes her pee Nocturia: 1  Fluid Intake: water- is not sure if 1-4 bottles (32 oz ) caffeine gives her too much urgency Leakage: Urge to void  Pads: Yes: 2-3   INTERCOURSE: not active               PREGNANCY: Vaginal deliveries 2 Tearing No Episiotomy No C-section deliveries 0 Currently pregnant No   PROLAPSE: Pressure sometimes     OBJECTIVE:  Note: Objective measures were completed at Evaluation unless otherwise noted.   02/25/24: PATIENT SURVEYS:    PFIQ-7: to be completed   COGNITION: Overall cognitive status: Within functional limits for tasks assessed                          SENSATION: Light touch: Appears intact     FUNCTIONAL TESTS:  Squat: pelvic sway bilat Single leg stance: bilateral hip dip             Rt: weakness             Lt: weakness Curl-up test: needs to use arms  Sit-up test: needs to use arms  Active straight leg raise: positive for weakness     GAIT: Assistive device utilized: None Comments: WNL   POSTURE: rounded shoulders and forward head     LUMBARAROM/PROM: full     PALPATION: General:  restrictions throughout abdomen   Abdominal: weakness, 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  Breathing: upper chest Tenderness: no Scar tissue: no Diastasis: no                 External Perineal Exam: dryness and decreased estrogen in tissues                             Internal Pelvic Floor: able to lift a little, pelvic floor weakness present as well as anterior and posterior vaginal wall laxity   Patient confirms identification and approves PT to assess internal pelvic floor and treatment Yes   PELVIC MMT:   MMT eval  Vaginal 1/5, 3 s   Internal Anal Sphincter 3/5   External Anal Sphincter 2/5   Puborectalis    (Blank rows = not tested)         TONE: low   PROLAPSE: Anterior and posterior vaginal wall laxity   TODAY'S TREATMENT:                                                                                                                              DATE:  03/24/24: Pt educated on pelvic props for decreased heaviness and laxity sensation and hand out given Pt also educated to exhale with all force/effort as able Urge drill and irritants reviewed, knack educated on  Manual with fascial release in lower abdomen pt in supine with improved tissue mobility noted with direct release techniques and cues for diaphragmatic breathing and decreased clenching.   03/11/2024 Diaphragmatic breathing reed with theraband around lower ribs and relaxed shoulders (difficult)  Urge suppression strategies- education  Seated ball press with exhale 20 reps bilateral ( with theraband around lower ribs )   Hip adduction with ball with green theraband hold with exhale 10 reps Leg press with exhale- slow to engage her core better- difficult ( patient needs VC's and TC's) #55 and #75 Hip abduction machine 3 plates bilateral 20 reps VC's to breathe      03/04/2024 Review of progress Urge suppression strategies- education Seated ball press with exhale 20 reps bilateral Standing extensions with red  theraband with exhale 10 reps  Hip adduction with ball with red theraband hold with exhale 10 reps Pick up #10 kb off bench with exhale ( significant VC's, TC's need for more optimal coordination with breath)     02/25/24 EVAL  Examination completed, findings reviewed, pt educated on POC, HEP, and female pelvic floor anatomy, reasoning with pelvic floor assessment internally with pt consent. Pt motivated to participate in PT and agreeable to attempt recommendations.  PATIENT EDUCATION/ there acts  Education details: Pt was educated on relevant anatomy, exam findings, home exercise program, plan of care, expectations of PT and bladder diary, healthy bladder habits  Person educated: Patient Education method: Explanation, Demonstration, Tactile cues, Verbal cues, and Handouts Education comprehension: verbalized understanding   HOME EXERCISE PROGRAM: Access Code: [email or web address] URL: [email or web address] Date: 02/25/2024 Prepared by: Cori Helmus  Patient Education - Urinary Urge Control Techniques - Bladder Log - Bowel Emptying Techniques   ASSESSMENT:   CLINICAL IMPRESSION: Patient presents for treatment for skilled PT, states she is still having symptoms but constipation is doing much better. Session limited with pt arriving late to appointment but very motivated to participate. Educated on pelvic propping for attempting to relieve pelvic heaviness, knack as needed as pt unsure what is causing her leakage. Pt tolerated manual well without pain and did have tension throughout lower abdomen and released well.       Last session Patient is a 69 y.o. female who was seen today for physical therapy treatment for pelvic organ prolapse and urinary incontinence as well as fecal incontinence. She did well with education and her initial exercises focusing on deep core engagement with pelvic floor lift. Fatigued a little during session and had a little difficulty with  coordination with breathing. VC's for dropped shoulders needed. Patient with bilateral arm weakness and compensates with her breath. Helped patient download medbridge Go app for easier access to HEP. Patient present with global weakness. She is HOH. She will benefit from PT to address deficits   OBJECTIVE IMPAIRMENTS: decreased activity tolerance, decreased coordination, decreased endurance, decreased mobility, decreased ROM, decreased strength, increased fascial restrictions, increased muscle spasms, impaired flexibility, impaired tone, improper body mechanics, postural dysfunction, and pain.    ACTIVITY LIMITATIONS: continence and toileting   PARTICIPATION LIMITATIONS: community activity   PERSONAL FACTORS: Behavior pattern and Time since onset of injury/illness/exacerbation are also affecting patient's functional outcome.    REHAB POTENTIAL: Good   CLINICAL DECISION MAKING: Evolving/moderate complexity   EVALUATION COMPLEXITY: Moderate     GOALS: Goals reviewed with patient? Yes   SHORT TERM GOALS: Target date: 03/24/2024     Pt will be independent with HEP in order to improve activity tolerance.    Baseline: Goal status: INITIAL   2.  Patient will be educated on healthy bladder habits   Baseline:  Goal status: INITIAL   3.  Patient will fill out bladder diary    Baseline:  Goal status: INITIAL   4.  Patient will be I with healthy bowel PT recommendations Baseline:  Goal status: INITIAL   5.  Patient will be educated on abdominal massage and cupping for her scar Baseline:  Goal status: INITIAL   LONG TERM GOALS: Target date: 05/19/2024     Pt will be independent with advanced HEP in order to improve activity tolerance.    Baseline:  Goal status: INITIAL   2.  Bilateral hip strength to 5/5 Baseline:  Goal status: INITIAL   3.  Patient will soak 0 pads/ day Baseline:  Goal status: INITIAL   4.  Patient will report voiding every 2 hrs Baseline:  Goal  status: INITIAL   5.  Patient will demonstrate better pelvic floor strength to at least 4/5 MMT Baseline:  Goal status: INITIAL   6.  Patient will report 0 fecal incontinence episodes Baseline:  Goal status: INITIAL   PLAN:   PT FREQUENCY: 1-2x/week   PT DURATION: 3 months    PLANNED INTERVENTIONS:  02835- PT Re-evaluation, 97110-Therapeutic exercises, 97530- Therapeutic activity, [id number]- Neuromuscular re-education, [phone]- Self Care, 02859- Manual therapy, [phone]- Gait training, [phone]- Aquatic Therapy, [phone]- Electrical stimulation (unattended), [phone]- Traction (mechanical), [id number]- Ionotophoresis 4mg /ml Dexamethasone , 79439 (1-2 muscles), 20561 (3+ muscles)- Dry Needling, Patient/Family education, Balance training, Taping, Joint mobilization, Joint manipulation, Spinal manipulation, Spinal mobilization, Scar mobilization, Vestibular training, Cryotherapy, Moist heat, and Biofeedback   PLAN FOR NEXT SESSION: continue arm strengthening, global strengthening, pressure management exercises with exhale, review urge suppression  Darryle Navy, PT, DPT 1/5/20262:06 PM  [facility] [address] [place], [place] [address] Phone # [phone] Fax [phone]

## 2024-04-03 ENCOUNTER — Ambulatory Visit: Admitting: Physical Therapy

## 2024-04-04 NOTE — Therapy (Signed)
 OUTPATIENT PHYSICAL THERAPY FEMALE PELVIC TREATMENT     Patient Name: Kara Webster MRN: [id number] DOB:1955/07/30, 69 y.o., female Today's Date: 02/25/2024   END OF SESSION:              Past Medical History:  Diagnosis Date   Bipolar 1 disorder (HCC)     Constipation      See's MD for issue and it is resolving per pt OK with issue for  a month now   Depression     Hearing loss     Hypothyroid     Rectocele     RLS (restless legs syndrome)     Squamous cell carcinoma of skin 10/18/2020    Mid Tip of Nose (in situ)             Past Surgical History:  Procedure Laterality Date   BLADDER SURGERY   1980    x2   COLONOSCOPY       TUBAL LIGATION                Patient Active Problem List    Diagnosis Date Noted   Bipolar 1 disorder, mixed, moderate (HCC) 05/04/2023   Incontinence of feces 04/24/2023   Urge urinary incontinence 02/19/2023   History of pelvic surgery 02/19/2023   Pelvic organ prolapse quantification stage 2 rectocele 02/19/2023   Bipolar I disorder with mania (HCC) 12/24/2020   Symptoms, such as flushing, sleeplessness, headache, lack of concentration, associated with the menopause 07/19/2015   Constipation 10/07/2013   Episodic mood disorder 05/28/2013   History of substance abuse (HCC) 05/28/2013      PCP: Dayna Motto, DO   REFERRING PROVIDER: Guadlupe Lianne DASEN, MD    REFERRING DIAG: N81.6 (ICD-10-CM) - Pelvic organ prolapse quantification stage 2 rectocele K59.00 (ICD-10-CM) - Constipation, unspecified constipation type R15.9 (ICD-10-CM) - Incontinence of feces, unspecified fecal incontinence type N39.41 (ICD-10-CM) - Urge urinary incontinence   THERAPY DIAG: R26.89 Other abnormalities of gait and mobility                               M 62.81 Muscle weakness generalized     Rationale for Evaluation and Treatment: Rehabilitation   ONSET DATE: up to 5 years, worse in the last year   SUBJECTIVE:                                                                                                                                                                                             SUBJECTIVE STATEMENT: Reports she has started leaking now and wasn't  prior to PT. The urgency is has been there and not improving. Constipation is improving. No fecal leakage. Pelvic heaviness still present but not sure if constant but often. Doesn't feel worse.  Caffeine has been found to be a trigger for leakage and urge. Is now wearing panty liner every day and sometimes changing mid day. Wakes dry.     Last:  Patient reports that she feels about the same Has been able to do some exercises- but not very much Has been very busy Tried to do exercises on the couch MRI showed nothing Constipation is fine, is regular, is surprised Drinks 3 glasses of water / day   From eval Patient reports that she had 2 bladder tacks long time ago- 40 years ago and 30 years ago. Now when she has to use the bathroom, she has to go, will leak, when she starts, she cannot stop.  Had constipation for years and now it is a lot better, she was taking different kinds of medications and she had problem with diarrhea.  Not feeing well in general, she has trouble remembering things, awaiting MRI. Sometimes exhausted and sleeps all day Taking medicine for depression and mood swings, can make her sleepy as well. 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 No PRECAUTIONS: None   RED FLAGS: None       WEIGHT BEARING RESTRICTIONS: No   FALLS:  Has patient fallen in last 6 months? no   OCCUPATION: retired   ACTIVITY LEVEL : sometimes walks her dog   PLOF: Independent, lives by herself   PATIENT GOALS: to be able to use the bathroom normally, not leak   PERTINENT HISTORY:  Bladder surgery, tubal ligation, bipolar disorder, constipation, depression, hypothyroidism, rectocele grade  2 Sexual abuse: No   BOWEL MOVEMENT: Pain with bowel movement: No Type of bowel movement:Type (Bristol Stool Scale) 4 but varies, Frequency no, Strain yes, and Splinting yes sometimes ( rectocele) Fully empty rectum: No not always Leakage: Yes:  Urgency: No Pads: no Fiber supplement/laxative No   URINATION: Pain with urination: No Fully empty bladder: Yes:  Stream: Strong Urgency: No Frequency: every 30 mins, 1 hr- when she drinks anything, makes her pee Nocturia: 1  Fluid Intake: water- is not sure if 1-4 bottles (32 oz ) caffeine gives her too much urgency Leakage: Urge to void  Pads: Yes: 2-3   INTERCOURSE: not active               PREGNANCY: Vaginal deliveries 2 Tearing No Episiotomy No C-section deliveries 0 Currently pregnant No   PROLAPSE: Pressure sometimes     OBJECTIVE:  Note: Objective measures were completed at Evaluation unless otherwise noted.   02/25/24: PATIENT SURVEYS:    PFIQ-7: to be completed   COGNITION: Overall cognitive status: Within functional limits for tasks assessed                          SENSATION: Light touch: Appears intact     FUNCTIONAL TESTS:  Squat: pelvic sway bilat Single leg stance: bilateral hip dip             Rt: weakness             Lt: weakness Curl-up test: needs to use arms Sit-up test: needs to use arms  Active straight leg  raise: positive for weakness     GAIT: Assistive device utilized: None Comments: WNL   POSTURE: rounded shoulders and forward head     LUMBARAROM/PROM: full     PALPATION: General: restrictions throughout abdomen   Abdominal: weakness, 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  Breathing: upper chest Tenderness: no Scar tissue: no Diastasis: no                 External Perineal Exam: dryness and decreased estrogen in tissues                             Internal Pelvic Floor: able to lift a little, pelvic floor weakness present as well as anterior and posterior vaginal wall laxity    Patient confirms identification and approves PT to assess internal pelvic floor and treatment Yes   PELVIC MMT:   MMT eval  Vaginal 1/5, 3 s   Internal Anal Sphincter 3/5   External Anal Sphincter 2/5   Puborectalis    (Blank rows = not tested)         TONE: low   PROLAPSE: Anterior and posterior vaginal wall laxity   TODAY'S TREATMENT:                                                                                                                              DATE:  03/24/24: Pt educated on pelvic props for decreased heaviness and laxity sensation and hand out given Pt also educated to exhale with all force/effort as able Urge drill and irritants reviewed, knack educated on  Manual with fascial release in lower abdomen pt in supine with improved tissue mobility noted with direct release techniques and cues for diaphragmatic breathing and decreased clenching.   03/11/2024 Diaphragmatic breathing reed with theraband around lower ribs and relaxed shoulders (difficult)  Urge suppression strategies- education  Seated ball press with exhale 20 reps bilateral ( with theraband around lower ribs )   Hip adduction with ball with green theraband hold with exhale 10 reps Leg press with exhale- slow to engage her core better- difficult ( patient needs VC's and TC's) #55 and #75 Hip abduction machine 3 plates bilateral 20 reps VC's to breathe      03/04/2024 Review of progress Urge suppression strategies- education Seated ball press with exhale 20 reps bilateral Standing extensions with red theraband with exhale 10 reps  Hip adduction with ball with red theraband hold with exhale 10 reps Pick up #10 kb off bench with exhale ( significant VC's, TC's need for more optimal coordination with breath)     02/25/24 EVAL  Examination completed, findings reviewed, pt educated on POC, HEP, and female pelvic floor anatomy, reasoning with pelvic floor assessment internally with pt consent.  Pt motivated to participate in PT and agreeable to attempt recommendations.            PATIENT EDUCATION/ there acts  Education details:  Pt was educated on relevant anatomy, exam findings, home exercise program, plan of care, expectations of PT and bladder diary, healthy bladder habits  Person educated: Patient Education method: Explanation, Demonstration, Tactile cues, Verbal cues, and Handouts Education comprehension: verbalized understanding   HOME EXERCISE PROGRAM: Access Code: [email or web address] URL: [email or web address] Date: 02/25/2024 Prepared by: Cori Nishita Isaacks  Patient Education - Urinary Urge Control Techniques - Bladder Log - Bowel Emptying Techniques   ASSESSMENT:   CLINICAL IMPRESSION: Patient presents for treatment for skilled PT, states she is still having symptoms but constipation is doing much better. Session limited with pt arriving late to appointment but very motivated to participate. Educated on pelvic propping for attempting to relieve pelvic heaviness, knack as needed as pt unsure what is causing her leakage. Pt tolerated manual well without pain and did have tension throughout lower abdomen and released well.       Last session Patient is a 69 y.o. female who was seen today for physical therapy treatment for pelvic organ prolapse and urinary incontinence as well as fecal incontinence. She did well with education and her initial exercises focusing on deep core engagement with pelvic floor lift. Fatigued a little during session and had a little difficulty with coordination with breathing. VC's for dropped shoulders needed. Patient with bilateral arm weakness and compensates with her breath. Helped patient download medbridge Go app for easier access to HEP. Patient present with global weakness. She is HOH. She will benefit from PT to address deficits   OBJECTIVE IMPAIRMENTS: decreased activity tolerance, decreased coordination, decreased endurance,  decreased mobility, decreased ROM, decreased strength, increased fascial restrictions, increased muscle spasms, impaired flexibility, impaired tone, improper body mechanics, postural dysfunction, and pain.    ACTIVITY LIMITATIONS: continence and toileting   PARTICIPATION LIMITATIONS: community activity   PERSONAL FACTORS: Behavior pattern and Time since onset of injury/illness/exacerbation are also affecting patient's functional outcome.    REHAB POTENTIAL: Good   CLINICAL DECISION MAKING: Evolving/moderate complexity   EVALUATION COMPLEXITY: Moderate     GOALS: Goals reviewed with patient? Yes   SHORT TERM GOALS: Target date: 03/24/2024     Pt will be independent with HEP in order to improve activity tolerance.    Baseline: Goal status: INITIAL   2.  Patient will be educated on healthy bladder habits   Baseline:  Goal status: INITIAL   3.  Patient will fill out bladder diary    Baseline:  Goal status: INITIAL   4.  Patient will be I with healthy bowel PT recommendations Baseline:  Goal status: INITIAL   5.  Patient will be educated on abdominal massage and cupping for her scar Baseline:  Goal status: INITIAL   LONG TERM GOALS: Target date: 05/19/2024     Pt will be independent with advanced HEP in order to improve activity tolerance.    Baseline:  Goal status: INITIAL   2.  Bilateral hip strength to 5/5 Baseline:  Goal status: INITIAL   3.  Patient will soak 0 pads/ day Baseline:  Goal status: INITIAL   4.  Patient will report voiding every 2 hrs Baseline:  Goal status: INITIAL   5.  Patient will demonstrate better pelvic floor strength to at least 4/5 MMT Baseline:  Goal status: INITIAL   6.  Patient will report 0 fecal incontinence episodes Baseline:  Goal status: INITIAL   PLAN:   PT FREQUENCY: 1-2x/week   PT DURATION: 3 months    PLANNED INTERVENTIONS: 97164- PT Re-evaluation, 97110-Therapeutic exercises, 97530- Therapeutic activity,  02887- Neuromuscular re-education, [phone]- Self Care, 02859- Manual therapy, [phone]- Gait training, [phone]- Aquatic Therapy, [phone]- Electrical stimulation (unattended), [phone]- Traction (mechanical), [id number]- Ionotophoresis 4mg /ml Dexamethasone , 20560 (1-2 muscles), 20561 (3+ muscles)- Dry Needling, Patient/Family education, Balance training, Taping, Joint mobilization, Joint manipulation, Spinal manipulation, Spinal mobilization, Scar mobilization, Vestibular training, Cryotherapy, Moist heat, and Biofeedback   PLAN FOR NEXT SESSION: continue arm strengthening, global strengthening, pressure management exercises with exhale, review urge suppression  Rosa Wyly, PT, DPT 04/04/2608:14 AM  [facility] [address], [address] [place], [place] [address] Phone # [phone] Fax [phone]

## 2024-04-07 ENCOUNTER — Ambulatory Visit: Admitting: Physical Therapy

## 2024-04-07 ENCOUNTER — Encounter: Payer: Self-pay | Admitting: Physical Therapy

## 2024-04-07 DIAGNOSIS — M6281 Muscle weakness (generalized): Secondary | ICD-10-CM | POA: Diagnosis not present

## 2024-04-07 DIAGNOSIS — R2689 Other abnormalities of gait and mobility: Secondary | ICD-10-CM

## 2024-05-23 ENCOUNTER — Ambulatory Visit: Attending: Obstetrics | Admitting: Physical Therapy

## 2024-05-27 ENCOUNTER — Ambulatory Visit: Admitting: Physical Therapy

## 2024-06-02 ENCOUNTER — Ambulatory Visit: Admitting: Physical Therapy

## 2024-07-09 ENCOUNTER — Ambulatory Visit: Admitting: Diagnostic Neuroimaging
